# Patient Record
Sex: Female | Born: 1965 | Race: Black or African American | Hispanic: No | Marital: Single | Smoking: Current some day smoker
Health system: Southern US, Community
[De-identification: ages and names within clinical notes are randomized; demographics above are authoritative.]

## PROBLEM LIST (undated history)

## (undated) ENCOUNTER — Ambulatory Visit: Discharge status: Absent without leave | Payer: Medicare Other

## (undated) DIAGNOSIS — M1990 Unspecified osteoarthritis, unspecified site: Secondary | ICD-10-CM

## (undated) DIAGNOSIS — G8929 Other chronic pain: Secondary | ICD-10-CM

## (undated) DIAGNOSIS — G4700 Insomnia, unspecified: Secondary | ICD-10-CM

## (undated) DIAGNOSIS — R569 Unspecified convulsions: Secondary | ICD-10-CM

## (undated) DIAGNOSIS — J40 Bronchitis, not specified as acute or chronic: Secondary | ICD-10-CM

## (undated) DIAGNOSIS — R040 Epistaxis: Secondary | ICD-10-CM

## (undated) DIAGNOSIS — I10 Essential (primary) hypertension: Secondary | ICD-10-CM

## (undated) DIAGNOSIS — M25519 Pain in unspecified shoulder: Secondary | ICD-10-CM

## (undated) DIAGNOSIS — J45909 Unspecified asthma, uncomplicated: Secondary | ICD-10-CM

## (undated) HISTORY — PX: KNEE SURGERY: SHX244

## (undated) HISTORY — DX: Unspecified convulsions: R56.9

## (undated) HISTORY — PX: BUNIONECTOMY: SHX129

## (undated) HISTORY — PX: ABDOMINAL HYSTERECTOMY: SHX81

## (undated) HISTORY — DX: Insomnia, unspecified: G47.00

## (undated) HISTORY — PX: FOOT SURGERY: SHX648

## (undated) HISTORY — PX: TUMOR REMOVAL: SHX12

## (undated) HISTORY — PX: SHOULDER ARTHROSCOPY: SHX128

---

## 2001-06-11 ENCOUNTER — Encounter: Payer: Self-pay | Admitting: Emergency Medicine

## 2001-06-11 ENCOUNTER — Emergency Department (HOSPITAL_COMMUNITY): Admission: EM | Admit: 2001-06-11 | Discharge: 2001-06-11 | Payer: Self-pay | Admitting: Emergency Medicine

## 2001-12-02 ENCOUNTER — Encounter: Payer: Self-pay | Admitting: Emergency Medicine

## 2001-12-02 ENCOUNTER — Emergency Department (HOSPITAL_COMMUNITY): Admission: EM | Admit: 2001-12-02 | Discharge: 2001-12-02 | Payer: Self-pay | Admitting: Emergency Medicine

## 2002-01-04 ENCOUNTER — Emergency Department (HOSPITAL_COMMUNITY): Admission: EM | Admit: 2002-01-04 | Discharge: 2002-01-04 | Payer: Self-pay | Admitting: *Deleted

## 2002-05-05 ENCOUNTER — Emergency Department (HOSPITAL_COMMUNITY): Admission: EM | Admit: 2002-05-05 | Discharge: 2002-05-06 | Payer: Self-pay | Admitting: *Deleted

## 2002-05-06 ENCOUNTER — Encounter: Payer: Self-pay | Admitting: *Deleted

## 2002-10-03 ENCOUNTER — Emergency Department (HOSPITAL_COMMUNITY): Admission: EM | Admit: 2002-10-03 | Discharge: 2002-10-03 | Payer: Self-pay | Admitting: Emergency Medicine

## 2002-10-03 ENCOUNTER — Encounter: Payer: Self-pay | Admitting: Emergency Medicine

## 2002-10-18 ENCOUNTER — Emergency Department (HOSPITAL_COMMUNITY): Admission: EM | Admit: 2002-10-18 | Discharge: 2002-10-18 | Payer: Self-pay | Admitting: Emergency Medicine

## 2003-03-10 ENCOUNTER — Emergency Department (HOSPITAL_COMMUNITY): Admission: EM | Admit: 2003-03-10 | Discharge: 2003-03-10 | Payer: Self-pay | Admitting: Emergency Medicine

## 2004-04-20 ENCOUNTER — Emergency Department (HOSPITAL_COMMUNITY): Admission: EM | Admit: 2004-04-20 | Discharge: 2004-04-20 | Payer: Self-pay | Admitting: Emergency Medicine

## 2004-05-07 ENCOUNTER — Emergency Department (HOSPITAL_COMMUNITY): Admission: EM | Admit: 2004-05-07 | Discharge: 2004-05-07 | Payer: Self-pay | Admitting: Emergency Medicine

## 2004-09-15 ENCOUNTER — Emergency Department (HOSPITAL_COMMUNITY): Admission: EM | Admit: 2004-09-15 | Discharge: 2004-09-15 | Payer: Self-pay | Admitting: Emergency Medicine

## 2004-12-27 ENCOUNTER — Emergency Department (HOSPITAL_COMMUNITY): Admission: EM | Admit: 2004-12-27 | Discharge: 2004-12-27 | Payer: Self-pay | Admitting: Emergency Medicine

## 2005-04-28 ENCOUNTER — Emergency Department (HOSPITAL_COMMUNITY): Admission: EM | Admit: 2005-04-28 | Discharge: 2005-04-28 | Payer: Self-pay | Admitting: Emergency Medicine

## 2005-08-29 ENCOUNTER — Emergency Department (HOSPITAL_COMMUNITY): Admission: EM | Admit: 2005-08-29 | Discharge: 2005-08-29 | Payer: Self-pay | Admitting: Emergency Medicine

## 2006-04-10 ENCOUNTER — Emergency Department (HOSPITAL_COMMUNITY): Admission: EM | Admit: 2006-04-10 | Discharge: 2006-04-10 | Payer: Self-pay | Admitting: Emergency Medicine

## 2006-04-12 ENCOUNTER — Emergency Department (HOSPITAL_COMMUNITY): Admission: EM | Admit: 2006-04-12 | Discharge: 2006-04-12 | Payer: Self-pay | Admitting: Emergency Medicine

## 2006-06-14 ENCOUNTER — Emergency Department (HOSPITAL_COMMUNITY): Admission: EM | Admit: 2006-06-14 | Discharge: 2006-06-14 | Payer: Self-pay | Admitting: Emergency Medicine

## 2006-09-24 ENCOUNTER — Emergency Department (HOSPITAL_COMMUNITY): Admission: EM | Admit: 2006-09-24 | Discharge: 2006-09-24 | Payer: Self-pay | Admitting: Emergency Medicine

## 2006-09-26 ENCOUNTER — Emergency Department (HOSPITAL_COMMUNITY): Admission: EM | Admit: 2006-09-26 | Discharge: 2006-09-26 | Payer: Self-pay | Admitting: Emergency Medicine

## 2007-12-17 ENCOUNTER — Emergency Department (HOSPITAL_COMMUNITY): Admission: EM | Admit: 2007-12-17 | Discharge: 2007-12-17 | Payer: Self-pay | Admitting: Emergency Medicine

## 2008-01-19 ENCOUNTER — Encounter: Payer: Self-pay | Admitting: Orthopedic Surgery

## 2008-01-19 ENCOUNTER — Emergency Department (HOSPITAL_COMMUNITY): Admission: EM | Admit: 2008-01-19 | Discharge: 2008-01-19 | Payer: Self-pay | Admitting: Emergency Medicine

## 2008-01-23 ENCOUNTER — Telehealth: Payer: Self-pay | Admitting: Orthopedic Surgery

## 2008-01-23 ENCOUNTER — Ambulatory Visit: Payer: Self-pay | Admitting: Orthopedic Surgery

## 2008-01-23 DIAGNOSIS — M25469 Effusion, unspecified knee: Secondary | ICD-10-CM

## 2008-01-23 DIAGNOSIS — IMO0002 Reserved for concepts with insufficient information to code with codable children: Secondary | ICD-10-CM

## 2008-01-23 DIAGNOSIS — M23302 Other meniscus derangements, unspecified lateral meniscus, unspecified knee: Secondary | ICD-10-CM | POA: Insufficient documentation

## 2008-01-28 ENCOUNTER — Ambulatory Visit (HOSPITAL_COMMUNITY): Admission: RE | Admit: 2008-01-28 | Discharge: 2008-01-28 | Payer: Self-pay | Admitting: Orthopedic Surgery

## 2008-02-03 ENCOUNTER — Ambulatory Visit: Payer: Self-pay | Admitting: Orthopedic Surgery

## 2008-02-03 DIAGNOSIS — M545 Low back pain, unspecified: Secondary | ICD-10-CM | POA: Insufficient documentation

## 2008-02-03 DIAGNOSIS — D492 Neoplasm of unspecified behavior of bone, soft tissue, and skin: Secondary | ICD-10-CM | POA: Insufficient documentation

## 2008-02-04 ENCOUNTER — Ambulatory Visit (HOSPITAL_COMMUNITY): Admission: RE | Admit: 2008-02-04 | Discharge: 2008-02-04 | Payer: Self-pay | Admitting: Family Medicine

## 2008-02-07 ENCOUNTER — Ambulatory Visit (HOSPITAL_COMMUNITY): Admission: RE | Admit: 2008-02-07 | Discharge: 2008-02-07 | Payer: Self-pay | Admitting: Orthopedic Surgery

## 2008-02-07 ENCOUNTER — Encounter: Payer: Self-pay | Admitting: Orthopedic Surgery

## 2008-02-07 ENCOUNTER — Ambulatory Visit: Payer: Self-pay | Admitting: Orthopedic Surgery

## 2008-02-11 ENCOUNTER — Encounter (HOSPITAL_COMMUNITY): Admission: RE | Admit: 2008-02-11 | Discharge: 2008-03-12 | Payer: Self-pay | Admitting: Orthopedic Surgery

## 2008-02-11 ENCOUNTER — Ambulatory Visit: Payer: Self-pay | Admitting: Orthopedic Surgery

## 2008-02-11 DIAGNOSIS — M1710 Unilateral primary osteoarthritis, unspecified knee: Secondary | ICD-10-CM

## 2008-02-11 DIAGNOSIS — IMO0002 Reserved for concepts with insufficient information to code with codable children: Secondary | ICD-10-CM | POA: Insufficient documentation

## 2008-02-12 ENCOUNTER — Encounter: Payer: Self-pay | Admitting: Orthopedic Surgery

## 2008-02-17 ENCOUNTER — Ambulatory Visit (HOSPITAL_COMMUNITY): Admission: RE | Admit: 2008-02-17 | Discharge: 2008-02-17 | Payer: Self-pay | Admitting: Family Medicine

## 2008-02-19 ENCOUNTER — Ambulatory Visit: Payer: Self-pay | Admitting: Orthopedic Surgery

## 2008-02-25 ENCOUNTER — Telehealth: Payer: Self-pay | Admitting: Orthopedic Surgery

## 2008-03-03 ENCOUNTER — Ambulatory Visit (HOSPITAL_COMMUNITY): Admission: RE | Admit: 2008-03-03 | Discharge: 2008-03-03 | Payer: Self-pay | Admitting: Orthopedic Surgery

## 2008-03-13 ENCOUNTER — Encounter (HOSPITAL_COMMUNITY): Admission: RE | Admit: 2008-03-13 | Discharge: 2008-04-12 | Payer: Self-pay | Admitting: Orthopedic Surgery

## 2008-03-13 ENCOUNTER — Encounter: Payer: Self-pay | Admitting: Orthopedic Surgery

## 2008-03-24 ENCOUNTER — Ambulatory Visit: Payer: Self-pay | Admitting: Orthopedic Surgery

## 2008-03-25 ENCOUNTER — Encounter: Payer: Self-pay | Admitting: Orthopedic Surgery

## 2008-04-03 ENCOUNTER — Encounter: Payer: Self-pay | Admitting: Orthopedic Surgery

## 2008-04-13 ENCOUNTER — Encounter: Admission: RE | Admit: 2008-04-13 | Discharge: 2008-05-13 | Payer: Self-pay | Admitting: Orthopedic Surgery

## 2008-04-28 ENCOUNTER — Emergency Department (HOSPITAL_COMMUNITY): Admission: EM | Admit: 2008-04-28 | Discharge: 2008-04-28 | Payer: Self-pay | Admitting: Emergency Medicine

## 2008-05-01 ENCOUNTER — Encounter: Payer: Self-pay | Admitting: Orthopedic Surgery

## 2008-05-13 ENCOUNTER — Ambulatory Visit: Payer: Self-pay | Admitting: Orthopedic Surgery

## 2008-05-14 ENCOUNTER — Telehealth: Payer: Self-pay | Admitting: Orthopedic Surgery

## 2008-05-29 ENCOUNTER — Ambulatory Visit (HOSPITAL_COMMUNITY): Admission: RE | Admit: 2008-05-29 | Discharge: 2008-05-29 | Payer: Self-pay | Admitting: Orthopedic Surgery

## 2008-06-02 ENCOUNTER — Emergency Department (HOSPITAL_COMMUNITY): Admission: EM | Admit: 2008-06-02 | Discharge: 2008-06-02 | Payer: Self-pay | Admitting: Emergency Medicine

## 2008-06-03 ENCOUNTER — Ambulatory Visit: Payer: Self-pay | Admitting: Orthopedic Surgery

## 2008-06-03 DIAGNOSIS — M5126 Other intervertebral disc displacement, lumbar region: Secondary | ICD-10-CM

## 2008-06-11 ENCOUNTER — Other Ambulatory Visit: Admission: RE | Admit: 2008-06-11 | Discharge: 2008-06-11 | Payer: Self-pay | Admitting: Obstetrics and Gynecology

## 2008-06-26 ENCOUNTER — Ambulatory Visit (HOSPITAL_COMMUNITY): Admission: RE | Admit: 2008-06-26 | Discharge: 2008-06-26 | Payer: Self-pay | Admitting: Obstetrics and Gynecology

## 2008-08-03 ENCOUNTER — Inpatient Hospital Stay (HOSPITAL_COMMUNITY): Admission: RE | Admit: 2008-08-03 | Discharge: 2008-08-05 | Payer: Self-pay | Admitting: Obstetrics and Gynecology

## 2008-08-03 ENCOUNTER — Encounter: Payer: Self-pay | Admitting: Obstetrics and Gynecology

## 2008-08-09 ENCOUNTER — Emergency Department (HOSPITAL_COMMUNITY): Admission: EM | Admit: 2008-08-09 | Discharge: 2008-08-09 | Payer: Self-pay | Admitting: Emergency Medicine

## 2008-08-21 ENCOUNTER — Ambulatory Visit (HOSPITAL_COMMUNITY): Admission: RE | Admit: 2008-08-21 | Discharge: 2008-08-21 | Payer: Self-pay | Admitting: Obstetrics and Gynecology

## 2008-09-03 ENCOUNTER — Ambulatory Visit: Payer: Self-pay | Admitting: Orthopedic Surgery

## 2008-12-02 ENCOUNTER — Ambulatory Visit: Payer: Self-pay | Admitting: Orthopedic Surgery

## 2008-12-04 ENCOUNTER — Encounter (INDEPENDENT_AMBULATORY_CARE_PROVIDER_SITE_OTHER): Payer: Self-pay | Admitting: *Deleted

## 2008-12-22 ENCOUNTER — Encounter: Admission: RE | Admit: 2008-12-22 | Discharge: 2008-12-22 | Payer: Self-pay | Admitting: Orthopedic Surgery

## 2009-03-08 ENCOUNTER — Encounter: Admission: RE | Admit: 2009-03-08 | Discharge: 2009-03-08 | Payer: Self-pay | Admitting: Orthopedic Surgery

## 2009-03-17 ENCOUNTER — Ambulatory Visit: Payer: Self-pay | Admitting: Orthopedic Surgery

## 2009-03-17 DIAGNOSIS — M21619 Bunion of unspecified foot: Secondary | ICD-10-CM

## 2009-03-23 ENCOUNTER — Encounter: Admission: RE | Admit: 2009-03-23 | Discharge: 2009-03-23 | Payer: Self-pay | Admitting: Orthopedic Surgery

## 2009-03-24 ENCOUNTER — Telehealth: Payer: Self-pay | Admitting: Orthopedic Surgery

## 2009-03-24 ENCOUNTER — Encounter: Payer: Self-pay | Admitting: Orthopedic Surgery

## 2009-03-24 ENCOUNTER — Encounter (INDEPENDENT_AMBULATORY_CARE_PROVIDER_SITE_OTHER): Payer: Self-pay | Admitting: *Deleted

## 2009-03-26 ENCOUNTER — Telehealth (INDEPENDENT_AMBULATORY_CARE_PROVIDER_SITE_OTHER): Payer: Self-pay | Admitting: *Deleted

## 2009-04-29 ENCOUNTER — Emergency Department (HOSPITAL_COMMUNITY): Admission: EM | Admit: 2009-04-29 | Discharge: 2009-04-29 | Payer: Self-pay | Admitting: Emergency Medicine

## 2009-05-12 ENCOUNTER — Encounter: Payer: Self-pay | Admitting: Orthopedic Surgery

## 2009-05-16 ENCOUNTER — Emergency Department (HOSPITAL_COMMUNITY): Admission: EM | Admit: 2009-05-16 | Discharge: 2009-05-16 | Payer: Self-pay | Admitting: Emergency Medicine

## 2009-07-02 ENCOUNTER — Telehealth: Payer: Self-pay | Admitting: Orthopedic Surgery

## 2009-07-15 ENCOUNTER — Telehealth: Payer: Self-pay | Admitting: Orthopedic Surgery

## 2009-08-04 ENCOUNTER — Ambulatory Visit: Payer: Self-pay | Admitting: Orthopedic Surgery

## 2009-08-10 ENCOUNTER — Telehealth: Payer: Self-pay | Admitting: Orthopedic Surgery

## 2009-11-01 ENCOUNTER — Emergency Department (HOSPITAL_COMMUNITY): Admission: EM | Admit: 2009-11-01 | Discharge: 2009-11-01 | Payer: Self-pay | Admitting: Emergency Medicine

## 2009-11-15 ENCOUNTER — Emergency Department (HOSPITAL_COMMUNITY): Admission: EM | Admit: 2009-11-15 | Discharge: 2009-11-15 | Payer: Self-pay | Admitting: Emergency Medicine

## 2009-12-08 ENCOUNTER — Ambulatory Visit: Payer: Self-pay | Admitting: Orthopedic Surgery

## 2009-12-08 DIAGNOSIS — M25569 Pain in unspecified knee: Secondary | ICD-10-CM

## 2009-12-20 ENCOUNTER — Telehealth: Payer: Self-pay | Admitting: Orthopedic Surgery

## 2010-02-25 ENCOUNTER — Emergency Department (HOSPITAL_COMMUNITY): Admission: EM | Admit: 2010-02-25 | Discharge: 2010-02-25 | Payer: Self-pay | Admitting: Emergency Medicine

## 2010-03-05 ENCOUNTER — Emergency Department (HOSPITAL_COMMUNITY): Admission: EM | Admit: 2010-03-05 | Discharge: 2010-03-05 | Payer: Self-pay | Admitting: Emergency Medicine

## 2010-03-12 ENCOUNTER — Emergency Department (HOSPITAL_COMMUNITY): Admission: EM | Admit: 2010-03-12 | Discharge: 2010-03-12 | Payer: Self-pay | Admitting: Emergency Medicine

## 2010-03-24 ENCOUNTER — Emergency Department (HOSPITAL_COMMUNITY): Admission: EM | Admit: 2010-03-24 | Discharge: 2010-03-24 | Payer: Self-pay | Admitting: Emergency Medicine

## 2010-07-04 ENCOUNTER — Telehealth: Payer: Self-pay | Admitting: Orthopedic Surgery

## 2010-07-04 ENCOUNTER — Ambulatory Visit: Payer: Self-pay | Admitting: Orthopedic Surgery

## 2010-10-09 ENCOUNTER — Encounter: Payer: Self-pay | Admitting: Orthopedic Surgery

## 2010-10-10 ENCOUNTER — Encounter: Payer: Self-pay | Admitting: Obstetrics and Gynecology

## 2010-10-18 NOTE — Assessment & Plan Note (Signed)
Summary: [facility] er rt knee pain xr there/medicaid/[facility]innis/bsf   Visit Type:  Follow-up Referring Provider:  [facility]  CC:  right knee pain.  History of Present Illness: 45 years old black female recently seen in the ER for acute onset of severe knee pain rated at 10 associated with swelling and giving way of the RIGHT leg.  She is artery had previous x-rays and an MRI of the knee and showed just some chondromalacia patella no meniscal or ligament injuries  She also has had MRI of the back and has had epidural injections.  She presents now for followup complaining of anterior knee pain and joint effusion which is getting better with ice packs and knee brace and protected weightbearing  Xrays at [facility] on 11-15-09. Joint effusion and mild degenerative changes.  Medications: updated.    Allergies (verified): 1)  ! Pcn  Past History:  Past Medical History: Last updated: 01/23/2008 asthma bronchitis.  Past Surgical History: 3 c sections breast surgery right milk duct clogged surgery on left ankle/keeling put screws in and Romeo Apple took them out.  LEFT bunionectomy  Physical Exam  Additional Exam:    GEN: appearance was normal   CDV: normal pulses temperature and no edema  LYMPH nodes were normal   SKIN was normal   Neuro: normal sensation Psyche: AAO x 3 and mood was normal   MSK She was ambulating abnormally with the brace and crutches  She had crepitus on range of motion of the knee there is a small joint effusion.  She had full range of motion.  Her motor strength is grade 5.  All ligaments in the knee were stable.      Impression & Recommendations:  Problem # 1:  PATELLO-FEMORAL SYNDROME (ICD-719.46) Assessment Deteriorated  date of ER report and notes confirm the patient's history of 10 out of 10 pain acute in onset they gave her some hydrocodone ibuprofen and sent her here for followup since she's been here before she had a new set of x-rays x4 which  showed joint effusion no acute injury I reviewed the notes and the x-ray.  Inject RIGHT knee.  Verbal consent was obtained. The knee was prepped with alcohol and ethyl chloride. 1 cc of depomedrol 40mg /cc and 4 cc of lidocaine 1% was injected. there were no complications.  The following medications were removed from the medication list:    Ultracet 37.5-325 Mg Tabs (Tramadol-acetaminophen) .Marland Kitchen... 1 by mouth q 4 as needed pain Her updated medication list for this problem includes:    Vicodin 5-500 Mg Tabs (Hydrocodone-acetaminophen) .Marland Kitchen... 1 by mouth q 4 as needed  Orders: Est. Patient Level IV ([address]109) Joint Aspirate / Injection, Large (20610) Depo- Medrol 40mg  (J1030)  Patient Instructions: 1)  You have received an injection of cortisone today. You may experience increased pain at the injection site. Apply ice pack to the area for 20 minutes every 2 hours and take 2 xtra strength tylenol every 8 hours. This increased pain will usually resolve in 24 hours. The injection will take effect in 3-10 days.  2)  Ice to the right knee as needed  3)  Please schedule a follow-up appointment as needed. Prescriptions: NEURONTIN 100 MG CAPS (GABAPENTIN) 1 by mouth three times a day  #120 x 2   Entered and Authorized by:   Fuller Canada MD   Signed by:   Fuller Canada MD on 12/08/2009   Method used:   Print then Give to Patient   RxID:  [id number] VICODIN 5-500 MG TABS (HYDROCODONE-ACETAMINOPHEN) 1 by mouth q 4 as needed  #60 x 5   Entered and Authorized by:   Fuller Canada MD   Signed by:   Fuller Canada MD on 12/08/2009   Method used:   Print then Give to Patient   RxID:   [id number]

## 2010-10-18 NOTE — Assessment & Plan Note (Signed)
Summary: BILAT KNEE PAIN/NO RECENT XRAYS/REF [facility]/MEDICARE,MCD/CAF   Visit Type:  Follow-up Referring Provider:  [facility]  CC:  knee pain.  History of Present Illness: I saw Kirsten Walls in the office today for a followup visit.  She is a 45 years old woman with the complaint of:  bilateral knee pain, and back pain with radicular pain in both legs.  Patient had biopsy, microfracture, medial femoral condyle, LEFT knee in 2009.  She was worked over the MRI and had epidural injections for degenerative disc disease.  Presents back at the request of her primary care physician for evaluation of bilateral knee pain.  In May was seen for right knee and was given an injection for patellofemoral syndrome and it helped.  the patient is requesting an electric powered scooter or electric wheelchair because of frequent falls.            Allergies: 1)  ! Pcn  Physical Exam  Additional Exam:  bilateral examination. Patient ambulates with a cane. Both knees are examined. She has no crepitance on range of motion, has full passive range of motion without pain. Joint lines are nontender. Ligaments are stable. Muscle exam. Motor strength is grade 5.  She has bilateral pain with straight leg raise. She has a positive Lasegue's sign bilaterally. She has back pain with back tenderness.     Impression & Recommendations:  Problem # 1:  KNEE, ARTHRITIS, DEGEN./OSTEO (ICD-715.96) Assessment Unchanged  we have reevaluated her for arthritis of the knee. Although she has some mild changes and they do not match her clinical symptoms or complaints.  She does not need any more treatment for her knee at this time.  Recommend neurosurgical followup.  Recommend no further narcotic medication.   The following medications were removed from the medication list:    Vicodin 5-500 Mg Tabs (Hydrocodone-acetaminophen) .Marland Kitchen... 1 by mouth q 4 as needed  Orders: Est. Patient Level III  (84132)  Patient Instructions: 1)  NSURGERY APPT  please give her Dr. Verlee Rossetti number, [facility] 2)  Please schedule a follow-up appointment as needed.   Orders Added: 1)  Est. Patient Level III [44010]

## 2010-10-18 NOTE — Progress Notes (Signed)
Summary: call from patient about Rx  Phone Note Call from Patient   Caller: Patient Summary of Call: Patient called to relay that she had surgery by Dr Pricilla Holm, [facility], today. States she was given a prescription by Dr Pricilla Holm and was told by [facility] that our office would have to re-authorize the refill after she was finished with Rx from Dr Pricilla Holm.    Said Dr Romeo Apple wrote Hydrocodone 5/60 tablets Pt phone [phone] Initial call taken by: Cammie Sickle,  December 20, 2009 1:54 PM  Follow-up for Phone Call        where is the pain, if the pain is foot get from foot doctor, if for back back dr for knee send [place] a request Follow-up by: Ether Griffins,  December 20, 2009 2:19 PM  Additional Follow-up for Phone Call Additional follow up Details #1::        Advised Additional Follow-up by: Cammie Sickle,  December 21, 2009 12:37 PM

## 2010-10-18 NOTE — Progress Notes (Signed)
Summary: patient asked fol'g her visit about pain medication refills  Phone Note Call from Patient   Caller: Patient Summary of Call: Patient came out to desk following office visit today asking for her 2 prescription medications from [facility] for her knee pain medications.  (I advised to always address while she is back in the exam room and also asked if her PCP is mananging medications.  Her ride has just come to pick her up - she has left.  Please advise.  Initial call taken by: Cammie Sickle,  July 04, 2010 11:27 AM  Follow-up for Phone Call        NO MORE PAIN MEDS Follow-up by: Fuller Canada MD,  July 04, 2010 11:30 AM  Additional Follow-up for Phone Call Additional follow up Details #1::        Called patient [phone] and left voice mail message to return call. Additional Follow-up by: Cammie Sickle,  July 04, 2010 1:03 PM    Additional Follow-up for Phone Call Additional follow up Details #2::    thanks Follow-up by: Ether Griffins,  July 04, 2010 1:09 PM

## 2010-10-18 NOTE — Letter (Signed)
Summary: *Orthopedic Consult Note  [facility]  [address]  [place], [place] [address]   Phone: [phone]  Fax: [phone]    Re:    Kirsten Walls DOB:    [birth year]   Dear: Thalia Party:    Thank you for requesting that we see the above patient for consultation.  A copy of the detailed office note will be sent under separate cover, for your review.  Evaluation today is consistent with: bilateral leg pain associated with lumbar disc disease. Her knees bother her but in no way does her clinical exam of her knee match her symptoms.  I have advised her to see a neurosurgeon to further evaluate her for bilateral leg pain with radiation into her feet and frequent falls.  Sincerely,   Terrance Mass. MD.

## 2010-11-25 ENCOUNTER — Emergency Department (HOSPITAL_COMMUNITY): Payer: Medicare Other

## 2010-11-25 ENCOUNTER — Emergency Department (HOSPITAL_COMMUNITY)
Admission: EM | Admit: 2010-11-25 | Discharge: 2010-11-25 | Disposition: A | Discharge status: Home / Self Care | Payer: Medicare Other | Attending: Emergency Medicine | Admitting: Emergency Medicine

## 2010-11-25 DIAGNOSIS — S8990XA Unspecified injury of unspecified lower leg, initial encounter: Secondary | ICD-10-CM | POA: Insufficient documentation

## 2010-11-25 DIAGNOSIS — M79609 Pain in unspecified limb: Secondary | ICD-10-CM | POA: Insufficient documentation

## 2010-11-25 DIAGNOSIS — S99929A Unspecified injury of unspecified foot, initial encounter: Secondary | ICD-10-CM | POA: Insufficient documentation

## 2010-11-25 DIAGNOSIS — Y92009 Unspecified place in unspecified non-institutional (private) residence as the place of occurrence of the external cause: Secondary | ICD-10-CM | POA: Insufficient documentation

## 2010-11-25 DIAGNOSIS — S97109A Crushing injury of unspecified toe(s), initial encounter: Secondary | ICD-10-CM | POA: Insufficient documentation

## 2010-11-25 DIAGNOSIS — J45909 Unspecified asthma, uncomplicated: Secondary | ICD-10-CM | POA: Insufficient documentation

## 2010-11-25 DIAGNOSIS — W1809XA Striking against other object with subsequent fall, initial encounter: Secondary | ICD-10-CM | POA: Insufficient documentation

## 2010-12-04 LAB — COMPREHENSIVE METABOLIC PANEL
ALT: 26 U/L (ref 0–35)
Alkaline Phosphatase: 99 U/L (ref 39–117)
BUN: 5 mg/dL — ABNORMAL LOW (ref 6–23)
CO2: 24 mEq/L (ref 19–32)
Calcium: 9.3 mg/dL (ref 8.4–10.5)
Chloride: 102 mEq/L (ref 96–112)
GFR calc non Af Amer: >60 mL/min (ref >60)
Glucose, Bld: 99 mg/dL (ref 70–99)
Sodium: 134 mEq/L — ABNORMAL LOW (ref 135–145)
Total Bilirubin: 0.3 mg/dL (ref 0.3–1.2)

## 2010-12-04 LAB — URINALYSIS, ROUTINE W REFLEX MICROSCOPIC
Bilirubin Urine: NEGATIVE
Glucose, UA: NEGATIVE mg/dL
Hgb urine dipstick: NEGATIVE
Protein, ur: NEGATIVE mg/dL
Urobilinogen, UA: 0.2 mg/dL (ref 0.0–1.0)

## 2010-12-04 LAB — CLOSTRIDIUM DIFFICILE EIA

## 2010-12-04 LAB — STOOL CULTURE

## 2010-12-05 LAB — RAPID STREP SCREEN (MED CTR MEBANE ONLY): Streptococcus, Group A Screen (Direct): NEGATIVE

## 2011-01-31 NOTE — Op Note (Signed)
Kirsten Walls, Kirsten Walls NO.:  [id number]   MEDICAL RECORD NO.:  [id number]          PATIENT TYPE:  INP   LOCATION:  A334                          FACILITY:  [facility]   PHYSICIAN:  Tilda Burrow, M.D. DATE OF BIRTH:  1966/01/14   DATE OF PROCEDURE:  08/03/2008  DATE OF DISCHARGE:                               OPERATIVE REPORT   PREOPERATIVE DIAGNOSES:  1. Uterine fibroids.  2. Menorrhalgia.  3. Anemia.   POSTOPERATIVE DIAGNOSES:  1. Uterine fibroids.  2. Menorrhalgia.  3. Anemia.   PROCEDURE:  Vaginal hysterectomy with left salpingo-oophorectomy.   SURGEON:  Tilda Burrow, M.D.   ASSISTANT:  1Amie Critchley, CST  2. Morrie Sheldon, RNFA   ANESTHESIA:  General by our CRNA.   COMPLICATIONS:  1. Technically challenging hysterectomy due to fundal fibroid      requiring morcellation.  2. Oozing from the base of the left ovary necessitating removal of      left ovary.   DETAILS OF PROCEDURE:  The patient was taken to the operating room,  prepped and draped in the usual fashion.  A vaginal procedure with lower  lithotomy leg support position and 3-inch weighted speculum was placed  in the posterior vaginal cervix, circumscribed with lidocaine solution  was containing epinephrine x10 mL and then the cervix circumscribed from  8 o'clock to 4 o'clock with a Bovie cautery so that the bladder flap can  be elevated anteriorly.  Posterior colpotomy incision was performed  easily identifying the cul-de-sac.  Uterosacral ligaments were clamped  with the curved Zeppelin clamp, transected and suture ligated and tagged  using 0-chromic suture.  The lower cardinal ligaments were taken down in  small bites on either side.  Clamping, cutting, and then suture ligating  with 0-chromic.  Bladder could be pushed up progressively with each  releasing of pedicles that had been tagged and tied.  Upper cardinal  ligaments were clamped, cut, and suture ligated again with multiple  small  bites with 0-chromic.  Upon reaching the level of the broad  ligament, we were able to have the bladder retracted out of the way and  attempted to have the anterior peritoneum surface of the uterus.  Much  of broad ligament was taken down the uterine vessels to see we  accomplished hemostasis through this portion of the case.  We notched up  the right side of broad ligament almost to the round ligament and to a  slightly lesser degree on the left side taking small amounts of cutting  and suture ligated.  At this time, we obtained  __________ taken out  intact so morcellation was performed.  First, sectioning in the uterus  involved sharp dissection of the cervix and lower uterine segment  leaving the upper portion of the uterus intact with a V shaped wedge in  the middle of the uterus taken out.  This allows to identify the lower  portion of the endometrial cavity.  The uterus could then be rotated  inside the abdominal cavity and holding the bladder and vaginal side  wall with Deaver retractors, we were able to clamp,  cut, and suture  ligate around the left fallopian tube.  Utero-ovarian ligament and round  ligament complex, again using small bites.  The tagging was performed on  the side as well.  The right side of the uterus was taken and reduced in  volume by morcellation.  One to two sections of the right side of the  uterus could be taken down, the fundal fibroids could be identified and  grasped.  Fibroids measured 5 cm and found to round and inflexible.  It  is necessary to peel the fibroid out of the fundal portion of the uterus  just to the left of the midline in order to extricate the fibroid.  Once  this was performed, the remaining small portion of the uterine fundus  was somewhat sloughy and full and with increased venous oozing.  The  uterus was inverted back into its anatomic position and grasped with a  Zeppelin clamp, and efforts made to achieve hemostasis.  The left  ovary  was visible above the fundal portion uterus but unfortunately it was  bleeding salpinx sufficiently that it was necessary to take out the left  tube and ovary.  We were able to stay closed to left ovary and the  midline, also clamping, cutting and ligation.  Hemostasis was quite  good, once this portion of the case can be performed.  There was  significant amount of blood loss during the morcellation of the fundal  fibroid and the reorientation of the uterus.  Once this had been  performed there was still the left round ligament to be taken down and  this was clamped, cut, and suture ligated close to the uterus.  This was  tagged for orientation.  Continue dissection revealed that there was  only oozing from the posterior cuff.  Sponge sticks were used to elevate  the bowel out adequate to grasp the right ovary with Babcock clamp, this  could be retracted down adequately and we could see there was no  bleeding especially with right adnexal structures.  Then small tag of  omentum noted and attached to the fundus and uterus which had been  bluntly dissected off the uterus.  This was inspected and confirmed  hemostatic.  Omentum was free of any suspicious reentry to the internal  bowel.  A moistened 2-inch vaginal tape had been placed prior to the  morcellation process to keep the bowel away from the surgical field.   Once a single stitch was placed in the lower cardinal ligament also on  the left side to redo one stitch which had come loose, the hemostasis  was quite good.  At this moment, a suture of 2-0 Prolene was placed into  the medial third of the uterosacral ligament on each side allowed to run  loosely through the subperitoneal tissues of the pouch of Douglas and  tagged.  The anterior bladder flap remain identified and pulled down.  There was some bruising associated with the dissection, and we decided  not to reattach the bladder flap posteriorly.  The vaginal cuff was  then  closed with a series of interrupted 0 Vicryl sutures front to back with  good tissue edge approximation.  Before closing the cuff, the 2-0  Prolene suture was loosely tied in the midline and the knot buried well  inside the abdominal cavity.  At the end of the procedure, a Foley  catheter was inserted revealing general clear urine, 250 mL with no  bleeding.  The patient  went to recovery room in stable condition with  sponge and needle counts correct.  Postoperative hemoglobin will be  checked in the OR due to the bleeding during the morcellation process.      Tilda Burrow, M.D.  Electronically Signed     JVF/MEDQ  D:  08/03/2008  T:  08/04/2008  Job:  [id number]

## 2011-01-31 NOTE — H&P (Signed)
NAME:  Kirsten Walls, Kirsten Walls NO.:  [id number]   MEDICAL RECORD NO.:  [id number]          PATIENT TYPE:  AMB   LOCATION:  DAY                           FACILITY:  [facility]   PHYSICIAN:  Tilda Burrow, M.D. DATE OF BIRTH:  01-01-1966   DATE OF ADMISSION:  DATE OF DISCHARGE:  LH                              HISTORY & PHYSICAL   ADMITTING DIAGNOSES:  1. Uterine fibroids.  2. Dysmenorrhea.  3. Menorrhagia.   HISTORY OF PRESENT ILLNESS:  This 45 year old female is sent from our  office to [facility] for admission for vaginal hysterectomy.  She has a sense of pelvic pressure and pain associated with uterine  fibroids.  She has a single dominant fibroid in the fundal portion of  the uterus.  The uterus is estimated at approximately 250 g uterine  size, symmetric in position.  She has short functional vaginal length,  and even with this uterine size, it is felt that hysterectomy performed  vaginally possibly requiring morcellation is possible.  The patient has  been counseled over vaginal procedure, which she strongly desires.  We  have made her aware that in the event of technical challenges or  limitations, that converting to an abdominal hysterectomy is  possibility.  Video photographs of hysterectomy have been supplied to  the patient.   PAST MEDICAL HISTORY:  Benign.   SURGICAL HISTORY:  Tubal ligation, D&C, cervical biopsies in the past by  Dr. Daphine Deutscher.   ALLERGIES:  __________.   MEDICATIONS:  None.   PHYSICAL EXAMINATION:  VITAL SIGNS:  Height 5 feet 4 inches and weight  140.  GENERAL:  Generally healthy, athletic-appearing African American female,  alert and oriented x3.  HEENT:  Pupils equal, round, and reactive to light.  NECK:  Supple.  CHEST:  Clear to auscultation.  ABDOMEN:  Slim.  MUSCULOSKELETAL:  Without masses.  EXTERNAL GENITALIA:  Normal.  Multiparous female.  Short vaginal length.  Cervix easily accessible.  Uterus 10-week size,  mobile, centrally  located with fundal fibroid present and limits of the fibroid easily  palpable due to the patient's slim body habitus.  EXTREMITIES:  Grossly normal without cyanosis, clubbing, or edema.   LABORATORY DATA:  From May 2009 shows hemoglobin 10, hematocrit 33,  white count normal; BUN 11, creatinine 0.8; with other electrolytes  normal.  Cholesterol is 153 with good cholesterol 71 and LDL cholesterol  66.   PLAN:  Vaginal hysterectomy on August 03, 2008.      Tilda Burrow, M.D.  Electronically Signed     JVF/MEDQ  D:  07/31/2008  T:  08/01/2008  Job:  [id number]

## 2011-01-31 NOTE — Op Note (Signed)
NAME:  Kirsten Walls, TEJADA NO.:  [id number]   MEDICAL RECORD NO.:  [id number]          PATIENT TYPE:  AMB   LOCATION:  DAY                           FACILITY:  [facility]   PHYSICIAN:  Vickki Hearing, M.D.DATE OF BIRTH:  08-21-1966   DATE OF PROCEDURE:  02/07/2008  DATE OF DISCHARGE:                               OPERATIVE REPORT   HISTORY:  This is a 45 year old female with a complaint of left knee  pain.  She says she woke up one morning and the knee was swollen.  Since  that time, she has had persistent swelling and giving out of the left  knee.  She was treated with an immobilizer and crutches and Vicodin for  pain.  An MRI was obtained that showed a large lesion just below the  inferior pole of patella which could represent possible loose body  versus PVNS.  There was also strain pattern in the popliteus tendon.  The patient was advised to have a arthroscopy to biopsy the lesion.   PREOPERATIVE DIAGNOSIS:  Mass, left knee.   POSTOPERATIVE DIAGNOSIS:  Mass, left knee.   SECONDARY PREOPERATIVE DIAGNOSIS:  Chondral lesion, medial femoral  condyle, grade 4.   SECONDARY POSTOPERATIVE DIAGNOSIS:  Chondral lesion, medial femoral  condyle, grade 4.   PROCEDURE:  1. Biopsy, mass, left knee.  2. Microfracture, medial femoral condyle.   SURGEON:  Vickki Hearing, MD   ASSISTANT:  No assistants.   ANESTHESIA:  LMA general.   OPERATIVE FINDINGS:  There was a large mass approximately 4 x 2 cm at  the inferior pole of patella and suprapatellar pouch.  On gross  inspection, it was firm with synovial covering and size as stated.   Secondary finding was grade 4 chondral lesion of the medial femoral  condyle.  Menisci intact, medial and lateral.  ACL intact.  Patellofemoral cartilage intact.   DETAILS OF PROCEDURE:  The patient was identified as [name].  The left knee was marked for surgery, countersigned it, and her history  and physical was updated.   She was taken to surgery after LMA general  anesthetic and prepped the left knee.  Vancomycin was started.  Time-out  procedure was completed.   Standard arthroscopy portal was established laterally.  Diagnostic  arthroscopy was completed.  Accessory medial parapatellar portal was  made with a spinal needle and a 11 blade and then a scissors punch was  used to remove the lesion from its synovial stalk and excised it and it  was removed from the medial accessory portal.   An inferomedial portal was then established.  The chondral lesion was  debrided down to a stable rim.  This grade 4 lesion was then treated  with a chondral pick technique, so called microfracture.   Knee was irrigated.  Debris was suctioned from the knee.  The knee was  closed with 3-0 nylon suture injected with 20 mL of Marcaine with  epinephrine.  Sterile dressings were applied with a Cryo/Cuff.  Specimen  time-out was completed.  The patient was extubated and taken to recovery  room in stable condition.  POSTOPERATIVE PLAN:  Nonweightbearing for 6 weeks, immediate range of  motion, and biopsy results to be reported to the patient in the office.      Vickki Hearing, M.D.  Electronically Signed     SEH/MEDQ  D:  02/07/2008  T:  02/08/2008  Job:  [id number]

## 2011-01-31 NOTE — Discharge Summary (Signed)
NAMECARMELLA, KEES NO.:  [id number]   MEDICAL RECORD NO.:  [id number]          PATIENT TYPE:  INP   LOCATION:  A334                          FACILITY:  [facility]   PHYSICIAN:  Tilda Burrow, M.D. DATE OF BIRTH:  August 03, 1966   DATE OF ADMISSION:  08/03/2008  DATE OF DISCHARGE:  11/18/2009LH                               DISCHARGE SUMMARY   ADMITTING DIAGNOSES:  1. Uterine fibroids.  2. Dysmenorrhea.  3. Menorrhagia.   DISCHARGE DIAGNOSES:  1. Uterine fibroids.  2. Dysmenorrhea.  3. Menorrhagia.   PROCEDURE:  Vaginal hysterectomy with morcellation of uterus on August 03, 2008 with left salpingo-oophorectomy.   DISCHARGE MEDICATIONS:  1. Vicodin 5/500 one p.o. q.4-6 h. p.r.n. pain.  2. Metronidazole 500 p.o. twice daily x7 days.  3. Gabapentin 100 mg p.o. t.i.d.  4. Ibuprofen 800 mg 3 times daily p.r.n.  5. Prednisone 5 mg p.o. daily.   HOSPITAL SUMMARY:  This 45 year old female with large fibroid uterus,  estimated 250 g, was admitted for vaginal hysterectomy.  With the  patient aware of the possible need for abdominal hysterectomy, should  technical limitations preclude continued vaginal surgery.   HOSPITAL COURSE:  The patient was admitted, underwent a vaginal  hysterectomy, which was technically challenging due to fundal fibroid  requiring morcellation prior to taking out the fundus of the uterus,  oozing from the base of the left ovary necessitating removal of that  same ovary.   The patient was admitted and underwent vaginal hysterectomy which  required morcellation of the uterus.  There was an approximately 220-g  uterine weight.  Pathology was benign secretory endometrium.   Technically challenging surgery due to the large fundal fibroid required  morcellation of the uterus in multiple pieces.  Postoperative hemoglobin  was actually quite good with hemoglobin 8.9, hematocrit 28.3 on postop  day #1.  Her electrolytes are normal; white count  was 18,300 and 77 neutrophils.  Following day, the patient's hemoglobin 7.8, hematocrit 24, and white  count 9500.  The patient is tolerating regular diet, active bowel  sounds, normal pitched, and she was discharged home at that time.      Tilda Burrow, M.D.  Electronically Signed     JVF/MEDQ  D:  08/12/2008  T:  08/13/2008  Job:  [id number]

## 2011-06-14 LAB — DIFFERENTIAL
Eosinophils Absolute: 0
Eosinophils Relative: 1
Lymphs Abs: 2.4
Monocytes Absolute: 0.6
Monocytes Relative: 8

## 2011-06-14 LAB — BASIC METABOLIC PANEL
BUN: 8
CO2: 30
Chloride: 102
GFR calc Af Amer: >60
Potassium: 4.6

## 2011-06-14 LAB — CBC
HCT: 33 — ABNORMAL LOW
MCHC: 34.3
MCV: 81.2
RBC: 4.06
WBC: 7.4

## 2011-06-20 LAB — DIFFERENTIAL
Basophils Absolute: 0.1
Basophils Relative: 0
Eosinophils Absolute: 0
Lymphocytes Relative: 29
Monocytes Absolute: 1
Neutro Abs: 5.5
Neutrophils Relative %: 58
Neutrophils Relative %: 77

## 2011-06-20 LAB — COMPREHENSIVE METABOLIC PANEL
AST: 21
BUN: 14
CO2: 30
Chloride: 104
Creatinine, Ser: 0.98
GFR calc non Af Amer: >60
Glucose, Bld: 75
Total Bilirubin: 0.4

## 2011-06-20 LAB — BASIC METABOLIC PANEL
BUN: 3 — ABNORMAL LOW
BUN: 7
CO2: 30
Calcium: 8 — ABNORMAL LOW
Chloride: 104
Chloride: 106
Creatinine, Ser: 0.77
Creatinine, Ser: 0.93
GFR calc Af Amer: >60
GFR calc non Af Amer: >60
GFR calc non Af Amer: >60
Glucose, Bld: 103 — ABNORMAL HIGH
Glucose, Bld: 121 — ABNORMAL HIGH
Glucose, Bld: 81
Potassium: 4.1
Sodium: 134 — ABNORMAL LOW

## 2011-06-20 LAB — CBC
HCT: 30.9 — ABNORMAL LOW
Hemoglobin: 7.8 — CL
MCHC: 31.5
MCV: 77.8 — ABNORMAL LOW
Platelets: 190
Platelets: 305
RBC: 3.17 — ABNORMAL LOW
RDW: 19.5 — ABNORMAL HIGH
RDW: 19.8 — ABNORMAL HIGH
RDW: 20.1 — ABNORMAL HIGH

## 2011-06-20 LAB — HEMOGLOBIN AND HEMATOCRIT, BLOOD: Hemoglobin: 9.2 — ABNORMAL LOW

## 2011-06-20 LAB — TYPE AND SCREEN
ABO/RH(D): A POS
Antibody Screen: NEGATIVE

## 2011-06-20 LAB — HCG, QUANTITATIVE, PREGNANCY: hCG, Beta Chain, Quant, S: <2

## 2011-08-11 ENCOUNTER — Emergency Department (HOSPITAL_COMMUNITY): Payer: Medicare Other

## 2011-08-11 ENCOUNTER — Emergency Department (HOSPITAL_COMMUNITY)
Admission: EM | Admit: 2011-08-11 | Discharge: 2011-08-11 | Disposition: A | Discharge status: Home / Self Care | Payer: Medicare Other | Attending: Emergency Medicine | Admitting: Emergency Medicine

## 2011-08-11 ENCOUNTER — Encounter: Payer: Self-pay | Admitting: *Deleted

## 2011-08-11 DIAGNOSIS — M25519 Pain in unspecified shoulder: Secondary | ICD-10-CM | POA: Insufficient documentation

## 2011-08-11 MED ORDER — IBUPROFEN 800 MG PO TABS
800.0000 mg | ORAL_TABLET | Freq: Once | ORAL | Status: AC
  Administered 2011-08-11: 800 mg via ORAL
  Filled 2011-08-11: qty 1

## 2011-08-11 MED ORDER — HYDROCODONE-ACETAMINOPHEN 5-325 MG PO TABS
1.0000 | ORAL_TABLET | Freq: Once | ORAL | Status: AC
  Administered 2011-08-11: 1 via ORAL
  Filled 2011-08-11: qty 1

## 2011-08-11 MED ORDER — IBUPROFEN 800 MG PO TABS: 800.0000 mg | ORAL_TABLET | Freq: Three times a day (TID) | ORAL | Status: AC

## 2011-08-11 MED ORDER — HYDROCODONE-ACETAMINOPHEN 5-325 MG PO TABS: 1.0000 | ORAL_TABLET | ORAL | Status: AC | PRN

## 2011-08-11 NOTE — ED Notes (Signed)
Pt c/o right shoulder pain x 2 months; pt denies any injury

## 2011-08-11 NOTE — ED Notes (Signed)
Patient with no complaints at this time. Respirations even and unlabored. Skin warm/dry. Discharge instructions reviewed with patient at this time. Patient given opportunity to voice concerns/ask questions. Patient discharged at this time and left Emergency Department with steady gait.   

## 2011-08-12 NOTE — ED Provider Notes (Signed)
History     CSN: [id number] Arrival date & time: 08/11/2011  6:07 PM   First MD Initiated Contact with Patient 08/11/11 1820      Chief Complaint  Patient presents with  . Shoulder Pain    right    (Consider location/radiation/quality/duration/timing/severity/associated sxs/prior treatment) HPI Comments: Kirsten Walls is a 45 y.o. female who presents to the Emergency Department complaining of right shoulder pain for two months that is getting worse. No known injury. Pain is to the top of the shoulder and is worse with lifting the arm. She has taken tylenol and aleve with no relief. Pain is continuous and does not change from day to night. She has seen her PCP, Dr. Renard Matter who suggested sleeping on the othe side however that has not relieved her discomfort. She denies numbness, tingling, weakness.  Patient is a 45 y.o. female presenting with shoulder pain.  Shoulder Pain    History reviewed. No pertinent past medical history.  Past Surgical History  Procedure Date  . Cesarean section x2  . Bunionectomy both big toe  . Abdominal hysterectomy   . Tumor removal left knee cap    History reviewed. No pertinent family history.  History  Substance Use Topics  . Smoking status: Current Everyday Smoker  . Smokeless tobacco: Not on file  . Alcohol Use: No    OB History    Grav Para Term Preterm Abortions TAB SAB Ect Mult Living                  Review of Systems  All other systems reviewed and are negative.    Allergies  Penicillins  Home Medications   Current Outpatient Rx  Name Route Sig Dispense Refill  . ALBUTEROL SULFATE HFA 108 (90 BASE) MCG/ACT IN AERS Inhalation Inhale 2 puffs into the lungs every 6 (six) hours as needed. For shortness of breath/asthma/bronchitis     . ALPRAZOLAM 0.5 MG PO TABS Oral Take 0.5 mg by mouth at bedtime as needed. For sleep     . OXYMETAZOLINE HCL 0.05 % NA SOLN Nasal Place 2 sprays into the nose 3 (three) times daily.      Marland Kitchen  PANTOPRAZOLE SODIUM 40 MG PO TBEC Oral Take 40 mg by mouth 2 (two) times daily.      Marland Kitchen HYDROCODONE-ACETAMINOPHEN 5-325 MG PO TABS Oral Take 1 tablet by mouth every 4 (four) hours as needed for pain. 15 tablet 0  . IBUPROFEN 800 MG PO TABS Oral Take 1 tablet (800 mg total) by mouth 3 (three) times daily. 21 tablet 0    BP 137/83  Pulse 64  Temp(Src) 98.5 F (36.9 C) (Oral)  Resp 20  Ht 5\' 2"  (1.575 m)  Wt 163 lb (73.936 kg)  BMI 29.81 kg/m2  SpO2 100%  Physical Exam  Nursing note and vitals reviewed. Constitutional: She is oriented to person, place, and time. She appears well-developed and well-nourished. No distress.  HENT:  Head: Normocephalic and atraumatic.  Eyes: EOM are normal.  Neck: Normal range of motion. Neck supple.  Cardiovascular: Normal rate, normal heart sounds and intact distal pulses.   Pulmonary/Chest: Breath sounds normal.  Musculoskeletal:       FROM at right wrist and elbow. Shoulder with focal tenderness to palpation, unable to abduct without significant pain. No crepitus, no deformity noted. Pulses 2+, sensation to arm and hand normal.  Neurological: She is alert and oriented to person, place, and time. She has normal reflexes.  Skin: Skin is  warm and dry.    ED Course  Procedures (including critical care time)  Labs Reviewed - No data to display Dg Shoulder Right  08/11/2011  **ADDENDUM** CREATED: 08/11/2011 19:39:45  The patient returned for an additional axillary view of the right shoulder.  Distal right clavicle appears intact.  AC joint is normally aligned.  Slight irregularity or spurring is seen at the anterior margin of the acromion.  No definite acute bony abnormalities identified.  Discussed with Dr. Colon Branch.  **END ADDENDUM** SIGNED BY: Loraine Leriche A. Tyron Russell, M.D.   08/11/2011  *RADIOLOGY REPORT*  Clinical Data: Right shoulder pain for 2 months  RIGHT SHOULDER - 2+ VIEW  Comparison: None  Findings: AC joint alignment normal. Osseous mineralization grossly  normal for technique. No acute fracture, dislocation, or bone destruction. Visualized right ribs unremarkable. Few scattered clothing artifacts.  IMPRESSION: No acute abnormalities.  Original Report Authenticated By: Lollie Marrow, M.D.     1. Shoulder pain    1845 Discussed xrays with Dr. Tyron Russell, radiologist. Appearance of irregularity at distal end of clavicle. He arranged for additional view. 10 Spoke with Dr. Tyron Russell. No abnormality found on additional xray.    MDM  Patient with right shoulder pain x 2 months with no know injury. PE consistent with bursitis vs rotator cuff injury. Xray with baseline irregularity of distal end of clavicle which is where focal pain is located, however no evidence of fracture. Given analgesic and antiinflammatory with some relief of pain. Referral to orthopedist for follow up.Pt stable in ED with no significant deterioration in condition.The patient appears reasonably screened and/or stabilized for discharge and I doubt any other medical condition or other [facility] requiring further screening, evaluation, or treatment in the ED at this time prior to discharge.  MDM Reviewed: nursing note and vitals Interpretation: x-ray (xrays interpreted by my self and radiologist) Consults: radiology.           Nicoletta Dress. Colon Branch, MD 08/12/11 1121

## 2011-09-07 ENCOUNTER — Ambulatory Visit: Payer: Medicare Other | Admitting: Orthopedic Surgery

## 2011-09-26 ENCOUNTER — Encounter: Payer: Self-pay | Admitting: Orthopedic Surgery

## 2011-09-26 ENCOUNTER — Ambulatory Visit (INDEPENDENT_AMBULATORY_CARE_PROVIDER_SITE_OTHER): Payer: Medicare Other | Admitting: Orthopedic Surgery

## 2011-09-26 VITALS — BP 100/70 | Ht 62.0 in | Wt 158.0 lb

## 2011-09-26 DIAGNOSIS — M67919 Unspecified disorder of synovium and tendon, unspecified shoulder: Secondary | ICD-10-CM

## 2011-09-26 DIAGNOSIS — M7551 Bursitis of right shoulder: Secondary | ICD-10-CM

## 2011-09-26 NOTE — Patient Instructions (Signed)
You have received a steroid shot. 15% of patients experience increased pain at the injection site with in the next 24 hours. This is best treated with ice and tylenol extra strength 2 tabs every 8 hours. If you are still having pain please call the office.   START OT AT THE HOSPITAL   WEAR SLING UNTIL THERAPY STARTED

## 2011-09-26 NOTE — Progress Notes (Signed)
Patient ID: Kirsten Walls, female   DOB: 1966/06/23, 47 y.o.   MRN: [id number] Chief Complaint  Patient presents with  . Shoulder Pain    Right shoulder pain for 3 months, no injury.    History the patient woke up one morning about 3 months ago with her shoulder hurting his progress to sharp throbbing stabbing burning pain with loss of motion and some tingling in the upper extremity.  Pain is relieved with hydrocodone 5 mg and ibuprofen 800 mg she went to the ER x-rays were negative she's had no therapy and has been in a sling for the last 6 weeks  No trauma  Pain came on suddenly  She can't sleep on her RIGHT side  A comprehensive review of systems was performed she notes cold and hot flashes heartburn, joint pain and stiffness, weight gain occasional chills.  All other systems are negative.  History as noted above  Exam the vital signs are stable as recorded.  Gen. Appearance was normal.  Orientation x3 normal.  Would not fit normal.  Gait and station normal.  Pulse and temperature normal.  (RIGHT upper extremity) cervical axillary lymph nodes negative.  Sensation RIGHT arm normal.  Reflexes normal.  Balance coordination normal.  There is global tenderness surrounding the RIGHT shoulder anterior and posterior joint line medial and lateral deltoid as well as anterior deltoid these areas are tender to palpation.  Passive range of motion external rotation equals 45 abduction 90 forward elevation in the scapular plane 120 with pain stability normal strength assessable skin intact  X-ray from the hospital normal  Impression bursitis plan subacromial injection Physical therapy Continue ibuprofen Follow up as needed  Shoulder Injection Procedure Note   Pre-operative Diagnosis: right  RC Syndrome  Post-operative Diagnosis: same  Indications: pain   Anesthesia: ethyl chloride   Procedure Details   Verbal consent was obtained for the procedure. The shoulder was prepped withalcohol  and the skin was anesthetized. A 20 gauge needle was advanced into the subacromial space through posterior approach without difficulty  The space was then injected with 3 ml 1% lidocaine and 1 ml of depomedrol. The injection site was cleansed with isopropyl alcohol and a dressing was applied.  Complications:  None; patient tolerated the procedure well.

## 2011-09-29 ENCOUNTER — Ambulatory Visit (HOSPITAL_COMMUNITY): Payer: Medicare Other | Admitting: Occupational Therapy

## 2011-10-03 ENCOUNTER — Ambulatory Visit (HOSPITAL_COMMUNITY)
Admission: RE | Admit: 2011-10-03 | Discharge: 2011-10-03 | Disposition: A | Discharge status: Home / Self Care | Payer: Medicare Other | Source: Ambulatory Visit | Attending: Orthopedic Surgery | Admitting: Orthopedic Surgery

## 2011-10-03 DIAGNOSIS — IMO0001 Reserved for inherently not codable concepts without codable children: Secondary | ICD-10-CM | POA: Insufficient documentation

## 2011-10-03 DIAGNOSIS — M25619 Stiffness of unspecified shoulder, not elsewhere classified: Secondary | ICD-10-CM | POA: Insufficient documentation

## 2011-10-03 DIAGNOSIS — M25519 Pain in unspecified shoulder: Secondary | ICD-10-CM | POA: Insufficient documentation

## 2011-10-03 DIAGNOSIS — M6281 Muscle weakness (generalized): Secondary | ICD-10-CM | POA: Insufficient documentation

## 2011-10-03 NOTE — Progress Notes (Signed)
Occupational Therapy Evaluation  Patient Details  Name: Kirsten Walls MRN: [id number] Date of Birth: 07-11-1966  Today's Date: 10/03/2011 Time: 6213-0865 OT Evaluation 9:15-945 30' Manual Therapy [phone] 15' Therapeutic Exercise 1000-1010 10'Time Calculation (min): 55 min  Visit#: 1  of 12   Re-eval: 11/02/11  Assessment Diagnosis:  (RIGHT SHOULDER BURSITIS) Next MD Visit:  (Unknown) Prior Therapy:  (None)  Past Medical History: No past medical history on file. Past Surgical History:  Past Surgical History  Procedure Date  . Cesarean section x2  . Bunionectomy both big toe  . Abdominal hysterectomy   . Tumor removal left knee cap  . Knee surgery   . Foot surgery     Subjective Symptoms/Limitations Symptoms: S: "It hurt alot" "I can't lift it over my head it is stiff and I've been in a sling since Novemeber" Limitations: Kirsten Walls is a 49 yr. old female who began having symptoms in October (3 months ago). Originally seen in the ER 08/11/11 and given x-ray and diagnosed with bursitis with discharge instructions for rest and ice. and Rue positioned in a sling. She recently went back to see Dr. Romeo Apple AND WAS GIVEN A CORTIZONE INJECTION AND TOLD TO KEEP THE SLING ON UNTIL SHE GOT TO THEARAPY. Pain Assessment Currently in Pain?: Yes Pain Score:   9 Pain Location: Shoulder Pain Orientation: Right Pain Type: Chronic pain Pain Radiating Towards: uLNAR SIDED NUMBNESS IN RING AND PINKY FINGER AT TIMES. Pain Onset: More than a month ago Pain Frequency: Constant Multiple Pain Sites: No  Precautions/Restrictions     Prior Functioning  Home Living Lives With: Family Receives Help From: Family Type of Home: Apartment Home Layout: Two level Alternate Level Stairs-Rails: Left (asending) Alternate Level Stairs-Number of Steps:  (19) Home Access: Stairs to enter Entrance Stairs-Rails: Left Entrance Stairs-Number of Steps:  (19) Bathroom Shower/Tub: Tub/shower unit Prior  Function Level of Independence: Independent with basic ADLs;Independent with homemaking with ambulation Able to Take Stairs?: Yes Driving: Yes Vocation: On disability Leisure: Hobbies-yes (Comment) Comments:  (likes to cook)  Assessment ADL/Vision/Perception ADL Eating/Feeding: Independent Grooming: Minimal assistance (difficulty doing her own hair.) Upper Body Bathing: Independent Lower Body Bathing: Independent Upper Body Dressing: Independent Lower Body Dressing: Independent Toilet Transfer: Independent Toileting - Clothing Manipulation: Independent Toileting - Hygiene: Independent Tub/Shower Transfer: Independent Vision - History Baseline Vision: Wears glasses all the time Vision - Assessment Eye Alignment: Within Functional Limits Perception Perception: Within Functional Limits Praxis Praxis: Intact  Cognition/Observation Cognition Overall Cognitive Status: Appears within functional limits for tasks assessed Arousal/Alertness: Awake/alert Orientation Level: Oriented X4  Sensation/Coordination/Edema Sensation Light Touch: Impaired Detail (Ulnar digit numbness.)  Additional Assessments RUE Assessment RUE Assessment: Exceptions to [facility] RUE PROM (degrees) Right Shoulder Extension 0-60: 15 Degrees Right Shoulder Flexion  0-170: 60 Degrees Right Shoulder ABduction 0-140: 30 Degrees Right Shoulder Internal Rotation  0-70: 55 Degrees Right Shoulder External Rotation  0-90: 25 Degrees Right Elbow Flexion/Extension 0-135-150:  (130) Right Forearm Pronation  0-80-90:  (WFL) Right Forearm Supination  0-80-90:  (WFL) Right Wrist Extension 0-70:  (WFL) Right Wrist Flexion 0-80:  (WFL) Right Wrist Radial Deviation 0-20:  (WFL) Right Wrist Ulnar Deviation 0-30:  (WFL) Right Composite Finger Extension:  (WNL) Right Composite Finger Flexion:  (WNL) Right Thumb Opposition: Digit 5 RUE Strength Right Shoulder Flexion: 2/5 Right Shoulder Extension: 2/5 Right Shoulder  ABduction: 2/5 Right Shoulder Internal Rotation: 2/5 Right Shoulder External Rotation: 2/5 Right Shoulder Horizontal ABduction: 2/5 Right Shoulder Horizontal ADduction: 2/5 Right Elbow Flexion:  4/5 Right Elbow Extension: 4/5 Right Forearm Pronation: 4/5 Right Forearm Supination: 4/5 Right Wrist Flexion: 4/5 Right Wrist Extension: 4/5 Right Wrist Radial Deviation: 4/5 Right Wrist Ulnar Deviation: 4/5 Gross Grasp: Impaired Grip (lbs):  (10) Lateral Pinch:  (10) 3 Point Pinch:  (4) LUE Assessment LUE Assessment: Within Functional Limits Palpation Palpation:  (Maximal restrictions around the entire rotator cuff, deltoid)  Exercise/Treatments Supine Horizontal ABduction: PROM;5 reps External Rotation: PROM;5 reps Internal Rotation: PROM;5 reps Flexion: PROM;5 reps ABduction: PROM;5 reps Seated Elevation: AROM;10 reps Extension: AROM;10 reps Retraction: AROM;10 reps            Occupational Therapy Assessment and Plan OT Assessment and Plan Clinical Impression Statement: A: Kirsten Walls is beeing seen today for pain and dysfunction in the RUE shoulder that began with shoulder bursitis and was made worse with gaurding and disuse of the RUE. Kirsten Walls has been keeping the RUE shoulder immobilized for the bast 8 weeks and is now in need of occupational therapy to increase A/PROM , strength and functional use and decrease facial restrictions, pain and gaurding. Rehab Potential: Good Clinical Impairments Affecting Rehab Potential: Pateint has frequent gaurding and learned disuse of the RUE with pain and decreased mobility  OT Frequency: Min 3X/week OT Duration: 8 weeks OT Treatment/Interventions: Self-care/ADL training;Neuromuscular education;Therapeutic exercise;Therapeutic activities;Modalities;Manual therapy;Patient/family education   Goals Home Exercise Program Pt will Perform Home Exercise Program: Independently Short Term Goals Time to Complete Short Term Goals: 2  weeks Short Term Goal 1: Pateint will note decreased pain at 5/10 from 8/10  in right shoulder. Short Term Goal 2: Patient will be able to stop wearing the sling through out the day and position RUE at least 30 degrees from the body when at rest. Short Term Goal 3: Patient will decrease facial restrictions from max to mod. Short Term Goal 4: Pateint will RUE shoulder flexion to 90 degrees and Abduction to 80 degrees AAROM Short Term Goal 5: Pateint will be able to be Independent with AAROM to shoulder. Long Term Goals Time to Complete Long Term Goals: 8 weeks Long Term Goal 1: Pateint will regain functional AROM of RUE shoulder for self grooming with ability to do her own hair. Long Term Goal 2: Increased RUE shoulder strength to 4/5. Long Term Goal 3: Increased RUE grip to 40 lbs and pinch to 10 lbs. Long Term Goal 4: Patient will regain pronation / supination / lex/ ext of wrist to 5/5 Long Term Goal 5: Pateint will resume ADL's with pain decreased to 2/10  Problem List Patient Active Problem List  Diagnoses  . NEOPLASMS UNSPEC NATURE BONE SOFT TISSUE&SKIN  . KNEE, ARTHRITIS, DEGEN./OSTEO  . DERANGEMENT MENISCUS  . JOINT EFFUSION, LEFT KNEE  . PATELLO-FEMORAL SYNDROME  . H N P-LUMBAR  . LOW BACK PAIN  . BUNION  . TEAR MEDIAL MENISCUS    End of Session Activity Tolerance: Patient tolerated treatment well General Behavior During Session: WFL for tasks performed Cognition: WFL for tasks performed OT Plan of Care OT Home Exercise Plan: P: Skilled OT is required 2-3 times a week to increase function, strength and mobility and decrease pain, gaurding and disuse. Therapy will begin with gentle AAROM PROM and HEP followed by can exercises, isometric strengthening and strengthening in pain fre range, scapular stbility exercises and MFR, Joint mobilization. Consulted and Agree with Plan of Care: Patient   Lisa Roca OTR/L 10/03/2011, 10:27 AM  Physician Documentation Your  signature is required to indicate approval of the treatment  plan as stated above.  Please sign and either send electronically or make a copy of this report for your files and return this physician signed original.  Please mark one 1.__approve of plan  2. ___approve of plan with the following conditions.   ______________________________                                                          _____________________ Physician Signature                                                                                                             Date

## 2011-10-09 ENCOUNTER — Emergency Department (HOSPITAL_COMMUNITY)
Admission: EM | Admit: 2011-10-09 | Discharge: 2011-10-09 | Disposition: A | Discharge status: Home / Self Care | Payer: Medicare Other | Attending: Emergency Medicine | Admitting: Emergency Medicine

## 2011-10-09 ENCOUNTER — Encounter (HOSPITAL_COMMUNITY): Payer: Self-pay | Admitting: Emergency Medicine

## 2011-10-09 ENCOUNTER — Emergency Department (HOSPITAL_COMMUNITY): Payer: Medicare Other

## 2011-10-09 DIAGNOSIS — F17200 Nicotine dependence, unspecified, uncomplicated: Secondary | ICD-10-CM | POA: Insufficient documentation

## 2011-10-09 DIAGNOSIS — Z9079 Acquired absence of other genital organ(s): Secondary | ICD-10-CM | POA: Insufficient documentation

## 2011-10-09 DIAGNOSIS — Z9889 Other specified postprocedural states: Secondary | ICD-10-CM | POA: Insufficient documentation

## 2011-10-09 DIAGNOSIS — R51 Headache: Secondary | ICD-10-CM

## 2011-10-09 DIAGNOSIS — R040 Epistaxis: Secondary | ICD-10-CM | POA: Insufficient documentation

## 2011-10-09 LAB — CBC
MCV: 91.3 fL (ref 78.0–100.0)
Platelets: 298 10*3/uL (ref 150–400)
RDW: 14.1 % (ref 11.5–15.5)
WBC: 7.4 10*3/uL (ref 4.0–10.5)

## 2011-10-09 LAB — DIFFERENTIAL
Basophils Absolute: 0 10*3/uL (ref 0.0–0.1)
Eosinophils Relative: 3 % (ref 0–5)
Lymphocytes Relative: 41 % (ref 12–46)
Neutro Abs: 3.7 10*3/uL (ref 1.7–7.7)

## 2011-10-09 LAB — COMPREHENSIVE METABOLIC PANEL
Alkaline Phosphatase: 92 U/L (ref 39–117)
BUN: 15 mg/dL (ref 6–23)
CO2: 28 mEq/L (ref 19–32)
Chloride: 104 mEq/L (ref 96–112)
GFR calc Af Amer: >90 mL/min (ref >90)
Glucose, Bld: 88 mg/dL (ref 70–99)
Potassium: 3.4 mEq/L — ABNORMAL LOW (ref 3.5–5.1)
Total Bilirubin: 0.3 mg/dL (ref 0.3–1.2)

## 2011-10-09 MED ORDER — DIPHENHYDRAMINE HCL 50 MG/ML IJ SOLN
25.0000 mg | Freq: Once | INTRAMUSCULAR | Status: AC
  Administered 2011-10-09: 25 mg via INTRAVENOUS
  Filled 2011-10-09: qty 1

## 2011-10-09 MED ORDER — BUTALBITAL-APAP-CAFFEINE 50-325-40 MG PO TABS: 1.0000 | ORAL_TABLET | Freq: Four times a day (QID) | ORAL | Status: AC | PRN

## 2011-10-09 MED ORDER — METOCLOPRAMIDE HCL 5 MG/ML IJ SOLN
20.0000 mg | Freq: Once | INTRAVENOUS | Status: AC
  Administered 2011-10-09: 20 mg via INTRAVENOUS
  Filled 2011-10-09 (×2): qty 4

## 2011-10-09 MED ORDER — DEXAMETHASONE SODIUM PHOSPHATE 4 MG/ML IJ SOLN
8.0000 mg | Freq: Once | INTRAMUSCULAR | Status: AC
  Administered 2011-10-09: 8 mg via INTRAVENOUS
  Filled 2011-10-09: qty 2

## 2011-10-09 MED ORDER — METOCLOPRAMIDE HCL 5 MG/ML IJ SOLN
INTRAMUSCULAR | Status: AC
  Filled 2011-10-09: qty 2

## 2011-10-09 MED ORDER — KETOROLAC TROMETHAMINE 30 MG/ML IJ SOLN
30.0000 mg | Freq: Once | INTRAMUSCULAR | Status: AC
  Administered 2011-10-09: 30 mg via INTRAVENOUS
  Filled 2011-10-09: qty 1

## 2011-10-09 MED ORDER — SODIUM CHLORIDE 0.9 % IV BOLUS (SEPSIS)
1000.0000 mL | Freq: Once | INTRAVENOUS | Status: AC
  Administered 2011-10-09: 1000 mL via INTRAVENOUS

## 2011-10-09 NOTE — ED Notes (Signed)
Patient complaining of a headache off and on x 3 months. Also states she has nosebleeds with the headaches most of the time. Patient states her nose was bleeding when she woke this morning but no bleeding noted at triage.

## 2011-10-09 NOTE — ED Provider Notes (Signed)
This chart was scribed for Gerhard Munch, MD by Williemae Natter. The patient was seen in room [facility] at 7:10 AM.  CSN: [id number]  Arrival date & time 10/09/11  0630   First MD Initiated Contact with Patient 10/09/11 0703      Chief Complaint  Patient presents with  . Headache    (Consider location/radiation/quality/duration/timing/severity/associated sxs/prior treatment) HPI Kirsten Walls is a 46 y.o. female who presents to the Emergency Department complaining of intermittent headaches for the past 3 months. Pt describes the pain as a throbbing and squeezing sensation. Current headache is rated a 10/10. She describes the headaches as gradual (and spontaneous) onset with no known triggers and notes that taking vinegar reduces the pain. Currently she is also treating her Sx with Ibuprofen and Advil. Pt has associated nosebleeds with headaches. Pt denies any confusion, disorientation, nausea, vomiting, diarrhea, chest pain, abdominal pain, or dysuria. She has a history of knee pain for which she is taking hydrocodone and Ibuprofen.  History reviewed. No pertinent past medical history.  Past Surgical History  Procedure Date  . Cesarean section x2  . Bunionectomy both big toe  . Abdominal hysterectomy   . Tumor removal left knee cap  . Knee surgery   . Foot surgery     Family History  Problem Relation Age of Onset  . Asthma      History  Substance Use Topics  . Smoking status: Current Everyday Smoker  . Smokeless tobacco: Not on file  . Alcohol Use: No    OB History    Grav Para Term Preterm Abortions TAB SAB Ect Mult Living                  Review of Systems  HENT: Positive for nosebleeds.   Cardiovascular: Negative for chest pain.  Gastrointestinal: Negative for nausea, vomiting, abdominal pain and diarrhea.  Genitourinary: Negative for dysuria and hematuria.  Neurological: Positive for headaches. Negative for light-headedness.  Psychiatric/Behavioral:  Negative for confusion.  All other systems reviewed and are negative.     Allergies  Penicillins  Home Medications   Current Outpatient Rx  Name Route Sig Dispense Refill  . ALBUTEROL SULFATE HFA 108 (90 BASE) MCG/ACT IN AERS Inhalation Inhale 2 puffs into the lungs every 6 (six) hours as needed. For shortness of breath/asthma/bronchitis     . ALPRAZOLAM 0.5 MG PO TABS Oral Take 0.5 mg by mouth at bedtime as needed. For sleep     . HYDROCODONE-ACETAMINOPHEN 5-325 MG PO TABS Oral Take 1 tablet by mouth every 6 (six) hours as needed.      . IBUPROFEN 800 MG PO TABS Oral Take 800 mg by mouth every 8 (eight) hours as needed.      Marland Kitchen OXYMETAZOLINE HCL 0.05 % NA SOLN Nasal Place 2 sprays into the nose 3 (three) times daily.      Marland Kitchen PANTOPRAZOLE SODIUM 40 MG PO TBEC Oral Take 40 mg by mouth 2 (two) times daily.       Pulse oximetry on room air is 99%. Normal by my interpretation.  BP 133/84  Pulse 62  Temp 98.3 F (36.8 C)  Resp 14  Ht 5\' 2"  (1.575 m)  Wt 120 lb (54.432 kg)  BMI 21.95 kg/m2  SpO2 99%  Physical Exam  Nursing note and vitals reviewed. Constitutional: She is oriented to person, place, and time. She appears well-developed and well-nourished.  HENT:  Head: Normocephalic and atraumatic.  Eyes: Conjunctivae are normal. Pupils are equal, round,  and reactive to light.  Neck: Normal range of motion. Neck supple.  Cardiovascular: Normal rate, regular rhythm and normal heart sounds.   Pulmonary/Chest: Effort normal and breath sounds normal.  Musculoskeletal: Normal range of motion.  Neurological: She is alert and oriented to person, place, and time. She has normal strength. No cranial nerve deficit or sensory deficit. She displays a negative Romberg sign. Coordination normal.  Skin: Skin is warm and dry.  Psychiatric: She has a normal mood and affect. Her behavior is normal.    ED Course  Procedures (including critical care time)  Labs Reviewed - No data to display No  results found.   No diagnosis found.  CT reviewed by me - no acute findings  MDM  I personally performed the services described in this documentation, which was scribed in my presence. The recorded information has been reviewed and considered.  This 46 year old female presents with new headaches.  Notably, the patient had no headaches, significantly prior to 3 months ago.  Today.  She presents with new "typical" head pain.  On exam the patient is in no distress, with no neurologic deficits, stable vital signs.  The patient had CAT scan.  Due to the absence of prior evaluation for new headaches, which are unusual in a 46 year old female the reassuring.  Absence of findings in the CAT scan, the patient's description of no neurologic deficits during her episodes, the absence of findings on exam, oral, reassuring.  The patient's headaches may be attributed to a new migraine, or chronic rebound phenomena, or benign, headache, the aforementioned reassuring findings are all notable.  The patient will be discharged to follow up with neurology for further evaluation and management of her headaches       Gerhard Munch, MD 10/09/11 [phone]

## 2011-10-10 ENCOUNTER — Ambulatory Visit (HOSPITAL_COMMUNITY)
Admission: RE | Admit: 2011-10-10 | Discharge: 2011-10-10 | Disposition: A | Discharge status: Home / Self Care | Payer: Medicare Other | Source: Ambulatory Visit | Attending: Orthopedic Surgery | Admitting: Orthopedic Surgery

## 2011-10-10 NOTE — Progress Notes (Signed)
Occupational Therapy Treatment  Patient Details  Name: Kirsten Walls MRN: [id number] Date of Birth: 03-May-1966  Today's Date: 10/10/2011 Time: 1022-1117 Time Calculation (min): 55 min  Visit#: 2  of 12   Re-eval: 10/23/11 Manual Therapy 1022-1046 22' Therapeutic Exercise [id number] 30'    Subjective Symptoms/Limitations Symptoms: S:  It is hurting a little bit. Pain Assessment Currently in Pain?: Yes Pain Score:   8 Pain Location: Shoulder Pain Orientation: Right Pain Type: Acute pain  Precautions/Restrictions     Exercise/Treatments Supine Protraction: PROM;AROM;10 reps Horizontal ABduction: PROM;AROM;10 reps External Rotation: PROM;AROM;10 reps Internal Rotation: PROM;AROM;10 reps Flexion: PROM;AROM;10 reps ABduction: PROM;AROM;10 reps Seated Extension: Theraband;10 reps Theraband Level (Shoulder Extension): Level 2 (Red) Retraction: Theraband;10 reps Theraband Level (Shoulder Retraction): Level 2 (Red) Row: Theraband;10 reps Theraband Level (Shoulder Row): Level 2 (Red) External Rotation: Theraband;10 reps Theraband Level (Shoulder External Rotation): Level 2 (Red) Internal Rotation: Theraband;10 reps Theraband Level (Shoulder Internal Rotation): Level 2 (Red) Therapy Ball Flexion: 20 reps ABduction: 20 reps Right/Left: 5 reps ROM / Strengthening / Isometric Strengthening UBE (Upper Arm Bike): 3' forward 3' backward 1.0 Wall Wash: 3' Thumb Tacks: 1' Prot/Ret//Elev/Dep: 1'     Manual Therapy Manual Therapy: Myofascial release Myofascial Release: MFR and manual stretching to right scapula, upper trap and upper arm to decrease restrictions and increase A/PROM  Occupational Therapy Assessment and Plan OT Assessment and Plan Clinical Impression Statement: A:  Patient arrived wearing her sling and when not wearing sling kept arm grarded with elbow flexed into her side.  Instructed patient to stop wearing sling and keep arm down at side.  Patient completed  multiple new exercises. Rehab Potential: Good OT Plan: P: Add Seated AROM.   Goals Home Exercise Program Pt will Perform Home Exercise Program: Independently Short Term Goals Time to Complete Short Term Goals: 2 weeks Short Term Goal 1: Pateint will note decreased pain at 5/10 from 8/10  in right shoulder. Short Term Goal 2: Patient will be able to stop wearing the sling through out the day and position RUE at least 30 degrees from the body when at rest. Short Term Goal 3: Patient will decrease facial restrictions from max to mod. Short Term Goal 4: Pateint will RUE shoulder flexion to 90 degrees and Abduction to 80 degrees AAROM Short Term Goal 5: Pateint will be able to be Independent with AAROM to shoulder. Long Term Goals Time to Complete Long Term Goals: 8 weeks Long Term Goal 1: Pateint will regain functional AROM of RUE shoulder for self grooming with ability to do her own hair. Long Term Goal 2: Increased RUE shoulder strength to 4/5. Long Term Goal 3: Increased RUE grip to 40 lbs and pinch to 10 lbs. Long Term Goal 4: Patient will regain pronation / supination / lex/ ext of wrist to 5/5 Long Term Goal 5: Pateint will resume ADL's with pain decreased to 2/10  Problem List Patient Active Problem List  Diagnoses  . NEOPLASMS UNSPEC NATURE BONE SOFT TISSUE&SKIN  . KNEE, ARTHRITIS, DEGEN./OSTEO  . DERANGEMENT MENISCUS  . JOINT EFFUSION, LEFT KNEE  . PATELLO-FEMORAL SYNDROME  . H N P-LUMBAR  . LOW BACK PAIN  . BUNION  . TEAR MEDIAL MENISCUS    End of Session Activity Tolerance: Patient tolerated treatment well General Behavior During Session: [facility] for tasks performed Cognition: [facility] for tasks performed   Rhegan Trunnell L. Kamaya Keckler, COTA/L  10/10/2011, 11:47 AM

## 2011-10-13 ENCOUNTER — Ambulatory Visit (HOSPITAL_COMMUNITY)
Admission: RE | Admit: 2011-10-13 | Discharge: 2011-10-13 | Disposition: A | Discharge status: Home / Self Care | Payer: Medicare Other | Source: Ambulatory Visit | Attending: Occupational Therapy | Admitting: Occupational Therapy

## 2011-10-13 NOTE — Progress Notes (Signed)
Occupational Therapy Treatment  Patient Details  Name: Kirsten Walls MRN: [id number] Date of Birth: 1966-05-11  Today's Date: 10/13/2011 Time: 1000-1045 Manual Therapy 1000-1010 10' Therapeutic Exercises 1010-1045 35' Time Calculation (min): 45 min  Visit#: 3  of 12   Re-eval: 11/02/11    Subjective Symptoms/Limitations Symptoms: A: "I can lift it up now and have started using it. It doesn't hurt too bad today about a 5" Repetition: Decreases Symptoms Pain Assessment Currently in Pain?: Yes Pain Score:   5 Pain Location: Shoulder Pain Orientation: Right Pain Type: Acute pain Pain Radiating Towards: Pinky numbness gone. Pain Onset: More than a month ago Pain Frequency: Intermittent  Precautions/Restrictions     Exercise/Treatments Supine Horizontal ABduction: PROM;AAROM;5 reps External Rotation: PROM;AROM;5 reps Internal Rotation: PROM;AAROM;5 reps Flexion: PROM;AAROM;5 reps ABduction: PROM;AAROM;5 reps Seated Elevation: Strengthening;10 reps;Theraband Theraband Level (Shoulder Elevation): Level 2 (Red) Extension: Theraband;10 reps Theraband Level (Shoulder Extension): Level 2 (Red) Retraction: Theraband;10 reps Theraband Level (Shoulder Retraction): Level 2 (Red) Row: Theraband;10 reps Theraband Level (Shoulder Row): Level 2 (Red) Pulleys Flexion: 1 minute ABduction: 1 minute Therapy Ball Flexion: 25 reps ABduction: 25 reps Right/Left: 5 reps ROM / Strengthening / Isometric Strengthening UBE (Upper Arm Bike): 3' forward 3' backward 1.0 Flexion: 3X3" Internal Rotation: 3X3" ABduction: 3X3" ADduction: 3X3" Stretches: Trained in wall stretches  Wrist Exercises Forearm Supination: Strengthening;15 reps;Seated;Bar weights/barbell Bar Weights/Barbell (Forearm Supination): 1 lb Forearm Pronation: Strengthening;15 reps;Bar weights/barbell Bar Weights/Barbell (Forearm Pronation): 1 lb Wrist Flexion: Strengthening;Seated;Bar weights/barbell Bar  Weights/Barbell (Wrist Flexion): 1 lb Wrist Extension: Strengthening;20 reps;Bar weights/barbell Bar Weights/Barbell (Wrist Extension): 1 lb Wrist Radial Deviation: Strengthening;Seated;Bar weights/barbell Wrist Ulnar Deviation: Strengthening;15 reps;Seated;Bar weights/barbell     Hand Exercises: gripper one red band 30 reps.         Occupational Therapy Assessment and Plan OT Assessment and Plan Clinical Impression Statement: A: Patient arrived for the first time to therapy without her sling on. States she can lift her arm now and is reaching it up at home. No longer gaurding her arm. Rehab Potential: Good Clinical Impairments Affecting Rehab Potential: Pateint has frequent gaurding and learned disuse of the RUE with pain and decreased mobility  OT Frequency: Min 3X/week OT Duration: 8 weeks OT Treatment/Interventions: Self-care/ADL training;Neuromuscular education;Therapeutic exercise;Therapeutic activities;Modalities;Manual therapy;Patient/family education OT Plan: P: Added icometrics and wrist strengthening exercises. Begin cane exercises from seated position and tband exercises.   Goals Home Exercise Program Pt will Perform Home Exercise Program: Independently Short Term Goals Short Term Goal 1 Progress: Met Short Term Goal 2 Progress: Met Short Term Goal 3 Progress: Met Short Term Goal 4 Progress: Met Short Term Goal 5 Progress: Met Long Term Goals Long Term Goal 1 Progress: Progressing toward goal Long Term Goal 2 Progress: Progressing toward goal Long Term Goal 3 Progress: Progressing toward goal Long Term Goal 4 Progress: Progressing toward goal Long Term Goal 5 Progress: Progressing toward goal  Problem List Patient Active Problem List  Diagnoses  . NEOPLASMS UNSPEC NATURE BONE SOFT TISSUE&SKIN  . KNEE, ARTHRITIS, DEGEN./OSTEO  . DERANGEMENT MENISCUS  . JOINT EFFUSION, LEFT KNEE  . PATELLO-FEMORAL SYNDROME  . H N P-LUMBAR  . LOW BACK PAIN  . BUNION  . TEAR  MEDIAL MENISCUS    End of Session Activity Tolerance: Patient tolerated treatment well General Behavior During Session: [facility] for tasks performed Cognition: [facility] for tasks performed OT Plan of Care OT Home Exercise Plan: P: Skilled OT is required 2-3 times a week to increase function, strength and mobility and  decrease pain, gaurding and disuse. Therapy will begin with gentle AAROM PROM and HEP followed by can exercises, isometric strengthening and strengthening in pain fre range, scapular stbility exercises and MFR, Joint mobilization. Consulted and Agree with Plan of Care: Patient   Lisa Roca OTR/L 10/13/2011, 10:49 AM

## 2011-10-17 ENCOUNTER — Ambulatory Visit (HOSPITAL_COMMUNITY)
Admission: RE | Admit: 2011-10-17 | Discharge: 2011-10-17 | Disposition: A | Discharge status: Home / Self Care | Payer: Medicare Other | Source: Ambulatory Visit | Attending: Orthopedic Surgery | Admitting: Orthopedic Surgery

## 2011-10-17 NOTE — Progress Notes (Signed)
Occupational Therapy Treatment  Patient Details  Name: Kirsten Walls MRN: [id number] Date of Birth: June 11, 1966  Today's Date: 10/17/2011 Time: 0927-1015 Therapeutic Exercise 38 min Manual Therapy 10' Time Calculation (min): 48 min  Visit#: 4  of 12   Re-eval: 11/02/11    Subjective Symptoms/Limitations Symptoms: A: "I can lift it up now and have started using it. It doesn't hurt too bad today about a 5" Repetition: Decreases Symptoms Pain Assessment Currently in Pain?: No/denies Pain Score: 0-No pain Pain Location: Shoulder Pain Orientation: Right Pain Onset: More than a month ago Multiple Pain Sites: No  Precautions/Restrictions     Exercise/Treatments Supine Protraction: PROM;AROM;10 reps Horizontal ABduction: PROM;AAROM;5 reps External Rotation: PROM;AROM;5 reps Internal Rotation: PROM;AAROM;5 reps Flexion: PROM;AAROM;5 reps ABduction: PROM;AAROM;5 reps Seated Extension: Theraband;10 reps Theraband Level (Shoulder Extension): Level 2 (Red) Retraction: Theraband;10 reps Theraband Level (Shoulder Retraction): Level 2 (Red) Row: Theraband;10 reps Theraband Level (Shoulder Row): Level 2 (Red) Internal Rotation: Theraband;10 reps Theraband Level (Shoulder Internal Rotation): Level 2 (Red) Pulleys Flexion: 1 minute ABduction: 1 minute Therapy Ball Flexion: 25 reps ABduction: 25 reps Right/Left: 5 reps ROM / Strengthening / Isometric Strengthening UBE (Upper Arm Bike): 3' forward 3' backward 1.0 Cybex Press: 1 plate Cybex Row: 1 plate Wall Wash: 3' Thumb Tacks: 1'  Occupational Therapy Assessment and Plan OT Assessment and Plan Clinical Impression Statement: A: Patient has increased use of RUE and decreased gaurding. Still unable to lift arm without pain and very weak but advanceing. Rehab Potential: Good OT Frequency: Min 3X/week OT Duration: 8 weeks OT Treatment/Interventions: Therapeutic exercise;Self-care/ADL training;Manual  therapy;Modalities;Therapeutic activities;Patient/family education OT Plan: P: Began cane exercises from supine position. Add Isometrics at next visit.   Goals Short Term Goals Short Term Goal 1 Progress: Met Short Term Goal 2 Progress: Met Short Term Goal 3 Progress: Met Short Term Goal 4 Progress: Met Short Term Goal 5 Progress: Met Long Term Goals Time to Complete Long Term Goals: 8 weeks Long Term Goal 1 Progress: Progressing toward goal Long Term Goal 2 Progress: Progressing toward goal Long Term Goal 3 Progress: Progressing toward goal Long Term Goal 4 Progress: Progressing toward goal Long Term Goal 5 Progress: Met  Problem List Patient Active Problem List  Diagnoses  . NEOPLASMS UNSPEC NATURE BONE SOFT TISSUE&SKIN  . KNEE, ARTHRITIS, DEGEN./OSTEO  . DERANGEMENT MENISCUS  . JOINT EFFUSION, LEFT KNEE  . PATELLO-FEMORAL SYNDROME  . H N P-LUMBAR  . LOW BACK PAIN  . BUNION  . TEAR MEDIAL MENISCUS    End of Session Equipment Utilized During Treatment: Cane, pulley, UBE Activity Tolerance: Patient tolerated treatment well General Behavior During Session: [facility] for tasks performed Cognition: [facility] for tasks performed   Lisa Roca OTR/L 10/17/2011, 12:09 PM

## 2011-10-19 ENCOUNTER — Ambulatory Visit (HOSPITAL_COMMUNITY): Payer: Medicare Other | Admitting: Occupational Therapy

## 2011-10-23 ENCOUNTER — Inpatient Hospital Stay (HOSPITAL_COMMUNITY)
Admission: RE | Admit: 2011-10-23 | Discharge: 2011-10-23 | Payer: Medicare Other | Source: Ambulatory Visit | Attending: Occupational Therapy | Admitting: Occupational Therapy

## 2011-10-23 ENCOUNTER — Telehealth (HOSPITAL_COMMUNITY): Payer: Self-pay

## 2011-10-24 ENCOUNTER — Ambulatory Visit (HOSPITAL_COMMUNITY): Payer: Medicare Other | Admitting: Occupational Therapy

## 2011-10-25 ENCOUNTER — Ambulatory Visit (HOSPITAL_COMMUNITY)
Admission: RE | Admit: 2011-10-25 | Discharge: 2011-10-25 | Disposition: A | Discharge status: Home / Self Care | Payer: Medicare Other | Source: Ambulatory Visit | Attending: Orthopedic Surgery | Admitting: Orthopedic Surgery

## 2011-10-25 DIAGNOSIS — M6281 Muscle weakness (generalized): Secondary | ICD-10-CM | POA: Insufficient documentation

## 2011-10-25 DIAGNOSIS — IMO0001 Reserved for inherently not codable concepts without codable children: Secondary | ICD-10-CM | POA: Insufficient documentation

## 2011-10-25 DIAGNOSIS — M25519 Pain in unspecified shoulder: Secondary | ICD-10-CM | POA: Insufficient documentation

## 2011-10-25 DIAGNOSIS — M25619 Stiffness of unspecified shoulder, not elsewhere classified: Secondary | ICD-10-CM | POA: Insufficient documentation

## 2011-10-25 NOTE — Progress Notes (Signed)
Occupational Therapy Treatment  Patient Details  Name: Dimonique Bourdeau MRN: [id number] Date of Birth: 05-04-1966  Today's Date: 10/25/2011 Time: 0930-1015 Moist heat to shoulder.10 min Manual Therapy 940-950 10' Therapeutic Exercise [phone] 25' Time Calculation (min): 45 min  Visit#: 5  of 12   Re-eval: 11/02/11    Subjective Symptoms/Limitations Symptoms: A: Patient states her shoulder is stiff and painful as she did some packing of items around the house yesterday. Repetition: Increases Symptoms Pain Assessment Currently in Pain?: Yes Pain Score:   6 Pain Location: Shoulder Pain Orientation: Right Pain Type: Acute pain Pain Radiating Towards: Stiff having some numbness in her pinky finger. Pain Onset: More than a month ago Pain Frequency: Intermittent Pain Relieving Factors: rest Multiple Pain Sites: No  Precautions/Restrictions     Exercise/Treatments Supine Protraction: PROM;AAROM;10 reps Horizontal ABduction: PROM;AAROM;10 reps Flexion: PROM;AAROM;10 reps ABduction: PROM;AAROM;10 reps Seated Elevation: Strengthening;10 reps;Weights Theraband Level (Shoulder Elevation):  (2 lb) Extension: Theraband;10 reps Theraband Level (Shoulder Extension): Level 2 (Red) Retraction: Theraband;10 reps Theraband Level (Shoulder Retraction): Level 2 (Red) Row: Theraband;10 reps Theraband Level (Shoulder Row): Level 2 (Red) External Rotation: Strengthening;10 reps;Weights Theraband Level (Shoulder External Rotation):  (2lb) External Rotation Weight (lbs): 2 Internal Rotation: 10 reps;Strengthening;Weights Internal Rotation Weight (lbs): 2 Pulleys Flexion: 1 minute ABduction: 1 minute Therapy Ball Flexion: 20 reps ABduction: 20 reps Right/Left: 5 reps ROM / Strengthening / Isometric Strengthening UBE (Upper Arm Bike): 3' forward 3' backward 1.0 Wall Wash: 2    Manual Therapy Manual Therapy: Joint mobilization Joint Mobilization: joint mobilization shoulder     Occupational Therapy Assessment and Plan OT Assessment and Plan Clinical Impression Statement: A: Patient began with 6/1- pain and ended with 4/10 aafter treatment. Increased pain with movemnt past 90 degrees flexion and abd. of shoulder. Rehab Potential: Good OT Frequency: Min 3X/week OT Duration: 8 weeks OT Treatment/Interventions: Self-care/ADL training;Therapeutic exercise;Modalities;Manual therapy;Patient/family education OT Plan: P: do isometrics to try to gradually ncrease strength without immpingement.      Problem List Patient Active Problem List  Diagnoses  . NEOPLASMS UNSPEC NATURE BONE SOFT TISSUE&SKIN  . KNEE, ARTHRITIS, DEGEN./OSTEO  . DERANGEMENT MENISCUS  . JOINT EFFUSION, LEFT KNEE  . PATELLO-FEMORAL SYNDROME  . H N P-LUMBAR  . LOW BACK PAIN  . BUNION  . TEAR MEDIAL MENISCUS    End of Session Activity Tolerance: Patient tolerated treatment well General Behavior During Session: [facility] for tasks performed Cognition: [facility] for tasks performed   Lisa Roca OTR/L 10/25/2011, 10:21 AM

## 2011-10-26 ENCOUNTER — Ambulatory Visit (HOSPITAL_COMMUNITY)
Admission: RE | Admit: 2011-10-26 | Discharge: 2011-10-26 | Disposition: A | Discharge status: Home / Self Care | Payer: Medicare Other | Source: Ambulatory Visit | Attending: Family Medicine | Admitting: Family Medicine

## 2011-10-26 NOTE — Progress Notes (Signed)
Occupational Therapy Treatment  Patient Details  Name: Kirsten Walls MRN: [id number] Date of Birth: 1966-05-27  Today's Date: 10/26/2011 Time: 1015-1105 Moist heat to shoulder 10 min Therapeutic Exercise 1025-1105  40' Time Calculation (min): 50 min  Visit#: 6  of 12   Re-eval: 11/02/11    Subjective Symptoms/Limitations Symptoms: A: I couldn't sleep hardly at all due to pain. It still hurts." Pain Assessment Currently in Pain?: Yes Pain Score:   6 Pain Location: Shoulder Pain Orientation: Right Pain Type: Acute pain Pain Frequency: Intermittent Pain Relieving Factors: Rest  Precautions/Restrictions     Exercise/Treatments Supine Protraction: PROM;AAROM;10 reps Horizontal ABduction: PROM;AAROM;10 reps External Rotation: AROM;10 reps Internal Rotation: AROM;10 reps Flexion: PROM;AAROM;10 reps ABduction: PROM;AAROM;10 reps Seated Elevation: Strengthening;10 reps;Weights Elevation Weight (lbs):  (2lbs) Extension: Theraband;10 reps Theraband Level (Shoulder Extension): Level 3 (Green) Retraction: Theraband;10 reps Theraband Level (Shoulder Retraction): Level 3 (Green) Row: Theraband;10 reps Theraband Level (Shoulder Row): Level 3 (Green) Horizontal ABduction: 10 reps;Strengthening;Weights Horizontal ABduction Weight (lbs):  (1 lb) External Rotation: Strengthening;10 reps;Weights External Rotation Weight (lbs): 2 Internal Rotation: 10 reps;Strengthening;Weights Internal Rotation Weight (lbs): 2 Flexion: Strengthening;10 reps;Weights Flexion Weight (lbs):  (1) Abduction: Strengthening;10 reps;Weights ABduction Weight (lbs):  (1) Prone    Sidelying   Standing   Pulleys   Therapy Ball Right/Left: 5 reps ROM / Strengthening / Isometric Strengthening UBE (Upper Arm Bike): 3' forward 3' backward 1.0 Wall Wash: 2   Stretches   Power Physiological scientist              Modalities Modalities: Moist Heat (right shoulder 10 min) Manual  Therapy Manual Therapy: Joint mobilization Joint Mobilization: Right shoulder gentle Moist Heat Therapy Number Minutes Moist Heat: 10 Minutes Moist Heat Location: Shoulder  Occupational Therapy Assessment and Plan OT Assessment and Plan Clinical Impression Statement: A: Patient began with 5/10 pain and ended with 3/10 pain. Strengthening below 90 degrees. Rehab Potential: Good OT Frequency: Min 3X/week OT Duration: 8 weeks OT Treatment/Interventions: Self-care/ADL training;Therapeutic exercise;Manual therapy;Modalities;Therapeutic activities;Patient/family education OT Plan: P: Add isometrics for HEP.   Goals Long Term Goals Time to Complete Long Term Goals: 8 weeks  Problem List Patient Active Problem List  Diagnoses  . NEOPLASMS UNSPEC NATURE BONE SOFT TISSUE&SKIN  . KNEE, ARTHRITIS, DEGEN./OSTEO  . DERANGEMENT MENISCUS  . JOINT EFFUSION, LEFT KNEE  . PATELLO-FEMORAL SYNDROME  . H N P-LUMBAR  . LOW BACK PAIN  . BUNION  . TEAR MEDIAL MENISCUS    End of Session Activity Tolerance: Patient tolerated treatment well General Behavior During Session: [facility] for tasks performed Cognition: [facility] for tasks performed   Lisa Roca OTR/L 10/26/2011, 11:04 AM

## 2011-10-30 ENCOUNTER — Ambulatory Visit (HOSPITAL_COMMUNITY): Payer: Medicare Other | Admitting: Occupational Therapy

## 2011-10-31 ENCOUNTER — Inpatient Hospital Stay (HOSPITAL_COMMUNITY): Admission: RE | Admit: 2011-10-31 | Payer: Medicare Other | Source: Ambulatory Visit | Admitting: Occupational Therapy

## 2011-11-03 ENCOUNTER — Ambulatory Visit (HOSPITAL_COMMUNITY): Payer: Medicare Other | Admitting: Occupational Therapy

## 2011-11-06 ENCOUNTER — Ambulatory Visit (HOSPITAL_COMMUNITY): Payer: Medicare Other | Admitting: Occupational Therapy

## 2011-11-08 ENCOUNTER — Ambulatory Visit (HOSPITAL_COMMUNITY)
Admission: RE | Admit: 2011-11-08 | Discharge: 2011-11-08 | Disposition: A | Discharge status: Home / Self Care | Payer: Medicare Other | Source: Ambulatory Visit | Attending: Family Medicine | Admitting: Family Medicine

## 2011-11-08 NOTE — Evaluation (Signed)
Occupational Therapy Re-Evaluation / Discharge  Patient Details  Name: Kirsten Walls MRN: [id number] Date of Birth: 06-09-1966  Today's Date: 11/08/2011 Time: 0930-1000 Re-evaluation 930-9:4515 min Therapeutic Exercise [phone] 15' Time Calculation (min): 30 min  Visit#: 7  of 12   Re-eval: 11/08/11  Assessment Diagnosis:  (RIGHT SHOULDER BURSITIS) Next MD Visit:  (Unknown) Prior Therapy:  (None)  Past Medical History: No past medical history on file. Past Surgical History:  Past Surgical History  Procedure Date  . Cesarean section x2  . Bunionectomy both big toe  . Abdominal hysterectomy   . Tumor removal left knee cap  . Knee surgery   . Foot surgery     Subjective Symptoms/Limitations Symptoms: A: "I have been doing my exercises at home. Check it out (lifts arms up in the air.) I don't have any pain" Repetition: Decreases Symptoms Pain Assessment Currently in Pain?: No/denies Pain Score: 0-No pain  Precautions/Restrictions None Precautions Precautions:  (None)  Prior Functioning Fully functionaling Home Living Lives With: Family Receives Help From: Family Type of Home: Apartment Home Layout: Two level Alternate Level Stairs-Rails: Left (asending) Alternate Level Stairs-Number of Steps:  (19) Home Access: Stairs to enter Entrance Stairs-Rails: Left Entrance Stairs-Number of Steps:  (19) Bathroom Shower/Tub: Tub/shower unit Prior Function Level of Independence: Independent with basic ADLs;Independent with homemaking with ambulation Able to Take Stairs?: Yes Driving: Yes Vocation: On disability Leisure: Hobbies-yes (Comment)  Assessment ADL/Vision/Perception ADL Eating/Feeding: Independent Grooming: Minimal assistance (difficulty doing her own hair.) Upper Body Bathing: Independent Lower Body Bathing: Independent Lower Body Dressing: Independent Toilet Transfer: Independent Toileting - Clothing Manipulation: Independent Toileting - Hygiene:  Independent Tub/Shower Transfer: Independent Vision - History Baseline Vision: Wears glasses all the time Vision - Assessment Eye Alignment: Within Functional Limits Perception Perception: Within Functional Limits Praxis Praxis: Intact  Cognition/Observation Cognition Overall Cognitive Status: Appears within functional limits for tasks assessed Arousal/Alertness: Awake/alert Orientation Level: Oriented X4  Sensation/Coordination/Edema Sensation Light Touch: Appears Intact (Ulnar digit numbness.) Coordination Gross Motor Movements are Fluid and Coordinated: Yes Fine Motor Movements are Fluid and Coordinated: Yes  Additional Assessments RUE Assessment RUE Assessment: Exceptions to [facility] RUE PROM (degrees) Right Shoulder Extension 0-60: 60 Degrees Right Shoulder Flexion  0-170: 180 Degrees Right Shoulder ABduction 0-140: 180 Degrees Right Shoulder Internal Rotation  0-70: 70 Degrees Right Shoulder External Rotation  0-90: 60 Degrees Right Composite Finger Extension:  (WNL) Right Composite Finger Flexion:  (WNL) Right Thumb Opposition: Digit 5 RUE Strength Right Shoulder Flexion: 5/5 Right Shoulder Extension: 5/5 Right Shoulder ABduction: 5/5 Right Shoulder Internal Rotation: 5/5 Right Shoulder External Rotation: 5/5 Right Shoulder Horizontal ABduction: 5/5 Right Shoulder Horizontal ADduction: 5/5 Right Elbow Flexion: 5/5 Right Elbow Extension: 5/5 Right Forearm Pronation: 5/5 Right Forearm Supination: 5/5 Right Wrist Flexion: 5/5 Right Wrist Extension: 5/5 Right Wrist Radial Deviation: 5/5 Right Wrist Ulnar Deviation: 5/5 Gross Grasp: Functional Grip (lbs): 70 Lateral Pinch: 18 lbs 3 Point Pinch:  (WFL) LUE Assessment LUE Assessment: Within Functional Limits Palpation Palpation:  (no restrictions)  Exercise/Treatments Supine Protraction: AROM;Strengthening;10 reps;Weights Protraction Weight (lbs): 2 Horizontal ABduction: AROM;Strengthening;10  reps;Weights Horizontal ABduction Weight (lbs): 2 External Rotation: Strengthening;10 reps;Weights External Rotation Weight (lbs): 2 Internal Rotation: Strengthening;10 reps;Weights Internal Rotation Weight (lbs): 2 Flexion: Strengthening;10 reps;Weights Shoulder Flexion Weight (lbs): 2 ABduction: Strengthening;5 reps;Weights Shoulder ABduction Weight (lbs): 1 Seated  ROM / Strengthening / Isometric Strengthening UBE (Upper Arm Bike): forward 4 and backward 4  level 2  Occupational Therapy Assessment and Plan OT Assessment and Plan  Clinical Impression Statement: A: Patient arrived and left without pain and  has regained full mobility and 5/5 strength. Rehab Potential: Good Clinical Impairments Affecting Rehab Potential: No more guarding and full functional return of RUE use. OT Plan: P: Discharge home as patient has met all goals and does not even need a HEP   Goals Home Exercise Program Pt will Perform Home Exercise Program: Independently Short Term Goals Time to Complete Short Term Goals: 2 weeks Short Term Goal 1: Pateint will note decreased pain at 5/10 from 8/10  in right shoulder. Short Term Goal 1 Progress: Met Short Term Goal 2: Patient will be able to stop wearing the sling through out the day and position RUE at least 30 degrees from the body when at rest. Short Term Goal 2 Progress: Met Short Term Goal 3: Patient will decrease facial restrictions from max to mod. Short Term Goal 3 Progress: Met Short Term Goal 4: Pateint will RUE shoulder flexion to 90 degrees and Abduction to 80 degrees AAROM Short Term Goal 4 Progress: Met Short Term Goal 5: Pateint will be able to be Independent with AAROM to shoulder. Short Term Goal 5 Progress: Met Long Term Goals Time to Complete Long Term Goals: 8 weeks Long Term Goal 1: Pateint will regain functional AROM of RUE shoulder for self grooming with ability to do her own hair. Long Term Goal 1 Progress: Met Long Term Goal 2:  Increased RUE shoulder strength to 4/5. Long Term Goal 2 Progress: Met Long Term Goal 3: Increased RUE grip to 40 lbs and pinch to 10 lbs. Long Term Goal 3 Progress: Met Long Term Goal 4: Patient will regain pronation / supination / lex/ ext of wrist to 5/5 Long Term Goal 4 Progress: Met Long Term Goal 5: Pateint will resume ADL's with pain decreased to 2/10 Long Term Goal 5 Progress: Met  Problem List Patient Active Problem List  Diagnoses  . NEOPLASMS UNSPEC NATURE BONE SOFT TISSUE&SKIN  . KNEE, ARTHRITIS, DEGEN./OSTEO  . DERANGEMENT MENISCUS  . JOINT EFFUSION, LEFT KNEE  . PATELLO-FEMORAL SYNDROME  . H N P-LUMBAR  . LOW BACK PAIN  . BUNION  . TEAR MEDIAL MENISCUS    End of Session Activity Tolerance: Patient tolerated treatment well General Behavior During Session: [facility] for tasks performed Cognition: [facility] for tasks performed  Rubye Oaks, DeborahOTR/L 11/08/2011, 10:04 AM  Physician Documentation Your signature is required to indicate approval of the treatment plan as stated above.  Please sign and either send electronically or make a copy of this report for your files and return this physician signed original.  Please mark one 1.__approve of plan  2. ___approve of plan with the following conditions.   ______________________________                                                          _____________________ Physician Signature  Date  

## 2011-11-08 NOTE — Evaluation (Signed)
Occupational Therapy Re-Evaluation / Discharge  Patient Details  Name: Kirsten Walls MRN: [id number] Date of Birth: 07-Jun-1966  Today's Date: 11/08/2011 Time: 0930-1000 Re-evaluation 930-9:4515 min Therapeutic Exercise [phone] 15' Time Calculation (min): 30 min  Visit#: 7  of 12   Re-eval: 11/08/11  Assessment Diagnosis:  (RIGHT SHOULDER BURSITIS) Next MD Visit:  (Unknown) Prior Therapy:  (None)  Past Medical History: No past medical history on file. Past Surgical History:  Past Surgical History  Procedure Date  . Cesarean section x2  . Bunionectomy both big toe  . Abdominal hysterectomy   . Tumor removal left knee cap  . Knee surgery   . Foot surgery     Subjective Symptoms/Limitations Symptoms: A: "I have been doing my exercises at home. Check it out (lifts arms up in the air.) I don't have any pain" Repetition: Decreases Symptoms Pain Assessment Currently in Pain?: No/denies Pain Score: 0-No pain  Precautions/Restrictions None Precautions Precautions:  (None)  Prior Functioning Fully functionaling Home Living Lives With: Family Receives Help From: Family Type of Home: Apartment Home Layout: Two level Alternate Level Stairs-Rails: Left (asending) Alternate Level Stairs-Number of Steps:  (19) Home Access: Stairs to enter Entrance Stairs-Rails: Left Entrance Stairs-Number of Steps:  (19) Bathroom Shower/Tub: Tub/shower unit Prior Function Level of Independence: Independent with basic ADLs;Independent with homemaking with ambulation Able to Take Stairs?: Yes Driving: Yes Vocation: On disability Leisure: Hobbies-yes (Comment)  Assessment ADL/Vision/Perception ADL Eating/Feeding: Independent Grooming: Minimal assistance (difficulty doing her own hair.) Upper Body Bathing: Independent Lower Body Bathing: Independent Lower Body Dressing: Independent Toilet Transfer: Independent Toileting - Clothing Manipulation: Independent Toileting - Hygiene:  Independent Tub/Shower Transfer: Independent Vision - History Baseline Vision: Wears glasses all the time Vision - Assessment Eye Alignment: Within Functional Limits Perception Perception: Within Functional Limits Praxis Praxis: Intact  Cognition/Observation Cognition Overall Cognitive Status: Appears within functional limits for tasks assessed Arousal/Alertness: Awake/alert Orientation Level: Oriented X4  Sensation/Coordination/Edema Sensation Light Touch: Appears Intact (Ulnar digit numbness.) Coordination Gross Motor Movements are Fluid and Coordinated: Yes Fine Motor Movements are Fluid and Coordinated: Yes  Additional Assessments RUE Assessment RUE Assessment: Exceptions to [facility] RUE PROM (degrees) Right Shoulder Extension 0-60: 60 Degrees Right Shoulder Flexion  0-170: 180 Degrees Right Shoulder ABduction 0-140: 180 Degrees Right Shoulder Internal Rotation  0-70: 70 Degrees Right Shoulder External Rotation  0-90: 60 Degrees Right Composite Finger Extension:  (WNL) Right Composite Finger Flexion:  (WNL) Right Thumb Opposition: Digit 5 RUE Strength Right Shoulder Flexion: 5/5 Right Shoulder Extension: 5/5 Right Shoulder ABduction: 5/5 Right Shoulder Internal Rotation: 5/5 Right Shoulder External Rotation: 5/5 Right Shoulder Horizontal ABduction: 5/5 Right Shoulder Horizontal ADduction: 5/5 Right Elbow Flexion: 5/5 Right Elbow Extension: 5/5 Right Forearm Pronation: 5/5 Right Forearm Supination: 5/5 Right Wrist Flexion: 5/5 Right Wrist Extension: 5/5 Right Wrist Radial Deviation: 5/5 Right Wrist Ulnar Deviation: 5/5 Gross Grasp: Functional Grip (lbs): 70 Lateral Pinch: 18 lbs 3 Point Pinch:  (WFL) LUE Assessment LUE Assessment: Within Functional Limits Palpation Palpation:  (no restrictions)  Exercise/Treatments Supine Protraction: AROM;Strengthening;10 reps;Weights Protraction Weight (lbs): 2 Horizontal ABduction: AROM;Strengthening;10  reps;Weights Horizontal ABduction Weight (lbs): 2 External Rotation: Strengthening;10 reps;Weights External Rotation Weight (lbs): 2 Internal Rotation: Strengthening;10 reps;Weights Internal Rotation Weight (lbs): 2 Flexion: Strengthening;10 reps;Weights Shoulder Flexion Weight (lbs): 2 ABduction: Strengthening;5 reps;Weights Shoulder ABduction Weight (lbs): 1 Seated  ROM / Strengthening / Isometric Strengthening UBE (Upper Arm Bike): forward 4 and backward 4  level 2  Occupational Therapy Assessment and Plan OT Assessment and Plan  Clinical Impression Statement: A: Patient arrived and left without pain and  has regained full mobility and 5/5 strength. Rehab Potential: Good Clinical Impairments Affecting Rehab Potential: No more guarding and full functional return of RUE use. OT Plan: P: Discharge home as patient has met all goals and does not even need a HEP   Goals Home Exercise Program Pt will Perform Home Exercise Program: Independently Short Term Goals Time to Complete Short Term Goals: 2 weeks Short Term Goal 1: Pateint will note decreased pain at 5/10 from 8/10  in right shoulder. Short Term Goal 1 Progress: Met Short Term Goal 2: Patient will be able to stop wearing the sling through out the day and position RUE at least 30 degrees from the body when at rest. Short Term Goal 2 Progress: Met Short Term Goal 3: Patient will decrease facial restrictions from max to mod. Short Term Goal 3 Progress: Met Short Term Goal 4: Pateint will RUE shoulder flexion to 90 degrees and Abduction to 80 degrees AAROM Short Term Goal 4 Progress: Met Short Term Goal 5: Pateint will be able to be Independent with AAROM to shoulder. Short Term Goal 5 Progress: Met Long Term Goals Time to Complete Long Term Goals: 8 weeks Long Term Goal 1: Pateint will regain functional AROM of RUE shoulder for self grooming with ability to do her own hair. Long Term Goal 1 Progress: Met Long Term Goal 2:  Increased RUE shoulder strength to 4/5. Long Term Goal 2 Progress: Met Long Term Goal 3: Increased RUE grip to 40 lbs and pinch to 10 lbs. Long Term Goal 3 Progress: Met Long Term Goal 4: Patient will regain pronation / supination / lex/ ext of wrist to 5/5 Long Term Goal 4 Progress: Met Long Term Goal 5: Pateint will resume ADL's with pain decreased to 2/10 Long Term Goal 5 Progress: Met  Problem List Patient Active Problem List  Diagnoses  . NEOPLASMS UNSPEC NATURE BONE SOFT TISSUE&SKIN  . KNEE, ARTHRITIS, DEGEN./OSTEO  . DERANGEMENT MENISCUS  . JOINT EFFUSION, LEFT KNEE  . PATELLO-FEMORAL SYNDROME  . H N P-LUMBAR  . LOW BACK PAIN  . BUNION  . TEAR MEDIAL MENISCUS    End of Session Activity Tolerance: Patient tolerated treatment well General Behavior During Session: [facility] for tasks performed Cognition: [facility] for tasks performed  Rubye Oaks, DeborahOTR/L 11/08/2011, 10:05 AM  Physician Documentation Your signature is required to indicate approval of the treatment plan as stated above.  Please sign and either send electronically or make a copy of this report for your files and return this physician signed original.  Please mark one 1.__approve of plan  2. ___approve of plan with the following conditions.   ______________________________                                                          _____________________ Physician Signature  Date  

## 2011-11-10 ENCOUNTER — Ambulatory Visit (HOSPITAL_COMMUNITY): Payer: Medicare Other | Admitting: Occupational Therapy

## 2011-11-13 ENCOUNTER — Ambulatory Visit (HOSPITAL_COMMUNITY): Payer: Medicare Other | Admitting: Occupational Therapy

## 2011-11-15 ENCOUNTER — Ambulatory Visit (HOSPITAL_COMMUNITY): Payer: Medicare Other | Admitting: Occupational Therapy

## 2011-11-17 ENCOUNTER — Ambulatory Visit (HOSPITAL_COMMUNITY): Payer: Medicare Other | Admitting: Occupational Therapy

## 2011-11-20 ENCOUNTER — Ambulatory Visit (HOSPITAL_COMMUNITY): Payer: Medicare Other | Admitting: Occupational Therapy

## 2011-11-22 ENCOUNTER — Ambulatory Visit (HOSPITAL_COMMUNITY): Payer: Medicare Other | Admitting: Occupational Therapy

## 2011-11-24 ENCOUNTER — Ambulatory Visit (HOSPITAL_COMMUNITY): Payer: Medicare Other | Admitting: Occupational Therapy

## 2011-11-27 ENCOUNTER — Ambulatory Visit (HOSPITAL_COMMUNITY): Payer: Medicare Other | Admitting: Occupational Therapy

## 2011-11-29 ENCOUNTER — Ambulatory Visit (HOSPITAL_COMMUNITY): Payer: Medicare Other | Admitting: Occupational Therapy

## 2011-12-01 ENCOUNTER — Ambulatory Visit (HOSPITAL_COMMUNITY): Payer: Medicare Other | Admitting: Occupational Therapy

## 2012-03-27 ENCOUNTER — Other Ambulatory Visit: Payer: Self-pay | Admitting: Neurology

## 2012-03-27 DIAGNOSIS — R55 Syncope and collapse: Secondary | ICD-10-CM

## 2012-04-01 ENCOUNTER — Ambulatory Visit (HOSPITAL_COMMUNITY)
Admission: RE | Admit: 2012-04-01 | Discharge: 2012-04-01 | Disposition: A | Discharge status: Home / Self Care | Payer: Medicare Other | Source: Ambulatory Visit | Attending: Neurology | Admitting: Neurology

## 2012-04-01 DIAGNOSIS — R55 Syncope and collapse: Secondary | ICD-10-CM

## 2012-04-08 ENCOUNTER — Ambulatory Visit (HOSPITAL_COMMUNITY)
Admission: RE | Admit: 2012-04-08 | Discharge: 2012-04-08 | Disposition: A | Discharge status: Home / Self Care | Payer: Medicare Other | Source: Ambulatory Visit | Attending: Neurology | Admitting: Neurology

## 2012-04-08 DIAGNOSIS — R55 Syncope and collapse: Secondary | ICD-10-CM | POA: Insufficient documentation

## 2012-04-08 DIAGNOSIS — R51 Headache: Secondary | ICD-10-CM | POA: Insufficient documentation

## 2012-04-08 MED ORDER — GADOBENATE DIMEGLUMINE 529 MG/ML IV SOLN: 14.0000 mL | Freq: Once | INTRAVENOUS | Status: AC | PRN

## 2012-09-02 ENCOUNTER — Other Ambulatory Visit (HOSPITAL_COMMUNITY): Payer: Self-pay | Admitting: Family Medicine

## 2012-09-02 DIAGNOSIS — Z139 Encounter for screening, unspecified: Secondary | ICD-10-CM

## 2012-09-09 ENCOUNTER — Ambulatory Visit (HOSPITAL_COMMUNITY)
Admission: RE | Admit: 2012-09-09 | Discharge: 2012-09-09 | Disposition: A | Discharge status: Home / Self Care | Payer: Medicare Other | Source: Ambulatory Visit | Attending: Family Medicine | Admitting: Family Medicine

## 2012-09-09 ENCOUNTER — Emergency Department (HOSPITAL_COMMUNITY)
Admission: EM | Admit: 2012-09-09 | Discharge: 2012-09-09 | Disposition: A | Discharge status: Home / Self Care | Payer: Medicare Other | Attending: Emergency Medicine | Admitting: Emergency Medicine

## 2012-09-09 ENCOUNTER — Other Ambulatory Visit (HOSPITAL_COMMUNITY): Payer: Self-pay | Admitting: Family Medicine

## 2012-09-09 ENCOUNTER — Encounter (HOSPITAL_COMMUNITY): Payer: Self-pay | Admitting: *Deleted

## 2012-09-09 DIAGNOSIS — B349 Viral infection, unspecified: Secondary | ICD-10-CM

## 2012-09-09 DIAGNOSIS — R5381 Other malaise: Secondary | ICD-10-CM | POA: Insufficient documentation

## 2012-09-09 DIAGNOSIS — R51 Headache: Secondary | ICD-10-CM | POA: Insufficient documentation

## 2012-09-09 DIAGNOSIS — J3489 Other specified disorders of nose and nasal sinuses: Secondary | ICD-10-CM | POA: Insufficient documentation

## 2012-09-09 DIAGNOSIS — Z9889 Other specified postprocedural states: Secondary | ICD-10-CM | POA: Insufficient documentation

## 2012-09-09 DIAGNOSIS — B9789 Other viral agents as the cause of diseases classified elsewhere: Secondary | ICD-10-CM | POA: Insufficient documentation

## 2012-09-09 DIAGNOSIS — F17200 Nicotine dependence, unspecified, uncomplicated: Secondary | ICD-10-CM | POA: Insufficient documentation

## 2012-09-09 DIAGNOSIS — R42 Dizziness and giddiness: Secondary | ICD-10-CM | POA: Insufficient documentation

## 2012-09-09 DIAGNOSIS — R112 Nausea with vomiting, unspecified: Secondary | ICD-10-CM | POA: Insufficient documentation

## 2012-09-09 DIAGNOSIS — Z139 Encounter for screening, unspecified: Secondary | ICD-10-CM

## 2012-09-09 DIAGNOSIS — R197 Diarrhea, unspecified: Secondary | ICD-10-CM | POA: Insufficient documentation

## 2012-09-09 DIAGNOSIS — Z79899 Other long term (current) drug therapy: Secondary | ICD-10-CM | POA: Insufficient documentation

## 2012-09-09 DIAGNOSIS — IMO0001 Reserved for inherently not codable concepts without codable children: Secondary | ICD-10-CM | POA: Insufficient documentation

## 2012-09-09 DIAGNOSIS — J029 Acute pharyngitis, unspecified: Secondary | ICD-10-CM | POA: Insufficient documentation

## 2012-09-09 DIAGNOSIS — I10 Essential (primary) hypertension: Secondary | ICD-10-CM | POA: Insufficient documentation

## 2012-09-09 DIAGNOSIS — R059 Cough, unspecified: Secondary | ICD-10-CM | POA: Insufficient documentation

## 2012-09-09 DIAGNOSIS — Z1231 Encounter for screening mammogram for malignant neoplasm of breast: Secondary | ICD-10-CM | POA: Insufficient documentation

## 2012-09-09 DIAGNOSIS — R05 Cough: Secondary | ICD-10-CM | POA: Insufficient documentation

## 2012-09-09 DIAGNOSIS — R630 Anorexia: Secondary | ICD-10-CM | POA: Insufficient documentation

## 2012-09-09 HISTORY — DX: Essential (primary) hypertension: I10

## 2012-09-09 MED ORDER — ONDANSETRON HCL 4 MG PO TABS: 4.0000 mg | ORAL_TABLET | Freq: Four times a day (QID) | ORAL | Status: DC

## 2012-09-09 MED ORDER — ONDANSETRON 8 MG PO TBDP
8.0000 mg | ORAL_TABLET | Freq: Once | ORAL | Status: AC
  Administered 2012-09-09: 8 mg via ORAL
  Filled 2012-09-09: qty 1

## 2012-09-09 NOTE — ED Provider Notes (Signed)
History   This chart was scribed for Flint Melter, MD by Charolett Bumpers, ED Scribe. The patient was seen in room APA14/APA14. Patient's care was started at 1343.   CSN: [id number]  Arrival date & time 09/09/12  1309   First MD Initiated Contact with Patient 09/09/12 1343      Chief Complaint  Patient presents with  . Fever   The history is provided by the patient. No language interpreter was used.  Dina Warbington is a 46 y.o. female who presents to the Emergency Department complaining of persistent, moderate fever for the past 2 days. Temperature here in ED is 99. She reports associated body aches, generalized weakness, dizziness, nausea, vomiting, diarrhea, productive cough with green phlegm, sneezing, sore throat, headache and decreased appetite. She reports a h/o HTN and reports taking medication for it. She states that she has not gotten a flu vaccine this year.   PCP: Dr. Renard Matter  Past Medical History  Diagnosis Date  . Hypertension     Past Surgical History  Procedure Date  . Cesarean section x2  . Bunionectomy both big toe  . Abdominal hysterectomy   . Tumor removal left knee cap  . Knee surgery   . Foot surgery     Family History  Problem Relation Age of Onset  . Asthma      History  Substance Use Topics  . Smoking status: Current Every Day Smoker  . Smokeless tobacco: Not on file  . Alcohol Use: No    OB History    Grav Para Term Preterm Abortions TAB SAB Ect Mult Living                  Review of Systems  Constitutional: Positive for fever and appetite change.  HENT: Positive for sore throat and sneezing.   Respiratory: Positive for cough.   Gastrointestinal: Positive for nausea, vomiting and diarrhea.  Musculoskeletal: Positive for myalgias.  Neurological: Positive for dizziness, weakness and headaches.  All other systems reviewed and are negative.    Allergies  Penicillins  Home Medications   Current Outpatient Rx  Name   Route  Sig  Dispense  Refill  . ALBUTEROL SULFATE HFA 108 (90 BASE) MCG/ACT IN AERS   Inhalation   Inhale 2 puffs into the lungs every 6 (six) hours as needed. For shortness of breath/asthma/bronchitis          . ALPRAZOLAM 0.5 MG PO TABS   Oral   Take 0.5 mg by mouth at bedtime as needed. For sleep          . BUTALBITAL-APAP-CAFFEINE 50-325-40 MG PO TABS   Oral   Take 1-2 tablets by mouth every 6 (six) hours as needed for headache.   20 tablet   0   . HYDROCODONE-ACETAMINOPHEN 5-325 MG PO TABS   Oral   Take 1 tablet by mouth every 6 (six) hours as needed. For pain         . IBUPROFEN 800 MG PO TABS   Oral   Take 800 mg by mouth every 8 (eight) hours as needed. For pain         . PANTOPRAZOLE SODIUM 40 MG PO TBEC   Oral   Take 40 mg by mouth 2 (two) times daily.           Marland Kitchen ONDANSETRON HCL 4 MG PO TABS   Oral   Take 1 tablet (4 mg total) by mouth every 6 (six) hours.  12 tablet   0     BP 107/81  Pulse 84  Temp 99 F (37.2 C) (Oral)  Resp 20  Ht 5\' 3"  (1.6 m)  Wt 144 lb (65.318 kg)  BMI 25.51 kg/m2  SpO2 100%  Physical Exam  Nursing note and vitals reviewed. Constitutional: She is oriented to person, place, and time. She appears well-developed and well-nourished. No distress.  HENT:  Head: Normocephalic and atraumatic.  Eyes: Conjunctivae normal and EOM are normal.  Neck: Normal range of motion. Neck supple. No tracheal deviation present.  Cardiovascular: Normal rate, regular rhythm and normal heart sounds.   No murmur heard. Pulmonary/Chest: Effort normal and breath sounds normal. No respiratory distress. She has no wheezes. She has no rhonchi. She has no rales.       Coughs on exam.   Abdominal: Soft. Bowel sounds are normal. She exhibits no distension. There is no tenderness.  Musculoskeletal: Normal range of motion.  Neurological: She is alert and oriented to person, place, and time. No sensory deficit.  Skin: Skin is warm and dry.   Psychiatric: She has a normal mood and affect. Her behavior is normal.    ED Course  Procedures (including critical care time)  DIAGNOSTIC STUDIES: Oxygen Saturation is 100% on room air, normal by my interpretation.    COORDINATION OF CARE:  14:03-Discussed planned course of treatment with the patient including Zofran and offer fluids, who is agreeable at this time.   14:15-Medication Orders: Ondansetron (Zofran-ODT) disintegrating tablet 8 mg-once.   15:35-Recheck: Pt is tolerating fluids well. Will d/c home. Pt is agreeable with plan.   Labs Reviewed - No data to display Dg Chest 2 View  09/09/2012  *RADIOLOGY REPORT*  Clinical Data: Hypertension, tobacco use  CHEST - 2 VIEW  Comparison: None.  Findings: Lungs clear.  Heart size and pulmonary vascularity normal.  No effusion.  Visualized bones unremarkable.  IMPRESSION: No acute disease   Original Report Authenticated By: D. Andria Rhein, MD    Nursing notes, applicable records and vitals reviewed.  Radiologic Images/Reports reviewed.   1. Viral syndrome       MDM  Patient with nonspecific symptoms, both respiratory and GI. No clear evidence for influenza. Possible gastroenteritis. Her vital signs are stable and she is amenable to treatment, as an outpatient  I personally performed the services described in this documentation, which was scribed in my presence. The recorded information has been reviewed and is accurate.       Plan: Home Medications- Zofran Treatments- clear liquid diet; Recommended follow up- PCP, of choice, [place]       Flint Melter, MD 09/09/12 1747

## 2012-09-09 NOTE — ED Notes (Signed)
Patient is unsatisfied that she has not received any information from the doctor about her results. Patient was worried she would miss the [employer] bus and the Director of Nursing Assistant offered to give a cab voucher for a ride home when discharged. Patient is getting dressed and states that she will just leave momentarily if she does not receive any information soon. RN made aware.

## 2012-09-09 NOTE — ED Notes (Signed)
Patient would like to know how much longer before discharge because her ride is the [facility] bus and they close down at 1600. RN made aware.

## 2012-09-09 NOTE — ED Notes (Signed)
Fever, cough, nausea, vomiting, diarrhea.  Green sputum, chest "sore", sore throat.

## 2012-12-05 ENCOUNTER — Ambulatory Visit (HOSPITAL_COMMUNITY)
Admission: RE | Admit: 2012-12-05 | Discharge: 2012-12-05 | Disposition: A | Discharge status: Home / Self Care | Payer: Medicare Other | Source: Ambulatory Visit | Attending: Family Medicine | Admitting: Family Medicine

## 2012-12-05 ENCOUNTER — Other Ambulatory Visit (HOSPITAL_COMMUNITY): Payer: Self-pay | Admitting: Family Medicine

## 2012-12-05 DIAGNOSIS — M25539 Pain in unspecified wrist: Secondary | ICD-10-CM | POA: Insufficient documentation

## 2012-12-05 DIAGNOSIS — R52 Pain, unspecified: Secondary | ICD-10-CM

## 2013-09-15 ENCOUNTER — Other Ambulatory Visit: Payer: Self-pay

## 2013-09-15 DIAGNOSIS — Z1231 Encounter for screening mammogram for malignant neoplasm of breast: Secondary | ICD-10-CM

## 2013-09-30 DIAGNOSIS — R040 Epistaxis: Secondary | ICD-10-CM | POA: Diagnosis not present

## 2013-09-30 DIAGNOSIS — I10 Essential (primary) hypertension: Secondary | ICD-10-CM | POA: Diagnosis not present

## 2013-09-30 DIAGNOSIS — R569 Unspecified convulsions: Secondary | ICD-10-CM | POA: Diagnosis not present

## 2013-09-30 DIAGNOSIS — R51 Headache: Secondary | ICD-10-CM | POA: Diagnosis not present

## 2013-10-09 ENCOUNTER — Ambulatory Visit
Admission: RE | Admit: 2013-10-09 | Discharge: 2013-10-09 | Disposition: A | Discharge status: Home / Self Care | Payer: Medicare Other | Source: Ambulatory Visit

## 2013-10-09 DIAGNOSIS — Z1231 Encounter for screening mammogram for malignant neoplasm of breast: Secondary | ICD-10-CM | POA: Diagnosis not present

## 2013-10-10 ENCOUNTER — Other Ambulatory Visit: Payer: Self-pay | Admitting: Family Medicine

## 2013-10-10 DIAGNOSIS — R928 Other abnormal and inconclusive findings on diagnostic imaging of breast: Secondary | ICD-10-CM

## 2013-10-20 DIAGNOSIS — J209 Acute bronchitis, unspecified: Secondary | ICD-10-CM | POA: Diagnosis not present

## 2013-10-20 DIAGNOSIS — J029 Acute pharyngitis, unspecified: Secondary | ICD-10-CM | POA: Diagnosis not present

## 2013-10-20 DIAGNOSIS — J069 Acute upper respiratory infection, unspecified: Secondary | ICD-10-CM | POA: Diagnosis not present

## 2013-10-20 DIAGNOSIS — M1990 Unspecified osteoarthritis, unspecified site: Secondary | ICD-10-CM | POA: Diagnosis not present

## 2013-10-24 ENCOUNTER — Encounter (HOSPITAL_COMMUNITY): Payer: Self-pay | Admitting: Emergency Medicine

## 2013-10-24 ENCOUNTER — Emergency Department (HOSPITAL_COMMUNITY)
Admission: EM | Admit: 2013-10-24 | Discharge: 2013-10-24 | Disposition: A | Discharge status: Home / Self Care | Payer: Medicare Other | Attending: Emergency Medicine | Admitting: Emergency Medicine

## 2013-10-24 DIAGNOSIS — Z791 Long term (current) use of non-steroidal anti-inflammatories (NSAID): Secondary | ICD-10-CM | POA: Insufficient documentation

## 2013-10-24 DIAGNOSIS — R05 Cough: Secondary | ICD-10-CM | POA: Insufficient documentation

## 2013-10-24 DIAGNOSIS — J3489 Other specified disorders of nose and nasal sinuses: Secondary | ICD-10-CM | POA: Insufficient documentation

## 2013-10-24 DIAGNOSIS — I10 Essential (primary) hypertension: Secondary | ICD-10-CM | POA: Diagnosis not present

## 2013-10-24 DIAGNOSIS — J039 Acute tonsillitis, unspecified: Secondary | ICD-10-CM | POA: Insufficient documentation

## 2013-10-24 DIAGNOSIS — Z79899 Other long term (current) drug therapy: Secondary | ICD-10-CM | POA: Insufficient documentation

## 2013-10-24 DIAGNOSIS — Z880 Allergy status to penicillin: Secondary | ICD-10-CM | POA: Diagnosis not present

## 2013-10-24 DIAGNOSIS — R21 Rash and other nonspecific skin eruption: Secondary | ICD-10-CM | POA: Insufficient documentation

## 2013-10-24 DIAGNOSIS — F17200 Nicotine dependence, unspecified, uncomplicated: Secondary | ICD-10-CM | POA: Diagnosis not present

## 2013-10-24 DIAGNOSIS — R059 Cough, unspecified: Secondary | ICD-10-CM | POA: Insufficient documentation

## 2013-10-24 MED ORDER — LIDOCAINE VISCOUS 2 % MT SOLN: 10.0000 mL | OROMUCOSAL | Status: DC | PRN

## 2013-10-24 MED ORDER — LIDOCAINE VISCOUS 2 % MT SOLN
10.0000 mL | Freq: Once | OROMUCOSAL | Status: AC
  Administered 2013-10-24: 10 mL via OROMUCOSAL
  Filled 2013-10-24: qty 15

## 2013-10-24 MED ORDER — CEPHALEXIN 500 MG PO CAPS: 500.0000 mg | ORAL_CAPSULE | Freq: Three times a day (TID) | ORAL | Status: DC

## 2013-10-24 NOTE — Discharge Instructions (Signed)
Tonsillitis Tonsillitis is an infection of the throat. This infection causes the tonsils to become red, tender, and puffy (swollen). Tonsils are groups of tissue at the back of your throat. If bacteria caused your infection, antibiotic medicine will be given to you. Sometimes symptoms of tonsillitis can be relieved with the use of steroid medicine. If your tonsillitis is severe and happens often, you may need to get your tonsils removed (tonsillectomy). HOME CARE   Rest and sleep often.  Drink enough fluids to keep your pee (urine) clear or pale yellow.  While your throat is sore, eat soft or liquid foods like:  Soup.  Ice cream.  Instant breakfast drinks.  Eat frozen ice pops.  Gargle with a warm or cold liquid to help soothe the throat. Gargle with a water and salt mix. Mix 1/4 teaspoon of salt and 1/4 teaspoon of baking soda in 1 cup of water.  Only take medicines as told by your doctor.  If you are given medicines (antibiotics), take them as told. Finish them even if you start to feel better. GET HELP RIGHT AWAY IF:   You throw up (vomit).  You have a very bad headache.  You have a stiff neck.  You have chest pain.  You have trouble breathing or swallowing.  You have bad throat pain, drooling, or your voice changes.  You have bad pain not helped by medicine.  You cannot fully open your mouth.  You have redness, puffiness, or bad pain in the neck.  You have a fever.  You have a rash.  You cough up green, yellow-brown, or bloody fluid.  You cannot swallow liquids or food for 24 hours.  You notice that only one of your tonsils is swollen. MAKE SURE YOU:   Understand these instructions.  Will watch your condition.  Will get help right away if you are not doing well or get worse. Document Released: 02/21/2008 Document Revised: 05/07/2013 Document Reviewed: 02/21/2013 [facility] Patient Information 2014 [place], [place].

## 2013-10-24 NOTE — ED Provider Notes (Signed)
CSN: [id number]     Arrival date & time 10/24/13  [id number] History   First MD Initiated Contact with Patient 10/24/13 [phone]     Chief Complaint  Patient presents with  . Sore Throat   (Consider location/radiation/quality/duration/timing/severity/associated sxs/prior Treatment) Patient is a 48 y.o. female presenting with pharyngitis. The history is provided by the patient.  Sore Throat This is a new problem. The current episode started in the past 7 days. The problem occurs constantly. The problem has been unchanged. Associated symptoms include coughing, a rash, a sore throat and swollen glands. Pertinent negatives include no abdominal pain, arthralgias, chest pain, chills, congestion, diaphoresis, fever, headaches, joint swelling, myalgias, nausea, neck pain, numbness, urinary symptoms, vomiting or weakness. The symptoms are aggravated by swallowing. She has tried oral narcotics (zithromycin) for the symptoms. The treatment provided no relief.   Patient c/o sore throat and pain when swallowing for one week.  States she saw her PMD, Dr. Everette Rank, on 10/20/13 for same and was given zithromax.  She has taken 4 pills so far and noticed an itchy rash to her arms and legs two days ago, so she stopped the medication.  She states that her tonsils are very painful and bleed when she tries to eat.  She states that nothing makes her symptoms better. She denies fever, vomiting, nasal congestion or abdominal pain.  She also c/o intermittent cough and is currently taking OTC Robuttussin with moderate relief.     Past Medical History  Diagnosis Date  . Hypertension    Past Surgical History  Procedure Laterality Date  . Cesarean section  x2  . Bunionectomy  both big toe  . Abdominal hysterectomy    . Tumor removal  left knee cap  . Knee surgery    . Foot surgery     Family History  Problem Relation Age of Onset  . Asthma     History  Substance Use Topics  . Smoking status: Current Every Day Smoker -- 1.00  packs/day for 30 years    Types: Cigarettes  . Smokeless tobacco: Never Used  . Alcohol Use: No   OB History   Grav Para Term Preterm Abortions TAB SAB Ect Mult Living   4 4 4       4      Review of Systems  Constitutional: Negative for fever, chills, diaphoresis, activity change and appetite change.  HENT: Positive for rhinorrhea and sore throat. Negative for congestion, facial swelling, trouble swallowing and voice change.   Eyes: Negative for visual disturbance.  Respiratory: Positive for cough. Negative for chest tightness, shortness of breath, wheezing and stridor.   Cardiovascular: Negative for chest pain.  Gastrointestinal: Negative for nausea, vomiting, abdominal pain and abdominal distention.  Musculoskeletal: Negative for arthralgias, joint swelling, myalgias, neck pain and neck stiffness.  Skin: Positive for rash.  Neurological: Negative for dizziness, syncope, speech difficulty, weakness, numbness and headaches.  Hematological: Positive for adenopathy.  Psychiatric/Behavioral: Negative for confusion.  All other systems reviewed and are negative.    Allergies  Penicillins and Azithromycin  Home Medications   Current Outpatient Rx  Name  Route  Sig  Dispense  Refill  . albuterol (VENTOLIN HFA) 108 (90 BASE) MCG/ACT inhaler   Inhalation   Inhale 2 puffs into the lungs every 6 (six) hours as needed. For shortness of breath/asthma/bronchitis          . ALPRAZolam (XANAX) 0.5 MG tablet   Oral   Take 0.5 mg by mouth at bedtime  as needed. For sleep          . amLODipine (NORVASC) 5 MG tablet   Oral   Take 5 mg by mouth daily.         Marland Kitchen guaiFENesin-dextromethorphan (ROBITUSSIN DM) 100-10 MG/5ML syrup   Oral   Take 5 mLs by mouth at bedtime as needed for cough.          Marland Kitchen HYDROcodone-acetaminophen (NORCO/VICODIN) 5-325 MG per tablet   Oral   Take 1 tablet by mouth every 6 (six) hours as needed for moderate pain.         . hydrOXYzine (ATARAX/VISTARIL)  50 MG tablet   Oral   Take 50 mg by mouth every 4 (four) hours as needed for itching.         Marland Kitchen ibuprofen (ADVIL,MOTRIN) 800 MG tablet   Oral   Take 800 mg by mouth 3 (three) times daily. For pain         . pantoprazole (PROTONIX) 40 MG tablet   Oral   Take 40 mg by mouth 2 (two) times daily.           Marland Kitchen tiZANidine (ZANAFLEX) 2 MG tablet   Oral   Take 2 mg by mouth every 8 (eight) hours.         . topiramate (TOPAMAX) 25 MG tablet   Oral   Take 100 mg by mouth daily.          BP 114/84  Pulse 64  Temp(Src) 98.3 F (36.8 C) (Oral)  Resp 18  Ht 5\' 3"  (1.6 m)  Wt 138 lb (62.596 kg)  BMI 24.45 kg/m2  SpO2 100% Physical Exam  Nursing note and vitals reviewed. Constitutional: She is oriented to person, place, and time. She appears well-developed and well-nourished. No distress.  HENT:  Head: Normocephalic and atraumatic.  Right Ear: Tympanic membrane and ear canal normal.  Left Ear: Tympanic membrane and ear canal normal.  Nose: Mucosal edema present. No rhinorrhea. No epistaxis.  Mouth/Throat: Uvula is midline and mucous membranes are normal. No oral lesions. No trismus in the jaw. No uvula swelling. Oropharyngeal exudate, posterior oropharyngeal edema and posterior oropharyngeal erythema present. No tonsillar abscesses.  Edema and erythema of the bilateral tonsils.  Exudate present bilaterally.  Uvula is midline.  No PTA.    Neck: Normal range of motion and phonation normal. Neck supple. Normal range of motion present. No Brudzinski's sign and no Kernig's sign noted.  Cardiovascular: Normal rate, regular rhythm, normal heart sounds and intact distal pulses.   No murmur heard. Pulmonary/Chest: Effort normal. No respiratory distress. She has no wheezes. She has no rales. She exhibits no tenderness.  Abdominal: Soft. Normal appearance. She exhibits no distension. There is no splenomegaly. There is no tenderness.  Musculoskeletal: Normal range of motion.   Lymphadenopathy:    She has cervical adenopathy.       Right cervical: Superficial cervical adenopathy present.       Left cervical: Superficial cervical adenopathy present.  Neurological: She is alert and oriented to person, place, and time. She exhibits normal muscle tone. Coordination normal.  Skin: Skin is warm and dry.    ED Course  Procedures (including critical care time) Labs Review Labs Reviewed - No data to display Imaging Review No results found.  EKG Interpretation   None       MDM   VSS.  Pt is non-toxic appearing.  NO PTA.  Airway patent.    Pain improved after viscous  lidocaine.  Handles secretions well.  She appears stable for discharge.  No significant rash, no hives.  Pt has stopped zithromax, will change to keflex and visous lidocaine prescribed with strict precautions to not swallow.  Pt agrees to care plan and verbalized understanding.     Urias Sheek L. Vanessa [place], PA-C 10/25/13 2012

## 2013-10-24 NOTE — ED Notes (Signed)
Patient c/o sore throat.  Denies any fevers. Patient states that she "spits" because its to painful to swallow. Tonsils red and swollen with white exudate noted. Patient reports seen Dr Everette Rank on 10/20/13 and getting azithromycin in which made her break out in rash. Patient reports stopping medication. Per patient throat continuing to get worse.

## 2013-10-27 NOTE — ED Provider Notes (Signed)
Medical screening examination/treatment/procedure(s) were performed by non-physician practitioner and as supervising physician I was immediately available for consultation/collaboration.  EKG Interpretation   None         Sharyon Cable, MD 10/27/13 2211

## 2013-11-05 ENCOUNTER — Encounter (HOSPITAL_COMMUNITY): Payer: Medicare Other

## 2013-11-25 ENCOUNTER — Other Ambulatory Visit (HOSPITAL_COMMUNITY): Payer: Self-pay | Admitting: Family Medicine

## 2013-11-25 ENCOUNTER — Ambulatory Visit (HOSPITAL_COMMUNITY)
Admission: RE | Admit: 2013-11-25 | Discharge: 2013-11-25 | Disposition: A | Discharge status: Home / Self Care | Payer: Medicare Other | Source: Ambulatory Visit | Attending: Family Medicine | Admitting: Family Medicine

## 2013-11-25 DIAGNOSIS — M7989 Other specified soft tissue disorders: Secondary | ICD-10-CM

## 2013-11-25 DIAGNOSIS — M79609 Pain in unspecified limb: Secondary | ICD-10-CM | POA: Insufficient documentation

## 2013-11-25 DIAGNOSIS — M79673 Pain in unspecified foot: Secondary | ICD-10-CM

## 2013-11-25 DIAGNOSIS — S9030XA Contusion of unspecified foot, initial encounter: Secondary | ICD-10-CM | POA: Diagnosis not present

## 2013-12-08 ENCOUNTER — Ambulatory Visit
Admission: RE | Admit: 2013-12-08 | Discharge: 2013-12-08 | Disposition: A | Discharge status: Home / Self Care | Payer: Medicare Other | Source: Ambulatory Visit | Attending: Family Medicine | Admitting: Family Medicine

## 2013-12-08 DIAGNOSIS — R928 Other abnormal and inconclusive findings on diagnostic imaging of breast: Secondary | ICD-10-CM

## 2013-12-08 DIAGNOSIS — R922 Inconclusive mammogram: Secondary | ICD-10-CM | POA: Diagnosis not present

## 2013-12-09 ENCOUNTER — Encounter (HOSPITAL_COMMUNITY): Payer: Self-pay | Admitting: Emergency Medicine

## 2013-12-09 ENCOUNTER — Emergency Department (HOSPITAL_COMMUNITY)
Admission: EM | Admit: 2013-12-09 | Discharge: 2013-12-09 | Disposition: A | Discharge status: Home / Self Care | Payer: Medicare Other | Attending: Emergency Medicine | Admitting: Emergency Medicine

## 2013-12-09 DIAGNOSIS — F17200 Nicotine dependence, unspecified, uncomplicated: Secondary | ICD-10-CM | POA: Insufficient documentation

## 2013-12-09 DIAGNOSIS — L509 Urticaria, unspecified: Secondary | ICD-10-CM | POA: Diagnosis not present

## 2013-12-09 DIAGNOSIS — IMO0002 Reserved for concepts with insufficient information to code with codable children: Secondary | ICD-10-CM | POA: Insufficient documentation

## 2013-12-09 DIAGNOSIS — I10 Essential (primary) hypertension: Secondary | ICD-10-CM | POA: Insufficient documentation

## 2013-12-09 DIAGNOSIS — Z79899 Other long term (current) drug therapy: Secondary | ICD-10-CM | POA: Diagnosis not present

## 2013-12-09 DIAGNOSIS — Z880 Allergy status to penicillin: Secondary | ICD-10-CM | POA: Insufficient documentation

## 2013-12-09 MED ORDER — PREDNISONE 10 MG PO TABS: 20.0000 mg | ORAL_TABLET | Freq: Every day | ORAL | Status: DC

## 2013-12-09 MED ORDER — DIPHENHYDRAMINE-ZINC ACETATE 1-0.1 % EX CREA: TOPICAL_CREAM | Freq: Three times a day (TID) | CUTANEOUS | Status: DC | PRN

## 2013-12-09 MED ORDER — LORATADINE 10 MG PO TABS: 10.0000 mg | ORAL_TABLET | Freq: Every day | ORAL | Status: DC

## 2013-12-09 MED ORDER — PREDNISONE 50 MG PO TABS
60.0000 mg | ORAL_TABLET | Freq: Once | ORAL | Status: AC
  Administered 2013-12-09: 60 mg via ORAL
  Filled 2013-12-09 (×2): qty 1

## 2013-12-09 NOTE — ED Provider Notes (Signed)
CSN: [id number]     Arrival date & time 12/09/13  1057 History   First MD Initiated Contact with Patient 12/09/13 1100     Chief Complaint  Patient presents with  . Rash     (Consider location/radiation/quality/duration/timing/severity/associated sxs/prior Treatment) HPI  Agent with a past medical history of skin sensitivity, eczema, exercise-induced hives presents to the emergency department with complaints of urticaria. She's tried Atarax and hydrocortisone cream at home. She's also tried Benadryl with no relief. She is having discomfort due to the itching. She's not having any pain, shortness of breath, swelling of the face lips, or throat. No shortness of breath or wheezing. She says it has been persisting for 3-4 days and nothing she is trying has been relieving her symptoms  Past Medical History  Diagnosis Date  . Hypertension    Past Surgical History  Procedure Laterality Date  . Cesarean section  x2  . Bunionectomy  both big toe  . Abdominal hysterectomy    . Tumor removal  left knee cap  . Knee surgery    . Foot surgery     Family History  Problem Relation Age of Onset  . Asthma     History  Substance Use Topics  . Smoking status: Current Every Day Smoker -- 1.00 packs/day for 30 years    Types: Cigarettes  . Smokeless tobacco: Never Used  . Alcohol Use: No   OB History   Grav Para Term Preterm Abortions TAB SAB Ect Mult Living   4 4 4       4      Review of Systems  The patient denies anorexia, fever, weight loss,, vision loss, decreased hearing, hoarseness, chest pain, syncope, dyspnea on exertion, peripheral edema, balance deficits, hemoptysis, abdominal pain, melena, hematochezia, severe indigestion/heartburn, hematuria, incontinence, genital sores, muscle weakness, suspicious skin lesions, transient blindness, difficulty walking, depression, unusual weight change, abnormal bleeding, enlarged lymph nodes, angioedema, and breast masses.   Allergies   Penicillins and Azithromycin  Home Medications   Current Outpatient Rx  Name  Route  Sig  Dispense  Refill  . albuterol (VENTOLIN HFA) 108 (90 BASE) MCG/ACT inhaler   Inhalation   Inhale 2 puffs into the lungs every 6 (six) hours as needed. For shortness of breath/asthma/bronchitis          . ALPRAZolam (XANAX) 0.5 MG tablet   Oral   Take 0.5 mg by mouth at bedtime as needed. For sleep          . amLODipine (NORVASC) 5 MG tablet   Oral   Take 5 mg by mouth daily.         . cephALEXin (KEFLEX) 500 MG capsule   Oral   Take 1 capsule (500 mg total) by mouth 3 (three) times daily. For 10 days   30 capsule   0   . guaiFENesin-dextromethorphan (ROBITUSSIN DM) 100-10 MG/5ML syrup   Oral   Take 5 mLs by mouth at bedtime as needed for cough.          Marland Kitchen HYDROcodone-acetaminophen (NORCO/VICODIN) 5-325 MG per tablet   Oral   Take 1 tablet by mouth every 6 (six) hours as needed for moderate pain.         . hydrOXYzine (ATARAX/VISTARIL) 50 MG tablet   Oral   Take 50 mg by mouth every 4 (four) hours as needed for itching.         Marland Kitchen ibuprofen (ADVIL,MOTRIN) 800 MG tablet   Oral   Take  800 mg by mouth 3 (three) times daily. For pain         . lidocaine (XYLOCAINE) 2 % solution   Mouth/Throat   Use as directed 10 mLs in the mouth or throat every 4 (four) hours as needed for mouth pain. Swish and spit, do not swallow   100 mL   0   . pantoprazole (PROTONIX) 40 MG tablet   Oral   Take 40 mg by mouth 2 (two) times daily.           Marland Kitchen tiZANidine (ZANAFLEX) 2 MG tablet   Oral   Take 2 mg by mouth every 8 (eight) hours.         . topiramate (TOPAMAX) 25 MG tablet   Oral   Take 100 mg by mouth daily.          BP 152/103  Pulse 60  Temp(Src) 98.2 F (36.8 C) (Oral)  Resp 16  Ht 5\' 3"  (1.6 m)  Wt 140 lb (63.504 kg)  BMI 24.81 kg/m2  SpO2 100% Physical Exam  Nursing note and vitals reviewed. Constitutional: She appears well-developed and well-nourished.  No distress.  HENT:  Head: Normocephalic and atraumatic.  Nose: Nose normal.  Mouth/Throat: Oropharynx is clear and moist.  Eyes: Pupils are equal, round, and reactive to light.  Neck: Normal range of motion. Neck supple.  Cardiovascular: Normal rate and regular rhythm.   Pulmonary/Chest: Effort normal. She has no wheezes.  Abdominal: Soft.  Neurological: She is alert.  Skin: Skin is warm and dry. Rash noted. Rash is urticarial.  Diffuse excoriated urticaria without any pustules, purpura, papular or nodules.    ED Course  Procedures (including critical care time) Labs Review Labs Reviewed - No data to display Imaging Review Mm Digital Diagnostic Unilat L  12/08/2013   CLINICAL DATA:  Possible left breast mass at recent screening mammography.  EXAM: DIGITAL DIAGNOSTIC  LEFT MAMMOGRAM  COMPARISON:  Previous examinations, including the screening mammogram dated 10/09/2013.  ACR Breast Density Category b: There are scattered areas of fibroglandular density.  FINDINGS: Spot compression views of the left breast demonstrate normal appearing fibroglandular tissue at the location of the recently suspected mass.  IMPRESSION: No evidence of malignancy. The recently suspected left breast mass represented overlapping of normal fibroglandular tissue.  RECOMMENDATION: Bilateral screening mammogram in 10 months.  I have discussed the findings and recommendations with the patient. Results were also provided in writing at the conclusion of the visit. If applicable, a reminder letter will be sent to the patient regarding the next appointment.  BI-RADS CATEGORY  1: Negative.   Electronically Signed   By: Enrique Sack M.D.   On: 12/08/2013 15:09     EKG Interpretation None      MDM   Final diagnoses:  Urticaria    Patient given a steroid dose pack, Benadryl anti-itch cream and to prescription for Claritin. No systemic symptoms, or airway involvement. vital signs during this visit are nonacute.  48  y.o.Gearldine Bohlin's evaluation in the Emergency Department is complete. It has been determined that no acute conditions requiring further emergency intervention are present at this time. The patient/guardian have been advised of the diagnosis and plan. We have discussed signs and symptoms that warrant return to the ED, such as changes or worsening in symptoms.  Vital signs are stable at discharge. Filed Vitals:   12/09/13 1158  BP: 161/91  Pulse: 58  Temp:   Resp:     Patient/guardian has voiced  understanding and agreed to follow-up with the PCP or specialist.     Linus Mako, PA-C 12/09/13 1207

## 2013-12-09 NOTE — ED Notes (Signed)
Rash all over body x 3-4 days.  Taking OTC benadryl with no relief.

## 2013-12-09 NOTE — Discharge Instructions (Signed)

## 2013-12-09 NOTE — ED Notes (Signed)
Itching rash for 1 week, Says she used a peach scented soap prior to onset of rash , but has not used since then.  No resp distress.  Pt has taken benadryl.  No problem swallowing.  Urticarial appearing.

## 2013-12-10 NOTE — ED Provider Notes (Signed)
Medical screening examination/treatment/procedure(s) were performed by non-physician practitioner and as supervising physician I was immediately available for consultation/collaboration.   EKG Interpretation None        Maudry Diego, MD 12/10/13 1350

## 2013-12-16 DIAGNOSIS — J069 Acute upper respiratory infection, unspecified: Secondary | ICD-10-CM | POA: Diagnosis not present

## 2013-12-16 DIAGNOSIS — J209 Acute bronchitis, unspecified: Secondary | ICD-10-CM | POA: Diagnosis not present

## 2014-01-19 ENCOUNTER — Other Ambulatory Visit (HOSPITAL_COMMUNITY): Payer: Self-pay | Admitting: Family Medicine

## 2014-01-19 ENCOUNTER — Ambulatory Visit (HOSPITAL_COMMUNITY)
Admission: RE | Admit: 2014-01-19 | Discharge: 2014-01-19 | Disposition: A | Discharge status: Home / Self Care | Payer: Medicare Other | Source: Ambulatory Visit | Attending: Family Medicine | Admitting: Family Medicine

## 2014-01-19 DIAGNOSIS — M25511 Pain in right shoulder: Secondary | ICD-10-CM

## 2014-01-19 DIAGNOSIS — M259 Joint disorder, unspecified: Secondary | ICD-10-CM | POA: Diagnosis not present

## 2014-01-19 DIAGNOSIS — M25519 Pain in unspecified shoulder: Secondary | ICD-10-CM | POA: Diagnosis not present

## 2014-01-19 DIAGNOSIS — M19019 Primary osteoarthritis, unspecified shoulder: Secondary | ICD-10-CM | POA: Diagnosis not present

## 2014-02-02 ENCOUNTER — Ambulatory Visit: Payer: Medicare Other | Admitting: Orthopedic Surgery

## 2014-02-03 ENCOUNTER — Encounter (HOSPITAL_COMMUNITY): Payer: Self-pay | Admitting: Emergency Medicine

## 2014-02-03 ENCOUNTER — Emergency Department (HOSPITAL_COMMUNITY)
Admission: EM | Admit: 2014-02-03 | Discharge: 2014-02-03 | Disposition: A | Discharge status: Home / Self Care | Payer: Medicare Other | Attending: Emergency Medicine | Admitting: Emergency Medicine

## 2014-02-03 DIAGNOSIS — F17200 Nicotine dependence, unspecified, uncomplicated: Secondary | ICD-10-CM | POA: Insufficient documentation

## 2014-02-03 DIAGNOSIS — I10 Essential (primary) hypertension: Secondary | ICD-10-CM | POA: Insufficient documentation

## 2014-02-03 DIAGNOSIS — M25519 Pain in unspecified shoulder: Secondary | ICD-10-CM

## 2014-02-03 NOTE — ED Notes (Signed)
Shoulder pain x 1 month.  Unable to see ortho until next month.  C/o pain.  Denies new injury.

## 2014-02-03 NOTE — ED Notes (Signed)
Patient to nurse's station, asking about wait time to see DR. MD had signed up for patient, patient informed of this. Patient states she "doesn't have time to wait all day. I'll just come back tomorrow morning when I can be seen faster." Patient walked out of ER at that point. No distress upon departure.

## 2014-02-16 ENCOUNTER — Ambulatory Visit (INDEPENDENT_AMBULATORY_CARE_PROVIDER_SITE_OTHER): Payer: Medicare Other | Admitting: Orthopedic Surgery

## 2014-02-16 ENCOUNTER — Encounter: Payer: Self-pay | Admitting: Orthopedic Surgery

## 2014-02-16 VITALS — BP 110/76 | Ht 63.0 in | Wt 146.0 lb

## 2014-02-16 DIAGNOSIS — M719 Bursopathy, unspecified: Secondary | ICD-10-CM

## 2014-02-16 DIAGNOSIS — S4980XA Other specified injuries of shoulder and upper arm, unspecified arm, initial encounter: Secondary | ICD-10-CM

## 2014-02-16 DIAGNOSIS — M67919 Unspecified disorder of synovium and tendon, unspecified shoulder: Secondary | ICD-10-CM | POA: Diagnosis not present

## 2014-02-16 DIAGNOSIS — M7551 Bursitis of right shoulder: Secondary | ICD-10-CM | POA: Insufficient documentation

## 2014-02-16 DIAGNOSIS — S46909A Unspecified injury of unspecified muscle, fascia and tendon at shoulder and upper arm level, unspecified arm, initial encounter: Secondary | ICD-10-CM

## 2014-02-16 DIAGNOSIS — S4990XA Unspecified injury of shoulder and upper arm, unspecified arm, initial encounter: Secondary | ICD-10-CM | POA: Insufficient documentation

## 2014-02-16 MED ORDER — HYDROCODONE-ACETAMINOPHEN 10-325 MG PO TABS: 1.0000 | ORAL_TABLET | ORAL | Status: DC | PRN

## 2014-02-16 NOTE — Progress Notes (Signed)
Patient ID: Kirsten Walls, female   DOB: 10/09/1965, 48 y.o.   MRN: [id number]  Chief Complaint  Patient presents with  . Shoulder Pain    Right shoulder pain . Referred by DR. McInnis    New problem right shoulder pain. The patient was picking up a dresser about a month ago something in her shoulder pop and since that time she's been able to raise her arm. She has pain when cold air hits her shoulder. She has painful for elevation attempts. She has weakness. She has increased pain at night.  She does not have numbness or tingling she has not had fever or chills related to this. Past Surgical History  Procedure Laterality Date  . Cesarean section  x2  . Bunionectomy  both big toe  . Abdominal hysterectomy    . Tumor removal  left knee cap  . Knee surgery    . Foot surgery      BP 110/76  Ht 5\' 3"  (1.6 m)  Wt 146 lb (66.225 kg)  BMI 25.87 kg/m2 She is well-groomed and hygiene is normal. She is oriented to person place and time. Her mood is pleasant and her affect is normal  Her gait and station are normal  The right shoulder is tender anterior joint line and lateral deltoid. She has very poor active forward elevation abduction and external rotation and painful passive range of motion. She is stable in abduction external rotation. She has mild weakness in the rotator cuff musculature. Skin is warm dry and intact distal pulses are normal lymph nodes are negative and sensation is normal  I did lidocaine injection test on her as well as a therapeutic cortisone injection and afterwards her motion improved to 90 of abduction and 110 of forward elevation and the drop test was normal  Procedure inject subacromial space right shoulder Diagnosis rotator cuff syndrome right shoulder Medication Depo-Medrol 40 mg, 1 cc and lidocaine 1% 10 cc Verbal consent Timeout completed  The injection site was cleaned with alcohol and sprayed with ethyl chloride. From a posterior approach a 20-gauge  needle was injected in the subacromial space. The medication went in easily. There were no complications. The wound was covered with a sterile bandage. Appropriate precautions were given.

## 2014-02-16 NOTE — Patient Instructions (Signed)
You have received a steroid shot. 15% of patients experience increased pain at the injection site with in the next 24 hours. This is best treated with ice and tylenol extra strength 2 tabs every 8 hours. If you are still having pain please call the office.   Home exercise program

## 2014-03-03 DIAGNOSIS — M549 Dorsalgia, unspecified: Secondary | ICD-10-CM | POA: Diagnosis not present

## 2014-03-03 DIAGNOSIS — T23049A Burn of unspecified degree of unspecified multiple fingers (nail), including thumb, initial encounter: Secondary | ICD-10-CM | POA: Diagnosis not present

## 2014-03-11 ENCOUNTER — Emergency Department (HOSPITAL_COMMUNITY)
Admission: EM | Admit: 2014-03-11 | Discharge: 2014-03-11 | Disposition: A | Discharge status: Home / Self Care | Payer: Medicare Other | Attending: Emergency Medicine | Admitting: Emergency Medicine

## 2014-03-11 ENCOUNTER — Encounter (HOSPITAL_COMMUNITY): Payer: Self-pay | Admitting: Emergency Medicine

## 2014-03-11 DIAGNOSIS — Z79899 Other long term (current) drug therapy: Secondary | ICD-10-CM | POA: Insufficient documentation

## 2014-03-11 DIAGNOSIS — Z8709 Personal history of other diseases of the respiratory system: Secondary | ICD-10-CM | POA: Diagnosis not present

## 2014-03-11 DIAGNOSIS — L02519 Cutaneous abscess of unspecified hand: Secondary | ICD-10-CM | POA: Diagnosis not present

## 2014-03-11 DIAGNOSIS — M129 Arthropathy, unspecified: Secondary | ICD-10-CM | POA: Insufficient documentation

## 2014-03-11 DIAGNOSIS — L02419 Cutaneous abscess of limb, unspecified: Secondary | ICD-10-CM | POA: Diagnosis not present

## 2014-03-11 DIAGNOSIS — Z791 Long term (current) use of non-steroidal anti-inflammatories (NSAID): Secondary | ICD-10-CM | POA: Insufficient documentation

## 2014-03-11 DIAGNOSIS — I10 Essential (primary) hypertension: Secondary | ICD-10-CM | POA: Diagnosis not present

## 2014-03-11 DIAGNOSIS — Z880 Allergy status to penicillin: Secondary | ICD-10-CM | POA: Insufficient documentation

## 2014-03-11 DIAGNOSIS — F17200 Nicotine dependence, unspecified, uncomplicated: Secondary | ICD-10-CM | POA: Diagnosis not present

## 2014-03-11 DIAGNOSIS — L02415 Cutaneous abscess of right lower limb: Secondary | ICD-10-CM

## 2014-03-11 DIAGNOSIS — L03019 Cellulitis of unspecified finger: Principal | ICD-10-CM

## 2014-03-11 HISTORY — DX: Bronchitis, not specified as acute or chronic: J40

## 2014-03-11 HISTORY — DX: Unspecified osteoarthritis, unspecified site: M19.90

## 2014-03-11 MED ORDER — BACITRACIN-NEOMYCIN-POLYMYXIN 400-5-5000 EX OINT
TOPICAL_OINTMENT | Freq: Once | CUTANEOUS | Status: AC
  Administered 2014-03-11: 11:00:00 via TOPICAL
  Filled 2014-03-11: qty 1

## 2014-03-11 MED ORDER — SULFAMETHOXAZOLE-TMP DS 800-160 MG PO TABS
1.0000 | ORAL_TABLET | Freq: Once | ORAL | Status: AC
  Administered 2014-03-11: 1 via ORAL
  Filled 2014-03-11: qty 1

## 2014-03-11 MED ORDER — SULFAMETHOXAZOLE-TRIMETHOPRIM 800-160 MG PO TABS: 1.0000 | ORAL_TABLET | Freq: Two times a day (BID) | ORAL | Status: DC

## 2014-03-11 NOTE — ED Provider Notes (Signed)
CSN: [id number]     Arrival date & time 03/11/14  1016 History  This chart was scribed for Kirsten Christen, MD by Vernell Barrier, ED scribe. This patient was seen in room APFT24/APFT24 and the patient's care was started at 10:50 AM.    Chief Complaint  Patient presents with  . Burn    The history is provided by the patient. No language interpreter was used.   HPI Comments: Kirsten Walls is a 48 y.o. female w/ hx of HTN and asthma presents to the Emergency Department complaining of a gradually resolving tender burn to the right thumb w/ associated odor occuring June 6th. Some intermittent green discharge. States she burned her thumb on a hot pot.  PCP: Dr. Everette Rank   Past Medical History  Diagnosis Date  . Hypertension   . Arthritis   . Bronchitis    Past Surgical History  Procedure Laterality Date  . Cesarean section  x2  . Bunionectomy  both big toe  . Abdominal hysterectomy    . Tumor removal  left knee cap  . Knee surgery    . Foot surgery     Family History  Problem Relation Age of Onset  . Asthma     History  Substance Use Topics  . Smoking status: Current Every Day Smoker -- 1.00 packs/day for 30 years    Types: Cigarettes  . Smokeless tobacco: Never Used  . Alcohol Use: No   OB History   Grav Para Term Preterm Abortions TAB SAB Ect Mult Living   4 4 4       4      Review of Systems  Skin: Positive for wound.   A complete 10 system review of systems was obtained and all systems are negative except as noted in the HPI and PMH.   Allergies  Penicillins and Azithromycin  Home Medications   Prior to Admission medications   Medication Sig Start Date End Date Taking? Authorizing Provider  albuterol (VENTOLIN HFA) 108 (90 BASE) MCG/ACT inhaler Inhale 2 puffs into the lungs every 6 (six) hours as needed. For shortness of breath/asthma/bronchitis    Yes Historical Provider, MD  ALPRAZolam Duanne Moron) 0.5 MG tablet Take 0.5 mg by mouth at bedtime as needed. For sleep     Yes Historical Provider, MD  amLODipine (NORVASC) 5 MG tablet Take 5 mg by mouth daily.   Yes Historical Provider, MD  HYDROcodone-acetaminophen (NORCO) 10-325 MG per tablet Take 1 tablet by mouth every 4 (four) hours as needed. 02/16/14  Yes Carole Civil, MD  hydrOXYzine (ATARAX/VISTARIL) 50 MG tablet Take 50 mg by mouth every 4 (four) hours as needed for itching.   Yes Historical Provider, MD  ibuprofen (ADVIL,MOTRIN) 800 MG tablet Take 800 mg by mouth 3 (three) times daily. For pain   Yes Historical Provider, MD  pantoprazole (PROTONIX) 40 MG tablet Take 40 mg by mouth 2 (two) times daily.     Yes Historical Provider, MD  tiZANidine (ZANAFLEX) 2 MG tablet Take 2 mg by mouth every 8 (eight) hours.   Yes Historical Provider, MD  topiramate (TOPAMAX) 25 MG tablet Take 100 mg by mouth daily.   Yes Historical Provider, MD  sulfamethoxazole-trimethoprim (SEPTRA DS) 800-160 MG per tablet Take 1 tablet by mouth 2 (two) times daily. 03/11/14   Kirsten Christen, MD   Triage vitals: BP 103/61  Pulse 60  Temp(Src) 98.3 F (36.8 C) (Oral)  Resp 16  Ht 5\' 3"  (1.6 m)  Wt 140 lb (  63.504 kg)  BMI 24.81 kg/m2  SpO2 100%  Physical Exam  Nursing note and vitals reviewed. Constitutional: She is oriented to person, place, and time. She appears well-developed and well-nourished.  HENT:  Head: Normocephalic and atraumatic.  Eyes: Conjunctivae are normal.  Neck: Normal range of motion. Neck supple.  Musculoskeletal: Normal range of motion.  Neurological: She is alert and oriented to person, place, and time.  Skin: Skin is warm and dry.  1.0x0.7 cm pustule on the palmar aspect of the distal phalanx of the right thumb  Psychiatric: She has a normal mood and affect. Her behavior is normal.    ED Course  Procedures (including critical care time) DIAGNOSTIC STUDIES: Oxygen Saturation is 100% on room air, normal by my interpretation.    COORDINATION OF CARE: At 10:53 AM: Discussed treatment plan with  patient which includes draining of the area and prescription for 3 day antibiotics. Pt advised to soak the area with warm saltwater at home for continued treatment. Patient agrees.    INCISION AND DRAINAGE PROCEDURE NOTE: Patient identification was confirmed and verbal consent was obtained. This procedure was performed by Kirsten Christen, MD at 10:55 AM. Site: palmar aspect of distal phalanx of right thumb Sterile procedures observed Needle size: 18 G Drainage: moderate amount of purulent drainage Complexity: Simple Packing used: none Needle puncture unroofed the area, wound drained and explored loculations, rinsed with soap and water, covered with a bandaid. Pt tolerated procedure well without complications. Instructions for care discussed verbally and pt provided with additional written instructions for homecare and f/u.  Labs Review Labs Reviewed - No data to display  Imaging Review No results found.   EKG Interpretation None      MDM   Final diagnoses:  Abscess of right thigh   Patient has a pustule on the palmar aspect of the distal phalanx of the right thumb. Incision and drainage of same. Rx Septra DS. Soak in hot salt water I personally performed the services described in this documentation, which was scribed in my presence. The recorded information has been reviewed and is accurate.     Kirsten Christen, MD 03/12/14 [phone]

## 2014-03-11 NOTE — ED Notes (Signed)
MD at bedside. 

## 2014-03-11 NOTE — Discharge Instructions (Signed)
Abscess An abscess (boil or furuncle) is an infected area on or under the skin. This area is filled with yellowish-white fluid (pus) and other material (debris). HOME CARE   Only take medicines as told by your doctor.  If you were given antibiotic medicine, take it as directed. Finish the medicine even if you start to feel better.  If gauze is used, follow your doctor's directions for changing the gauze.  To avoid spreading the infection:  Keep your abscess covered with a bandage.  Wash your hands well.  Do not share personal care items, towels, or whirlpools with others.  Avoid skin contact with others.  Keep your skin and clothes clean around the abscess.  Keep all doctor visits as told. GET HELP RIGHT AWAY IF:   You have more pain, puffiness (swelling), or redness in the wound site.  You have more fluid or blood coming from the wound site.  You have muscle aches, chills, or you feel sick.  You have a fever. MAKE SURE YOU:   Understand these instructions.  Will watch your condition.  Will get help right away if you are not doing well or get worse. Document Released: 02/21/2008 Document Revised: 03/05/2012 Document Reviewed: 11/17/2011 [facility] Patient Information 2015 [place], [place]. This information is not intended to replace advice given to you by your health care provider. Make sure you discuss any questions you have with your health care provider. Soak in hot salt water. Antibiotic twice a day. Cover with simple Band-Aid.

## 2014-03-11 NOTE — ED Notes (Signed)
Pt with burn to right thumb pad of finger tip, states she popped blister with a pin and now states with foul odor and green drainage coming from it

## 2014-03-11 NOTE — ED Notes (Signed)
Burn to right thumb x 2 1/2 wks ago.  Reports not getting better.

## 2014-03-16 ENCOUNTER — Encounter (HOSPITAL_COMMUNITY): Payer: Self-pay | Admitting: Emergency Medicine

## 2014-03-16 ENCOUNTER — Emergency Department (HOSPITAL_COMMUNITY)
Admission: EM | Admit: 2014-03-16 | Discharge: 2014-03-16 | Disposition: A | Discharge status: Home / Self Care | Payer: Medicare Other | Attending: Emergency Medicine | Admitting: Emergency Medicine

## 2014-03-16 DIAGNOSIS — Z880 Allergy status to penicillin: Secondary | ICD-10-CM | POA: Insufficient documentation

## 2014-03-16 DIAGNOSIS — Z791 Long term (current) use of non-steroidal anti-inflammatories (NSAID): Secondary | ICD-10-CM | POA: Insufficient documentation

## 2014-03-16 DIAGNOSIS — I10 Essential (primary) hypertension: Secondary | ICD-10-CM | POA: Insufficient documentation

## 2014-03-16 DIAGNOSIS — M129 Arthropathy, unspecified: Secondary | ICD-10-CM | POA: Insufficient documentation

## 2014-03-16 DIAGNOSIS — Z8709 Personal history of other diseases of the respiratory system: Secondary | ICD-10-CM | POA: Diagnosis not present

## 2014-03-16 DIAGNOSIS — R209 Unspecified disturbances of skin sensation: Secondary | ICD-10-CM | POA: Diagnosis not present

## 2014-03-16 DIAGNOSIS — L509 Urticaria, unspecified: Secondary | ICD-10-CM

## 2014-03-16 DIAGNOSIS — F17200 Nicotine dependence, unspecified, uncomplicated: Secondary | ICD-10-CM | POA: Diagnosis not present

## 2014-03-16 DIAGNOSIS — Z792 Long term (current) use of antibiotics: Secondary | ICD-10-CM | POA: Diagnosis not present

## 2014-03-16 DIAGNOSIS — R21 Rash and other nonspecific skin eruption: Secondary | ICD-10-CM | POA: Diagnosis present

## 2014-03-16 DIAGNOSIS — Z79899 Other long term (current) drug therapy: Secondary | ICD-10-CM | POA: Diagnosis not present

## 2014-03-16 MED ORDER — DEXAMETHASONE SODIUM PHOSPHATE 4 MG/ML IJ SOLN
8.0000 mg | Freq: Once | INTRAMUSCULAR | Status: AC
Start: 2014-03-16 — End: 2014-03-16
  Administered 2014-03-16: 8 mg via INTRAMUSCULAR
  Filled 2014-03-16: qty 2

## 2014-03-16 MED ORDER — DIPHENHYDRAMINE HCL 25 MG PO CAPS
25.0000 mg | ORAL_CAPSULE | Freq: Once | ORAL | Status: AC
  Administered 2014-03-16: 25 mg via ORAL
  Filled 2014-03-16: qty 1

## 2014-03-16 NOTE — ED Notes (Signed)
PT was started on ABT for thumb infection about a week ago and developed hives/rash. PT stopped ABT yesterday and had only one dose left to take. PT denies any SOB or facial swelling.

## 2014-03-16 NOTE — Discharge Instructions (Signed)
Hives  Hives are itchy, red, puffy (swollen) areas of the skin. Hives can change in size and location on your body. Hives can come and go for hours, days, or weeks. Hives do not spread from person to person (noncontagious). Scratching, exercise, and stress can make your hives worse.  HOME CARE  · Avoid things that cause your hives (triggers).  · Take antihistamine medicines as told by your doctor. Do not drive while taking an antihistamine.  · Take any other medicines for itching as told by your doctor.  · Wear loose-fitting clothing.  · Keep all doctor visits as told.  GET HELP RIGHT AWAY IF:   · You have a fever.  · Your tongue or lips are puffy.  · You have trouble breathing or swallowing.  · You feel tightness in the throat or chest.  · You have belly (abdominal) pain.  · You have lasting or severe itching that is not helped by medicine.  · You have painful or puffy joints.  These problems may be the first sign of a life-threatening allergic reaction. Call your local emergency services (911 in [place]  MAKE SURE YOU:   · Understand these instructions.  · Will watch your condition.  · Will get help right away if you are not doing well or get worse.  Document Released: 06/13/2008 Document Revised: 03/05/2012 Document Reviewed: 11/28/2011  [employer]® Patient Information ©2015 [employer]. This information is not intended to replace advice given to you by your health care provider. Make sure you discuss any questions you have with your health care provider.

## 2014-03-16 NOTE — ED Notes (Signed)
Started abx last week - two days after reporting hives.  Stopped abx yesterday.

## 2014-03-16 NOTE — ED Provider Notes (Signed)
SN: [id number]     Arrival date & time 03/16/14  09138/ History   First MD Initiated Contact with Patient 03/16/14 1029  This chart was scribed for non-physician practitioner Lily Kocher, PA-C working with Fredia Sorrow, MD by Anastasia Pall, ED scribe. This patient was seen in room APA17/APA17 and the patient's care was started at 11:24 AM.    Chief Complaint  Patient presents with  . Rash     (Consider location/radiation/quality/duration/timing/severity/associated sxs/prior Treatment) Patient is a 48 y.o. female presenting with rash. The history is provided by the patient. No language interpreter was used.  Rash Location:  Leg, head/neck and shoulder/arm Head/neck rash location:  L neck and R neck Shoulder/arm rash location:  R shoulder, R arm and L arm Leg rash location:  L leg and R leg Severity:  Moderate Onset quality:  Sudden Duration:  3 days Timing:  Constant Progression:  Unchanged Chronicity:  New Context comment:  Septra use Relieved by:  Nothing Ineffective treatments: HydroCortisone. Associated symptoms: no fever and no joint pain    HPI Comments: Kirsten Walls is a 48 y.o. female who presents to the Emergency Department complaining of a hive-like rash over her bilateral arms, inner bilateral thighs, right shoulder, and right and left neck, onset 3 days ago after taking antibiotics, Septra DS, for a recent thumb burn. She states she has tried a 2.5 Hydrocortisone cream for her rash, without relief. She states she has stopped taking her Septra antibiotic. She denies tongue swelling with her reaction, and any other associated symptoms. She reports allergies to Penicillin related medications.    Past Medical History  Diagnosis Date  . Hypertension   . Arthritis   . Bronchitis    Past Surgical History  Procedure Laterality Date  . Cesarean section  x2  . Bunionectomy  both big toe  . Abdominal hysterectomy    . Tumor removal  left knee cap  . Knee surgery     . Foot surgery     Family History  Problem Relation Age of Onset  . Asthma     History  Substance Use Topics  . Smoking status: Current Every Day Smoker -- 1.00 packs/day for 30 years    Types: Cigarettes  . Smokeless tobacco: Never Used  . Alcohol Use: No   OB History   Grav Para Term Preterm Abortions TAB SAB Ect Mult Living   4 4 4       4      Review of Systems  Constitutional: Negative for fever.  Musculoskeletal: Negative for arthralgias.  Skin: Positive for rash.  All other systems reviewed and are negative.   Allergies  Penicillins; Azithromycin; and Sulfamethoxazole-trimethoprim  Home Medications   Prior to Admission medications   Medication Sig Start Date End Date Taking? Authorizing Provider  albuterol (VENTOLIN HFA) 108 (90 BASE) MCG/ACT inhaler Inhale 2 puffs into the lungs every 6 (six) hours as needed. For shortness of breath/asthma/bronchitis    Yes Historical Provider, MD  ALPRAZolam Duanne Moron) 0.5 MG tablet Take 0.5 mg by mouth at bedtime as needed. For sleep    Yes Historical Provider, MD  amLODipine (NORVASC) 5 MG tablet Take 5 mg by mouth daily.   Yes Historical Provider, MD  HYDROcodone-acetaminophen (NORCO) 10-325 MG per tablet Take 1 tablet by mouth every 4 (four) hours as needed. 02/16/14  Yes Carole Civil, MD  hydrOXYzine (ATARAX/VISTARIL) 50 MG tablet Take 50 mg by mouth every 4 (four) hours as needed for itching.  Yes Historical Provider, MD  ibuprofen (ADVIL,MOTRIN) 800 MG tablet Take 800 mg by mouth 3 (three) times daily. For pain   Yes Historical Provider, MD  pantoprazole (PROTONIX) 40 MG tablet Take 40 mg by mouth 2 (two) times daily.     Yes Historical Provider, MD  sulfamethoxazole-trimethoprim (SEPTRA DS) 800-160 MG per tablet Take 1 tablet by mouth 2 (two) times daily. 03/11/14  Yes [name], MD  tiZANidine (ZANAFLEX) 2 MG tablet Take 2 mg by mouth every 8 (eight) hours.   Yes Historical Provider, MD  topiramate (TOPAMAX) 25 MG tablet  Take 100 mg by mouth daily.   Yes Historical Provider, MD   BP 109/78  Pulse 52  Temp(Src) 97.5 F (36.4 C) (Oral)  Resp 18  SpO2 100% Physical Exam  Nursing note and vitals reviewed. Constitutional: She is oriented to person, place, and time. She appears well-developed and well-nourished. No distress.  Speech is clear and understandable.  HENT:  Head: Normocephalic and atraumatic.  Mouth/Throat: Uvula is midline, oropharynx is clear and moist and mucous membranes are normal. No oropharyngeal exudate.  Eyes: Conjunctivae and EOM are normal.  Neck: Neck supple. No tracheal deviation present.  Cardiovascular: Normal rate, regular rhythm, normal heart sounds and intact distal pulses.   No murmur heard. Pulmonary/Chest: Effort normal and breath sounds normal. No respiratory distress. She has no wheezes. She has no rales.  Musculoskeletal: Normal range of motion.  Right thumb healing burn granulation tissue is present no red streaking. Full ROM and no effusion of DIP.  Neurological: She is alert and oriented to person, place, and time. No cranial nerve deficit. She exhibits normal muscle tone. Coordination normal.  No facial asymmetry.  Skin: Skin is warm and dry. Rash noted. There is erythema.  Red macular rash in clusters on the right anterior thigh as well as left anterior thigh. Right and left antecubital, right shoulder, and bilateral neck.   Psychiatric: She has a normal mood and affect. Her behavior is normal.    ED Course  Procedures (including critical care time)  DIAGNOSTIC STUDIES: Oxygen Saturation is 100% on room air, normal by my interpretation.    COORDINATION OF CARE: 11:29 AM- Advised pt of suspicion she is allergic to Sulfa related antibiotics. Discussed treatment plan which includes steroid treatment with pt at bedside and pt agreed to plan.   Medications - No data to display  Labs Review Labs Reviewed - No data to display  Imaging Review No results  found.   EKG Interpretation None      MDM Patient has a rash in resolving hives of multiple areas. The airway is patent. There is no facial swelling. Lungs are clear.  I have asked the patient to stop the Septra. The patient was given an injection of Decadron in the emergency department. She is asked to use Benadryl for itching. She is advised to return to the emergency department immediately if hives or rash return, or there is swelling or difficulty with breathing or any deterioration in her general condition.    Final diagnoses:  None    **I have reviewed nursing notes, vital signs, and all appropriate lab and imaging results for this patient.*  **I personally performed the services described in this documentation, which was scribed in my presence. The recorded information has been reviewed and is accurate.Lenox Ahr, PA-C 03/16/14 1846

## 2014-03-17 DIAGNOSIS — T23049A Burn of unspecified degree of unspecified multiple fingers (nail), including thumb, initial encounter: Secondary | ICD-10-CM | POA: Diagnosis not present

## 2014-03-17 DIAGNOSIS — L509 Urticaria, unspecified: Secondary | ICD-10-CM | POA: Diagnosis not present

## 2014-03-17 NOTE — ED Provider Notes (Signed)
Medical screening examination/treatment/procedure(s) were performed by non-physician practitioner and as supervising physician I was immediately available for consultation/collaboration.   EKG Interpretation None        Fredia Sorrow, MD 03/17/14 [phone]

## 2014-04-07 ENCOUNTER — Encounter: Payer: Self-pay | Admitting: Orthopedic Surgery

## 2014-04-07 ENCOUNTER — Ambulatory Visit (INDEPENDENT_AMBULATORY_CARE_PROVIDER_SITE_OTHER): Payer: Medicare Other | Admitting: Orthopedic Surgery

## 2014-04-07 VITALS — BP 115/74 | Ht 63.0 in | Wt 140.0 lb

## 2014-04-07 DIAGNOSIS — S43429A Sprain of unspecified rotator cuff capsule, initial encounter: Secondary | ICD-10-CM | POA: Diagnosis not present

## 2014-04-07 DIAGNOSIS — M75101 Unspecified rotator cuff tear or rupture of right shoulder, not specified as traumatic: Secondary | ICD-10-CM

## 2014-04-07 MED ORDER — HYDROCODONE-ACETAMINOPHEN 10-325 MG PO TABS: 1.0000 | ORAL_TABLET | ORAL | Status: DC | PRN

## 2014-04-07 NOTE — Progress Notes (Signed)
Patient ID: Kirsten Walls, female   DOB: 02-28-1966, 48 y.o.   MRN: [id number] Chief Complaint  Patient presents with  . Follow-up    6 week recheck on right shoulder after injection and HEP.   BP 115/74  Ht 5\' 3"  (1.6 m)  Wt 140 lb (63.504 kg)  BMI 24.81 kg/m2  This is a 48 year old female who injured her right shoulder in May of 2015 when she picked up her dresser felt a pop and then could not raise her arm. My initial treatment was physical therapy, subacromial injection. Oral pain medication. She previously tried over-the-counter Advil and Aleve on her own.  Review of systems she denies neck pain musculoskeletal, denies numbness and tingling in the right hand neurologic.  Vitals are stable appearance is normal. She is oriented to person place and time. Mood is pleasant. Ambulation is normal and noncontributory. Is normal radial pulse normal sensation in the right hand. Lymph nodes axilla cervical spine normal. She is tender around the shoulder acromion anterior joint line with painful range of motion and limited active range of motion. Shoulder stability normal in abduction external rotation. She has weakness of her supraspinatus tendon unable to raise against gravity. She has normal external rotation strength and motion.  Diagnosis rotator cuff tear  Recommend right shoulder MRI. A tear is suspected and will need surgical repair

## 2014-04-07 NOTE — Patient Instructions (Signed)
We will schedule MRI and call you with appointment 

## 2014-04-13 ENCOUNTER — Ambulatory Visit (HOSPITAL_COMMUNITY)
Admission: RE | Admit: 2014-04-13 | Discharge: 2014-04-13 | Disposition: A | Discharge status: Home / Self Care | Payer: Medicare Other | Source: Ambulatory Visit | Attending: Orthopedic Surgery | Admitting: Orthopedic Surgery

## 2014-04-13 DIAGNOSIS — X58XXXA Exposure to other specified factors, initial encounter: Secondary | ICD-10-CM | POA: Insufficient documentation

## 2014-04-13 DIAGNOSIS — M75101 Unspecified rotator cuff tear or rupture of right shoulder, not specified as traumatic: Secondary | ICD-10-CM

## 2014-04-13 DIAGNOSIS — S43429A Sprain of unspecified rotator cuff capsule, initial encounter: Secondary | ICD-10-CM | POA: Diagnosis not present

## 2014-04-13 DIAGNOSIS — S46919A Strain of unspecified muscle, fascia and tendon at shoulder and upper arm level, unspecified arm, initial encounter: Secondary | ICD-10-CM | POA: Diagnosis not present

## 2014-04-13 DIAGNOSIS — S46819A Strain of other muscles, fascia and tendons at shoulder and upper arm level, unspecified arm, initial encounter: Secondary | ICD-10-CM | POA: Diagnosis not present

## 2014-04-27 ENCOUNTER — Encounter: Payer: Self-pay | Admitting: Orthopedic Surgery

## 2014-04-27 ENCOUNTER — Ambulatory Visit (INDEPENDENT_AMBULATORY_CARE_PROVIDER_SITE_OTHER): Payer: Medicare Other | Admitting: Orthopedic Surgery

## 2014-04-27 VITALS — BP 110/73 | Ht 63.0 in | Wt 140.0 lb

## 2014-04-27 DIAGNOSIS — M19019 Primary osteoarthritis, unspecified shoulder: Secondary | ICD-10-CM | POA: Diagnosis not present

## 2014-04-27 MED ORDER — HYDROCODONE-ACETAMINOPHEN 10-325 MG PO TABS: 1.0000 | ORAL_TABLET | ORAL | Status: DC | PRN

## 2014-04-27 NOTE — Patient Instructions (Signed)
We will refer you to a shoulder specialist,  Dr Mardelle Matte

## 2014-04-27 NOTE — Progress Notes (Signed)
Followup visit   Chief Complaint  Patient presents with  . Follow-up    MRI results of right shoulder.   BP 110/73  Ht 5\' 3"  (1.6 m)  Wt 140 lb (63.504 kg)  BMI 24.81 kg/m2  Continued pain right shoulder with loss of motion and pain with cold weather and cold air when it hits the shoulder. She has weakness as well.  Review of systems no new findings since her last office visit  Her MRI shows glenohumeral arthritis fairly severe with torn labrum. She is 48 years old. Her plain films do not show the extent of the arthritis in the shoulder so I don't think she would be a candidate for shoulder replacement she may need arthroscopic debridement like to have her see a substance for the shoulder to get a second opinion and then of arthroscopic surgery was warned that I would go ahead and do that for her  She can refill her medications at this time  Meds ordered this encounter  Medications  . HYDROcodone-acetaminophen (NORCO) 10-325 MG per tablet    Sig: Take 1 tablet by mouth every 4 (four) hours as needed.    Dispense:  60 tablet    Refill:  0

## 2014-04-29 ENCOUNTER — Telehealth: Payer: Self-pay | Admitting: *Deleted

## 2014-04-29 NOTE — Telephone Encounter (Signed)
SENT OFFICE NOTE TO DR Everette Rank WITH NOTE ADVISING OF DR HARRISON'S RECOMMENDATION TO REFER PATIENT TO SHOULDER SPECIALIST, DR LANDAU. DUE TO PATIENT'S INSURANCE BEING [place] ACCESS MEDICAID, REFERRAL WILL HAVE TO COME FROM PCP.

## 2014-05-05 DIAGNOSIS — Z79899 Other long term (current) drug therapy: Secondary | ICD-10-CM | POA: Diagnosis not present

## 2014-05-05 DIAGNOSIS — M5137 Other intervertebral disc degeneration, lumbosacral region: Secondary | ICD-10-CM | POA: Diagnosis not present

## 2014-05-05 DIAGNOSIS — L259 Unspecified contact dermatitis, unspecified cause: Secondary | ICD-10-CM | POA: Diagnosis not present

## 2014-05-07 DIAGNOSIS — R569 Unspecified convulsions: Secondary | ICD-10-CM | POA: Diagnosis not present

## 2014-05-07 DIAGNOSIS — R51 Headache: Secondary | ICD-10-CM | POA: Diagnosis not present

## 2014-05-07 DIAGNOSIS — R040 Epistaxis: Secondary | ICD-10-CM | POA: Diagnosis not present

## 2014-05-07 DIAGNOSIS — I10 Essential (primary) hypertension: Secondary | ICD-10-CM | POA: Diagnosis not present

## 2014-05-18 ENCOUNTER — Other Ambulatory Visit: Payer: Self-pay | Admitting: *Deleted

## 2014-05-18 ENCOUNTER — Telehealth: Payer: Self-pay | Admitting: Orthopedic Surgery

## 2014-05-18 MED ORDER — HYDROCODONE-ACETAMINOPHEN 10-325 MG PO TABS: 1.0000 | ORAL_TABLET | ORAL | Status: DC | PRN

## 2014-05-18 NOTE — Telephone Encounter (Signed)
Kirsten Walls wants a prescription for Hydrocodone 10/325

## 2014-05-18 NOTE — Telephone Encounter (Signed)
Prescription available, left VM

## 2014-06-03 ENCOUNTER — Telehealth: Payer: Self-pay | Admitting: Orthopedic Surgery

## 2014-06-03 ENCOUNTER — Telehealth: Payer: Self-pay | Admitting: *Deleted

## 2014-06-03 ENCOUNTER — Other Ambulatory Visit: Payer: Self-pay | Admitting: *Deleted

## 2014-06-03 MED ORDER — HYDROCODONE-ACETAMINOPHEN 10-325 MG PO TABS: 1.0000 | ORAL_TABLET | ORAL | Status: DC | PRN

## 2014-06-03 NOTE — Telephone Encounter (Signed)
PATIENT HAS APPOINTMENT WITH DR Mardelle Matte 06/11/14 @ 2:15

## 2014-06-04 NOTE — Telephone Encounter (Signed)
Prescription available, patient aware  

## 2014-06-22 DIAGNOSIS — M19011 Primary osteoarthritis, right shoulder: Secondary | ICD-10-CM | POA: Diagnosis not present

## 2014-06-23 ENCOUNTER — Telehealth: Payer: Self-pay | Admitting: Orthopedic Surgery

## 2014-06-23 ENCOUNTER — Other Ambulatory Visit: Payer: Self-pay | Admitting: *Deleted

## 2014-06-23 MED ORDER — HYDROCODONE-ACETAMINOPHEN 10-325 MG PO TABS: 1.0000 | ORAL_TABLET | ORAL | Status: DC | PRN

## 2014-06-23 NOTE — Telephone Encounter (Signed)
Patient is calling asking for a refill on pain med due to shoulder pain HYDROcodone-acetaminophen (NORCO) 10-325 MG per tablet please advise?

## 2014-06-23 NOTE — Telephone Encounter (Signed)
Patient picked up Rx

## 2014-07-07 ENCOUNTER — Telehealth: Payer: Self-pay | Admitting: Orthopedic Surgery

## 2014-07-07 ENCOUNTER — Other Ambulatory Visit: Payer: Self-pay | Admitting: *Deleted

## 2014-07-07 MED ORDER — HYDROCODONE-ACETAMINOPHEN 10-325 MG PO TABS: 1.0000 | ORAL_TABLET | ORAL | Status: DC | PRN

## 2014-07-07 NOTE — Telephone Encounter (Signed)
Patient is calling asking for a refill on pain medication HYDROcodone-acetaminophen (NORCO) 10-325 MG per tablet please advise?

## 2014-07-07 NOTE — Telephone Encounter (Signed)
Prescription available, patient aware  

## 2014-07-20 ENCOUNTER — Encounter: Payer: Self-pay | Admitting: Orthopedic Surgery

## 2014-07-21 ENCOUNTER — Telehealth: Payer: Self-pay | Admitting: Orthopedic Surgery

## 2014-07-21 ENCOUNTER — Other Ambulatory Visit: Payer: Self-pay | Admitting: *Deleted

## 2014-07-21 MED ORDER — HYDROCODONE-ACETAMINOPHEN 10-325 MG PO TABS: 1.0000 | ORAL_TABLET | ORAL | Status: DC | PRN

## 2014-07-21 NOTE — Telephone Encounter (Signed)
Prescription available, patient aware  

## 2014-07-21 NOTE — Telephone Encounter (Signed)
Patient is calling asking for a refill on pain medicineHYDROcodone-acetaminophen (NORCO) 10-325 MG per tablet [[id number]] please advise?

## 2014-07-22 NOTE — Telephone Encounter (Signed)
Patient picked up Rx

## 2014-07-30 DIAGNOSIS — M7541 Impingement syndrome of right shoulder: Secondary | ICD-10-CM | POA: Diagnosis not present

## 2014-07-30 DIAGNOSIS — M75101 Unspecified rotator cuff tear or rupture of right shoulder, not specified as traumatic: Secondary | ICD-10-CM | POA: Diagnosis not present

## 2014-07-30 DIAGNOSIS — G8918 Other acute postprocedural pain: Secondary | ICD-10-CM | POA: Diagnosis not present

## 2014-07-30 DIAGNOSIS — M19011 Primary osteoarthritis, right shoulder: Secondary | ICD-10-CM | POA: Diagnosis not present

## 2014-07-30 DIAGNOSIS — M7551 Bursitis of right shoulder: Secondary | ICD-10-CM | POA: Diagnosis not present

## 2014-07-30 DIAGNOSIS — M24111 Other articular cartilage disorders, right shoulder: Secondary | ICD-10-CM | POA: Diagnosis not present

## 2014-08-10 ENCOUNTER — Telehealth: Payer: Self-pay | Admitting: Orthopedic Surgery

## 2014-08-10 NOTE — Telephone Encounter (Signed)
Patient aware.

## 2014-08-10 NOTE — Telephone Encounter (Signed)
Patient had called on Friday morning, 08/07/14, at which time our office had no clinical staff.  She requests refill on pain medication: HYDROcodone-acetaminophen ([place]) 10-325 MG per tablet [[id number]]   - In reviewing patient's chart, she had been referred to [facility], and had been scheduled with, and had seen Dr. Marchia Bond on 06/22/14. We have requested report and recent notes, as we had not yet received correspondence.  Patient also had surgery by Dr. Mardelle Matte on 07/30/14.  Their office also stated that she had a post operative appointment today, 08/10/14, and had not shown for the visit.  Reports have been placed in Dr's box for review.  Please advise.

## 2014-08-10 NOTE — Telephone Encounter (Signed)
Routing to Dr Harrison 

## 2014-08-10 NOTE — Telephone Encounter (Signed)
No meds then call their office

## 2014-08-24 DIAGNOSIS — M7541 Impingement syndrome of right shoulder: Secondary | ICD-10-CM | POA: Diagnosis not present

## 2014-09-02 ENCOUNTER — Encounter (HOSPITAL_COMMUNITY): Payer: Self-pay

## 2014-09-02 ENCOUNTER — Ambulatory Visit (HOSPITAL_COMMUNITY)
Admission: RE | Admit: 2014-09-02 | Discharge: 2014-09-02 | Disposition: A | Discharge status: Home / Self Care | Payer: Medicare Other | Source: Ambulatory Visit | Attending: Orthopedic Surgery | Admitting: Orthopedic Surgery

## 2014-09-02 DIAGNOSIS — M7581 Other shoulder lesions, right shoulder: Secondary | ICD-10-CM | POA: Diagnosis not present

## 2014-09-02 DIAGNOSIS — R29898 Other symptoms and signs involving the musculoskeletal system: Secondary | ICD-10-CM | POA: Diagnosis not present

## 2014-09-02 DIAGNOSIS — M25511 Pain in right shoulder: Secondary | ICD-10-CM | POA: Diagnosis not present

## 2014-09-02 DIAGNOSIS — M25611 Stiffness of right shoulder, not elsewhere classified: Secondary | ICD-10-CM

## 2014-09-02 NOTE — Patient Instructions (Signed)
.  Pendulum Circular   Bend forward 90 at waist, leaning on table for support. Rock body in a circular pattern to move arm clockwise and then counterclockwise, each way for 1-2 minutes. Do 2-3 sessions per day.  Copyright  VHI. All rights reserved.    Pendulum Forward/Back   Bend forward 90 at waist, using table for support. Rock body forward and back to swing arm. Continue for 1-2 minutes, Repeat 2-3 times per day.   Copyright  VHI. All rights reserved.   Pendulum Side to Side   Bend forward 90 at waist, leaning on table for support. Rock body from side to side and let arm swing freely. Continue for 1-2 minutes, Repeat 2-3 times per day.  Copyright  VHI. All rights reserved.    SHOULDER: Flexion On Table   Place hands on table, elbows straight. Lean forward to stretch arms in front of you.  Smoothly glide forward and back for 1-2 minutes, 2-3 times per day.  Abduction (Passive)   With arm out to side, lean to your right side to stretch arm beside of you, and lean tall again.  Repeat for 1-2 minutes, smoothly, 2-3 times per day.  Copyright  VHI. All rights reserved.     Internal Rotation (Assistive)   Seated with elbow bent at right angle and held against side, slide arm on table surface in an inward arc. Repeat for 1-2 minutes, 2-3 times per day. Activity: Use this motion to brush crumbs off the table.  Copyright  VHI. All rights reserved.   Kirsten Graff Nekayla Heider, MS, OTR/L [name]

## 2014-09-02 NOTE — Therapy (Signed)
[facility] [address] [place], [place], [address] Phone: [phone]   Fax:  [phone]  Occupational Therapy Evaluation  Patient Details  Name: Kirsten Walls MRN: [id number] Date of Birth: 1965-12-28  Encounter Date: 09/02/2014      OT End of Session - 09/02/14 1204    Visit Number 1   Number of Visits 16   Date for OT Re-Evaluation 09/30/14   Authorization Type Medicare and Medicaid   Authorization Time Period Before 10th visit   Authorization - Visit Number 1   Authorization - Number of Visits 10   OT Start Time [phone]   OT Stop Time 1040   OT Time Calculation (min) 49 min   Activity Tolerance Patient tolerated treatment well   Behavior During Therapy [facility] for tasks assessed/performed      Past Medical History  Diagnosis Date  . Hypertension   . Arthritis   . Bronchitis     Past Surgical History  Procedure Laterality Date  . Cesarean section  x2  . Bunionectomy  both big toe  . Abdominal hysterectomy    . Tumor removal  left knee cap  . Knee surgery    . Foot surgery      There were no vitals taken for this visit.  Visit Diagnosis:  Pain in joint, shoulder region, right  Decreased range of motion of shoulder, right  Shoulder weakness      Subjective Assessment - 09/02/14 1050    Symptoms "he took out the arthritis and the infection."   Pertinent History pt is 44 you female presenting to outpatient OT s/p Right shoulder scop, debridement, acromioplasty, and debridement.  she began having pain in her right shoulder about 4 months ago, for unknown reasons.  pain did not subside, so pt went to Dr. Emilee Hero, and was subsequently referredn to Dr. Aline Brochure and then Dr. Mardelle Matte. Dr. Mardelle Matte preformed debridement on November 12th and pt has been in a sling since.  Pt reports she has begun weaning herself off sling.   Limitations Progress as tolerated   Special Tests FOTO 21/100   Patient Stated Goals "to get full use of my  shoulder"   Currently in Pain? Yes   Pain Score 6   at worst 9/10   Pain Location Shoulder   Pain Orientation Right   Pain Descriptors / Indicators Sore;Tender   Pain Type Acute pain   Pain Onset More than a month ago   Pain Frequency Constant          [facility] OT Assessment - 09/02/14 1157    Assessment   Diagnosis Right Shoulder Scope, Acromioplasty, and Debridement   Onset Date 07/30/14  surgery   Precautions   Precautions Shoulder   Type of Shoulder Precautions progress as tolerated   Restrictions   Weight Bearing Restrictions No   Balance Screen   Has the patient fallen in the past 6 months Yes   How many times? 1   Has the patient had a decrease in activity level because of a fear of falling?  Yes   Is the patient reluctant to leave their home because of a fear of falling?  No   Home  Environment   Family/patient expects to be discharged to: Private residence   Living Arrangements Alone   Prior Function   Level of Independence Independent with basic ADLs;Independent with homemaking with ambulation   Vocation On disability  for knees, back, and bunions on feet   Vocation Requirements n/a  Leisure TV, cooking, Holiday dinenrs   ADL   ADL comments Pt is currenlty having significantly increased difficulty with all ADL and IADL tasks due to RUE dominance.  she mentions in particular laundry, washing her hair, cooking, putting pots/pans away, and sweeping.   Vision - History   Baseline Vision Wears glasses all the time   Cognition   Overall Cognitive Status Within Functional Limits for tasks assessed   Observation/Other Assessments   Observations pt has 3 small incisions around R shoulder.  She has moderate fascial restrictions in her R upper arm and upper bicep regions and max fascial restrictions in her upper trap region.   Focus on Therapeutic Outcomes (FOTO)  FOTO 21/100   Sensation   Light Touch Appears Intact   Additional Comments Pt indicated numbness along R arm  and in all 5 fingertips.   Coordination   Gross Motor Movements are Fluid and Coordinated Not tested   Fine Motor Movements are Fluid and Coordinated Yes   AROM   Overall AROM  Deficits   Overall AROM Comments Assess in supine, with ER/IR adducted   Right Shoulder Flexion 18 Degrees   Right Shoulder ABduction 30 Degrees   Right Shoulder Internal Rotation 72 Degrees   Right Shoulder External Rotation 55 Degrees   PROM   Overall PROM  Deficits   Overall PROM Comments Assessed in supine, with ER/IR adducted   Right Shoulder Flexion 72 Degrees   Right Shoulder ABduction 83 Degrees   Right Shoulder Internal Rotation 76 Degrees   Right Shoulder External Rotation 46 Degrees   Strength   Overall Strength Unable to assess            OT Education - 09/02/14 1204    Education provided Yes   Education Details Pendulums and table slides   Person(s) Educated Patient   Methods Explanation;Demonstration;Handout   Comprehension Verbalized understanding;Returned demonstration          OT Short Term Goals - 09/02/14 1208    OT SHORT TERM GOAL #1   Title Pt will be educated on HEP   Time 4   Period Weeks   Status New   OT SHORT TERM GOAL #2   Title Pt will improve PROM in RUE to [facility] for improved ability to sweep the floor   Time 4   Period Weeks   Status New   OT SHORT TERM GOAL #3   Title Pt will decrease fascial restrictions in R shoulder to minimal for decreased pain with movement   Time 4   Period Weeks   Status New   OT SHORT TERM GOAL #4   Title Pt will decrease pain in R shoulder to less than 4/10 when engaging in daily activities   Time 4   Period Weeks   Status New          OT Long Term Goals - 09/02/14 1210    OT LONG TERM GOAL #1   Title Pt will achieve highest level of functioning in all ADL, IADL, and leisure activities   Time 8   Period Weeks   Status New   OT LONG TERM GOAL #2   Title Pt will improve AROM in RUE to [facility] for improved ability to wash her  hair independently.   Time 8   Period Weeks   Status New   OT LONG TERM GOAL #3   Title Pt will improve RUE strength to atleast 4+/5 for improved abiltiy to engage in cooking tasks.  Time 8   Period Weeks   Status New   OT LONG TERM GOAL #4   Title Pt will decrease fascial restrictions to less than minimal for decreased pain with motion   Time 8   Period Weeks   Status New   OT LONG TERM GOAL #5   Title Pt will have pain of less than 2/10 while engagin in daily tasks   Time 8   Period Weeks   Status New          Plan - 2014-09-13 1205    Clinical Impression Statement Pt is presenting to outpatient OT s/p right shoulder scope, acriomioplasty, and debridement on November 12th.  She current has incrased pain, decreased ROM, increased fascial restrictions, decreased strength, and decreased overall RUE functional use, lmiting her ADL and IADL engagement.   Rehab Potential Good   OT Frequency 2x / week   OT Duration 8 weeks   OT Treatment/Interventions Self-care/ADL training;Patient/family education;Therapeutic exercise;Therapeutic exercises;Therapeutic activities;Passive range of motion;Manual Therapy;Moist Heat;Cryotherapy   Plan Pt will benefit from skilled OT services to improve RUE ROM, strength, fascial restrictions, pain, and overall functioanl use.     Treatment Plan: Myofascial release and manual stretching, PROM, AAROM, AROM, general strengthening, proximal shoulder strengthening, scapular stabilization   OT Home Exercise Plan 09-14-2023 - pendulums and table slides   Consulted and Agree with Plan of Care Patient          G-Codes - 13-Sep-2014 1401    Functional Assessment Tool Used FOTO 21/100 (79% impaired)   Functional Limitation Carrying, moving and handling objects   Carrying, Moving and Handling Objects Current Status (H4388) At least 60 percent but less than 80 percent impaired, limited or restricted   Carrying, Moving and Handling Objects Goal Status (I7579) At least 20  percent but less than 40 percent impaired, limited or restricted       Problem List Patient Active Problem List   Diagnosis Date Noted  . Arthritis, shoulder region 04/27/2014  . Acute bursitis of right shoulder 02/16/2014  . Shoulder injury 02/16/2014  . 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 BUNION 03/17/2009  . H N P-LUMBAR 06/03/2008  . KNEE, ARTHRITIS, DEGEN./OSTEO 02/11/2008  . NEOPLASMS UNSPEC NATURE BONE SOFT TISSUE&SKIN 02/03/2008  . LOW BACK PAIN 02/03/2008  . DERANGEMENT MENISCUS 01/23/2008  . JOINT EFFUSION, LEFT KNEE 01/23/2008  . TEAR MEDIAL MENISCUS 01/23/2008    Bea Graff Clovis Warwick, MS, OTR/L [place] [phone] 09-13-14, 2:06 PM

## 2014-09-04 ENCOUNTER — Ambulatory Visit (HOSPITAL_COMMUNITY): Payer: Medicare Other

## 2014-09-07 ENCOUNTER — Ambulatory Visit (HOSPITAL_COMMUNITY): Payer: Medicare Other

## 2014-09-09 ENCOUNTER — Encounter (HOSPITAL_COMMUNITY): Payer: Medicare Other

## 2014-09-15 ENCOUNTER — Ambulatory Visit (HOSPITAL_COMMUNITY): Payer: Medicare Other | Admitting: Specialist

## 2014-09-22 ENCOUNTER — Ambulatory Visit (HOSPITAL_COMMUNITY): Payer: Medicare Other

## 2014-09-24 ENCOUNTER — Ambulatory Visit (HOSPITAL_COMMUNITY): Payer: Medicare Other

## 2014-09-29 ENCOUNTER — Ambulatory Visit (HOSPITAL_COMMUNITY): Payer: Medicare Other

## 2014-10-01 ENCOUNTER — Ambulatory Visit (HOSPITAL_COMMUNITY)
Admission: RE | Admit: 2014-10-01 | Discharge: 2014-10-01 | Disposition: A | Discharge status: Home / Self Care | Payer: Medicare Other | Source: Ambulatory Visit | Attending: Orthopedic Surgery | Admitting: Orthopedic Surgery

## 2014-10-01 DIAGNOSIS — R29898 Other symptoms and signs involving the musculoskeletal system: Secondary | ICD-10-CM | POA: Diagnosis not present

## 2014-10-01 DIAGNOSIS — M25511 Pain in right shoulder: Secondary | ICD-10-CM | POA: Diagnosis not present

## 2014-10-01 DIAGNOSIS — M7581 Other shoulder lesions, right shoulder: Secondary | ICD-10-CM | POA: Diagnosis not present

## 2014-10-01 DIAGNOSIS — M25611 Stiffness of right shoulder, not elsewhere classified: Secondary | ICD-10-CM

## 2014-10-01 NOTE — Therapy (Signed)
[facility] [address] [place], [place], [address] Phone: [phone]   Fax:  [phone]  Occupational Therapy Reassessment and Treatment  Patient Details  Name: Kirsten Walls MRN: [id number] Date of Birth: 12/02/1965 Referring Provider:  Johnny Bridge, MD  Encounter Date: 10/01/2014      OT End of Session - 10/01/14 1128    Visit Number 2   Number of Visits 16   Date for OT Re-Evaluation 09/30/14   Authorization Type Medicare and Medicaid   Authorization Time Period Before 12th visit   Authorization - Visit Number 2   Authorization - Number of Visits 12   OT Start Time 1020   OT Stop Time 1100   OT Time Calculation (min) 40 min   Activity Tolerance Patient tolerated treatment well   Behavior During Therapy [facility] for tasks assessed/performed      Past Medical History  Diagnosis Date  . Hypertension   . Arthritis   . Bronchitis     Past Surgical History  Procedure Laterality Date  . Cesarean section  x2  . Bunionectomy  both big toe  . Abdominal hysterectomy    . Tumor removal  left knee cap  . Knee surgery    . Foot surgery      There were no vitals taken for this visit.  Visit Diagnosis:  Pain in joint, shoulder region, right  Decreased range of motion of shoulder, right  Shoulder weakness        [facility] OT Assessment - 10/01/14 1032    Assessment   Diagnosis Right Shoulder Scope, Acromioplasty, and Debridement   Precautions   Precautions Shoulder   Type of Shoulder Precautions progress as tolerated   Observation/Other Assessments   Focus on Therapeutic Outcomes (FOTO)  FOTO: 38/100   AROM   Overall AROM Comments Assess in supine, with ER/IR adducted   Right Shoulder Flexion 141 Degrees  on eval: 18   Right Shoulder ABduction 113 Degrees  on eval: 30   Right Shoulder Internal Rotation 90 Degrees  on eval: 72   Right Shoulder External Rotation 90 Degrees  on eval: 55   PROM   Overall PROM  Comments Assessed in supine, with ER/IR adducted   Right Shoulder Flexion 150 Degrees  on eval: 72   Right Shoulder ABduction 140 Degrees  on eval: 83   Right Shoulder Internal Rotation 90 Degrees  on eval: 76   Right Shoulder External Rotation 90 Degrees  on eval: 46                OT Treatments/Exercises (OP) - 10/01/14 1041    Shoulder Exercises: Supine   Protraction PROM;5 reps;AAROM;10 reps   Horizontal ABduction PROM;5 reps;AAROM;10 reps   External Rotation PROM;5 reps;AAROM;10 reps   Internal Rotation PROM;5 reps;AAROM;10 reps   Flexion PROM;5 reps;AAROM;10 reps   ABduction PROM;5 reps;AAROM;10 reps   Manual Therapy   Manual Therapy Myofascial release   Myofascial Release Myofascial release and manual stretching to right upper arm, trapezius, and scapularis region to decrease fascial restrctions and increase joint mobility in a pain free zone.                OT Education - 10/01/14 1132    Education provided Yes   Education Details AAROM exercises   Person(s) Educated Patient   Methods Explanation;Demonstration;Handout   Comprehension Verbalized understanding          OT Short Term Goals - 10/01/14 1046  OT SHORT TERM GOAL #1   Title Pt will be educated on HEP   Status Achieved   OT SHORT TERM GOAL #2   Title Pt will improve PROM in RUE to [facility] for improved ability to sweep the floor   Status Achieved   OT SHORT TERM GOAL #3   Title Pt will decrease fascial restrictions in R shoulder to minimal for decreased pain with movement   Status On-going   OT SHORT TERM GOAL #4   Title Pt will decrease pain in R shoulder to less than 4/10 when engaging in daily activities   Status On-going           OT Long Term Goals - 10/25/14 [place] #1   Title Pt will achieve highest level of functioning in all ADL, IADL, and leisure activities   Status On-going   OT [place] #2   Title Pt will improve AROM in RUE to [facility] for  improved ability to wash her hair independently.   Status On-going   OT LONG TERM GOAL #3   Title Pt will improve RUE strength to atleast 4+/5 for improved abiltiy to engage in cooking tasks.   Status On-going   OT LONG TERM GOAL #4   Title Pt will decrease fascial restrictions to less than minimal for decreased pain with motion   Status On-going   OT LONG TERM GOAL #5   Title Pt will have pain of less than 2/10 while engagin in daily tasks   Status On-going               Plan - October 25, 2014 1128    Clinical Impression Statement A: Reassessment completed this date. patient had not returne to therapy since initial evaluation 1 month ago. Patient stated that there was sickness in her family and she was unable to come to therapy. Patient reports that she was completing HEP at home. Patient has met 2/4 STGs. Patient reports that she has not had the sling on for a week and a half. Patient is starting to use right arm a little more for daily tasks. Pain is an issue as patient states her pain medication has run out and she has had a hard time trying to get the MD office to refill it.    OT Frequency 2x / week   OT Duration 4 weeks   Plan P: Cont therapy 2x a week for 4 weeks. Add pulleys and attempt AAROM supine.    OT Home Exercise Plan AAROM exercises October 25, 2014          G-Codes - 10-25-2014 1132    Functional Assessment Tool Used FOTO score: 38/100 (62% impaired)   Functional Limitation Carrying, moving and handling objects   Carrying, Moving and Handling Objects Current Status [phone]) At least 60 percent but less than 80 percent impaired, limited or restricted   Carrying, Moving and Handling Objects Goal Status (X0940) At least 20 percent but less than 40 percent impaired, limited or restricted      Problem List Patient Active Problem List   Diagnosis Date Noted  . Arthritis, shoulder region 04/27/2014  . Acute bursitis of right shoulder 02/16/2014  . Shoulder injury 02/16/2014  .  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 BUNION 03/17/2009  . H N P-LUMBAR 06/03/2008  . KNEE, ARTHRITIS, DEGEN./OSTEO 02/11/2008  . NEOPLASMS UNSPEC NATURE BONE SOFT TISSUE&SKIN 02/03/2008  . LOW BACK PAIN 02/03/2008  . DERANGEMENT MENISCUS 01/23/2008  . JOINT  EFFUSION, LEFT KNEE 01/23/2008  . TEAR MEDIAL MENISCUS 01/23/2008    Ailene Ravel, OTR/L,CBIS  [phone]  10/01/2014, 11:35 AM  [facility] [address] [place], [place], [address] Phone: [phone]   Fax:  [phone]

## 2014-10-01 NOTE — Patient Instructions (Signed)
*  Do these exercises laying down to start then we will progress to doing them standing up.*  ROM: Flexion - Wand   Bring wand directly over head, leading with right side. Reach back until stretch is felt. Hold ___5_ seconds. Repeat _10___ times per set. Do ___1_ sets per session. Do __2__ sessions per day.     ROM: Abduction - Wand   Holding wand with left hand palm up, push wand directly out to side, leading with other hand palm down, until stretch is felt. Hold _5___ seconds. Repeat _10___ times per set. Do __1__ sets per session. Do __2__ sessions per day.    ROM: Horizontal Abduction / Adduction - Wand   Keeping both palms down, push right hand across body with other hand. Then pull back across body, keeping arms parallel to floor. Do not allow trunk to twist. Hold __5__ seconds. Repeat _10___ times per set. Do ___1_ sets per session. Do ___2_ sessions per day.    ROM: External / Internal Rotation - Wand   Holding wand with left hand palm up, push out from body with other hand, palm down. Keep both elbows bent. When stretch is felt, hold __5__ seconds. Repeat to other side, leading with same hand. Keep elbows bent. Repeat __10__ times per set. Do _1___ sets per session. Do __2__ sessions per day.

## 2014-10-06 ENCOUNTER — Encounter (HOSPITAL_COMMUNITY): Payer: Self-pay

## 2014-10-06 ENCOUNTER — Ambulatory Visit (HOSPITAL_COMMUNITY)
Admission: RE | Admit: 2014-10-06 | Discharge: 2014-10-06 | Disposition: A | Discharge status: Home / Self Care | Payer: Medicare Other | Source: Ambulatory Visit | Attending: Orthopedic Surgery | Admitting: Orthopedic Surgery

## 2014-10-06 DIAGNOSIS — M25511 Pain in right shoulder: Secondary | ICD-10-CM

## 2014-10-06 DIAGNOSIS — R29898 Other symptoms and signs involving the musculoskeletal system: Secondary | ICD-10-CM

## 2014-10-06 DIAGNOSIS — M7581 Other shoulder lesions, right shoulder: Secondary | ICD-10-CM | POA: Diagnosis not present

## 2014-10-06 DIAGNOSIS — M25611 Stiffness of right shoulder, not elsewhere classified: Secondary | ICD-10-CM

## 2014-10-06 NOTE — Therapy (Signed)
[facility] [place], [place], [address] Phone: [phone]   Fax:  [phone]  Occupational Therapy Treatment  Patient Details  Name: Kirsten Walls MRN: [id number] Date of Birth: [birth year] Referring Provider:  Johnny Bridge, MD  Encounter Date: 10/06/2014      OT End of Session - 10/06/14 1209    Visit Number 3   Number of Visits 16   Date for OT Re-Evaluation 09/30/14   Authorization Type Medicare and Medicaid   Authorization Time Period Before 12th visit   Authorization - Visit Number 3   Authorization - Number of Visits 12   OT Start Time 1020   OT Stop Time 1100   OT Time Calculation (min) 40 min   Activity Tolerance Patient tolerated treatment well   Behavior During Therapy [facility] for tasks assessed/performed      Past Medical History  Diagnosis Date  . Hypertension   . Arthritis   . Bronchitis     Past Surgical History  Procedure Laterality Date  . Cesarean section  x2  . Bunionectomy  both big toe  . Abdominal hysterectomy    . Tumor removal  left knee cap  . Knee surgery    . Foot surgery      There were no vitals taken for this visit.  Visit Diagnosis:  Pain in joint, shoulder region, right  Decreased range of motion of shoulder, right  Shoulder weakness      Subjective Assessment - 10/06/14 1035    Symptoms S: It feels like a shot. That's what the pain is.    Currently in Pain? Yes   Pain Score 6    Pain Location Shoulder   Pain Orientation Right   Pain Descriptors / Indicators Stabbing   Pain Type Acute pain          [facility] OT Assessment - 10/06/14 1036    Precautions   Precautions Shoulder   Type of Shoulder Precautions progress as tolerated               OT Treatments/Exercises (OP) - 10/06/14 1036    Shoulder Exercises: Supine   Protraction PROM;5 reps;AAROM;10 reps   Horizontal ABduction PROM;5 reps;AAROM;10 reps   External Rotation PROM;5 reps;AAROM;10  reps   Internal Rotation PROM;5 reps;AAROM;10 reps   Flexion PROM;5 reps;AAROM;10 reps   ABduction PROM;5 reps;AAROM;10 reps   Shoulder Exercises: Standing   Protraction AAROM;10 reps   Horizontal ABduction AAROM;10 reps   External Rotation AAROM;10 reps   Internal Rotation AAROM;10 reps   Flexion AAROM;10 reps   ABduction AAROM;10 reps   Manual Therapy   Manual Therapy Myofascial release   Myofascial Release Myofascial release and manual stretching to right upper arm, trapezius, and scapularis region to decrease fascial restrctions and increase joint mobility in a pain free zone.                    OT Short Term Goals - 10/06/14 1210    OT SHORT TERM GOAL #1   Title Pt will be educated on HEP   OT [place] #2   Title Pt will improve PROM in RUE to [facility] for improved ability to sweep the floor   OT SHORT TERM GOAL #3   Title Pt will decrease fascial restrictions in R shoulder to minimal for decreased pain with movement   Status On-going   OT SHORT TERM GOAL #4   Title Pt will decrease pain in  R shoulder to less than 4/10 when engaging in daily activities   Status On-going           OT Long Term Goals - 10/06/14 1210    OT LONG TERM GOAL #1   Title Pt will achieve highest level of functioning in all ADL, IADL, and leisure activities   Status On-going   OT [place] #2   Title Pt will improve AROM in RUE to [facility] for improved ability to wash her hair independently.   Status On-going   OT LONG TERM GOAL #3   Title Pt will improve RUE strength to atleast 4+/5 for improved abiltiy to engage in cooking tasks.   Status On-going   OT LONG TERM GOAL #4   Title Pt will decrease fascial restrictions to less than minimal for decreased pain with motion   Status On-going   OT LONG TERM GOAL #5   Title Pt will have pain of less than 2/10 while engagin in daily tasks   Status On-going               Plan - 10/06/14 1209    Clinical Impression Statement A:  Initiated AAROM standing this session. Patient tolerated well and had muscle fatigue that required her to take rest breaks. Difficulty performing Abduction supine due to pain.    Plan P: Add pulleys        Problem List Patient Active Problem List   Diagnosis Date Noted  . Arthritis, shoulder region 04/27/2014  . Acute bursitis of right shoulder 02/16/2014  . Shoulder injury 02/16/2014  . 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 BUNION 03/17/2009  . H N P-LUMBAR 06/03/2008  . KNEE, ARTHRITIS, DEGEN./OSTEO 02/11/2008  . NEOPLASMS UNSPEC NATURE BONE SOFT TISSUE&SKIN 02/03/2008  . LOW BACK PAIN 02/03/2008  . DERANGEMENT MENISCUS 01/23/2008  . JOINT EFFUSION, LEFT KNEE 01/23/2008  . TEAR MEDIAL MENISCUS 01/23/2008    Ailene Ravel, OTR/L,CBIS  [phone]  10/06/2014, 12:11 PM  [facility] [address] [place], [place], [address] Phone: [phone]   Fax:  [phone]

## 2014-10-08 ENCOUNTER — Ambulatory Visit (HOSPITAL_COMMUNITY): Payer: Medicare Other

## 2014-10-13 ENCOUNTER — Ambulatory Visit (HOSPITAL_COMMUNITY): Payer: Medicare Other | Admitting: Specialist

## 2014-10-15 ENCOUNTER — Ambulatory Visit (HOSPITAL_COMMUNITY): Payer: Medicare Other

## 2014-11-17 ENCOUNTER — Encounter (HOSPITAL_COMMUNITY): Payer: Self-pay

## 2014-11-17 NOTE — Therapy (Signed)
[facility] [address] [place], [place], [address] Phone: [phone]   Fax:  [phone]  Patient Details  Name: Kirsten Walls MRN: [id number] Date of Birth: 12/26/1965 Referring Provider:  No ref. provider found  Encounter Date: 11/17/2014  OCCUPATIONAL THERAPY DISCHARGE SUMMARY  Visits from Start of Care: 3  Current functional level related to goals / functional outcomes: Patient completed 3 therapy visits and has not returned to clinic since 10-06-14. No goals were met.    Remaining deficits: OT Long Term Goals - 10/06/14 1210    OT LONG TERM GOAL #1   Title Pt will achieve highest level of functioning in all ADL, IADL, and leisure activities   Status On-going   OT [place] #2   Title Pt will improve AROM in RUE to [facility] for improved ability to wash her hair independently.   Status On-going   OT LONG TERM GOAL #3   Title Pt will improve RUE strength to atleast 4+/5 for improved abiltiy to engage in cooking tasks.   Status On-going   OT LONG TERM GOAL #4   Title Pt will decrease fascial restrictions to less than minimal for decreased pain with motion   Status On-going   OT LONG TERM GOAL #5   Title Pt will have pain of less than 2/10 while engagin in daily tasks   Status On-going            Education / Equipment: Table slides and pendulum exercises.  Plan:                                                    Patient goals were not met. Patient is being discharged due to not returning since the last visit.  ?????        Ailene Ravel, OTR/L,CBIS  [phone]  11/17/2014, 5:20 PM  [facility] [address] [place], [place], [address] Phone: [phone]   Fax:  [phone]

## 2014-11-30 DIAGNOSIS — M542 Cervicalgia: Secondary | ICD-10-CM | POA: Diagnosis not present

## 2014-11-30 DIAGNOSIS — M7541 Impingement syndrome of right shoulder: Secondary | ICD-10-CM | POA: Diagnosis not present

## 2014-12-03 ENCOUNTER — Emergency Department (HOSPITAL_COMMUNITY)
Admission: EM | Admit: 2014-12-03 | Discharge: 2014-12-03 | Disposition: A | Discharge status: Home / Self Care | Payer: Medicare Other | Attending: Emergency Medicine | Admitting: Emergency Medicine

## 2014-12-03 ENCOUNTER — Encounter (HOSPITAL_COMMUNITY): Payer: Self-pay | Admitting: *Deleted

## 2014-12-03 DIAGNOSIS — Z880 Allergy status to penicillin: Secondary | ICD-10-CM | POA: Insufficient documentation

## 2014-12-03 DIAGNOSIS — I10 Essential (primary) hypertension: Secondary | ICD-10-CM | POA: Diagnosis not present

## 2014-12-03 DIAGNOSIS — J069 Acute upper respiratory infection, unspecified: Secondary | ICD-10-CM | POA: Insufficient documentation

## 2014-12-03 DIAGNOSIS — M1990 Unspecified osteoarthritis, unspecified site: Secondary | ICD-10-CM | POA: Diagnosis not present

## 2014-12-03 DIAGNOSIS — F17210 Nicotine dependence, cigarettes, uncomplicated: Secondary | ICD-10-CM | POA: Diagnosis not present

## 2014-12-03 DIAGNOSIS — Z720 Tobacco use: Secondary | ICD-10-CM | POA: Diagnosis not present

## 2014-12-03 DIAGNOSIS — R21 Rash and other nonspecific skin eruption: Secondary | ICD-10-CM | POA: Insufficient documentation

## 2014-12-03 DIAGNOSIS — J45909 Unspecified asthma, uncomplicated: Secondary | ICD-10-CM | POA: Diagnosis not present

## 2014-12-03 DIAGNOSIS — K0889 Other specified disorders of teeth and supporting structures: Secondary | ICD-10-CM

## 2014-12-03 DIAGNOSIS — H9209 Otalgia, unspecified ear: Secondary | ICD-10-CM | POA: Diagnosis not present

## 2014-12-03 DIAGNOSIS — Z79899 Other long term (current) drug therapy: Secondary | ICD-10-CM | POA: Insufficient documentation

## 2014-12-03 DIAGNOSIS — K088 Other specified disorders of teeth and supporting structures: Secondary | ICD-10-CM | POA: Insufficient documentation

## 2014-12-03 DIAGNOSIS — R05 Cough: Secondary | ICD-10-CM | POA: Diagnosis present

## 2014-12-03 HISTORY — DX: Unspecified asthma, uncomplicated: J45.909

## 2014-12-03 MED ORDER — HYDROCODONE-ACETAMINOPHEN 5-325 MG PO TABS: 1.0000 | ORAL_TABLET | ORAL | Status: DC | PRN

## 2014-12-03 MED ORDER — OXYCODONE-ACETAMINOPHEN 5-325 MG PO TABS
1.0000 | ORAL_TABLET | Freq: Once | ORAL | Status: AC
  Administered 2014-12-03: 1 via ORAL
  Filled 2014-12-03: qty 1

## 2014-12-03 MED ORDER — IPRATROPIUM BROMIDE 0.02 % IN SOLN: 0.5000 mg | Freq: Once | RESPIRATORY_TRACT | Status: DC

## 2014-12-03 MED ORDER — IPRATROPIUM-ALBUTEROL 0.5-2.5 (3) MG/3ML IN SOLN
RESPIRATORY_TRACT | Status: AC
  Administered 2014-12-03: 3 mL via RESPIRATORY_TRACT
  Filled 2014-12-03: qty 3

## 2014-12-03 MED ORDER — ALBUTEROL SULFATE (2.5 MG/3ML) 0.083% IN NEBU
INHALATION_SOLUTION | RESPIRATORY_TRACT | Status: AC
  Administered 2014-12-03: 2.5 mg via RESPIRATORY_TRACT
  Filled 2014-12-03: qty 3

## 2014-12-03 MED ORDER — ALBUTEROL SULFATE (2.5 MG/3ML) 0.083% IN NEBU: 5.0000 mg | INHALATION_SOLUTION | Freq: Once | RESPIRATORY_TRACT | Status: DC

## 2014-12-03 MED ORDER — ALBUTEROL SULFATE HFA 108 (90 BASE) MCG/ACT IN AERS
2.0000 | INHALATION_SPRAY | RESPIRATORY_TRACT | Status: DC | PRN
  Administered 2014-12-03: 2 via RESPIRATORY_TRACT
  Filled 2014-12-03: qty 6.7

## 2014-12-03 MED ORDER — ALBUTEROL SULFATE (2.5 MG/3ML) 0.083% IN NEBU
2.5000 mg | INHALATION_SOLUTION | Freq: Once | RESPIRATORY_TRACT | Status: AC
  Administered 2014-12-03: 2.5 mg via RESPIRATORY_TRACT

## 2014-12-03 MED ORDER — IPRATROPIUM-ALBUTEROL 0.5-2.5 (3) MG/3ML IN SOLN
3.0000 mL | Freq: Once | RESPIRATORY_TRACT | Status: AC
  Administered 2014-12-03: 3 mL via RESPIRATORY_TRACT

## 2014-12-03 MED ORDER — DOXYCYCLINE HYCLATE 100 MG PO CAPS: 100.0000 mg | ORAL_CAPSULE | Freq: Two times a day (BID) | ORAL | Status: DC

## 2014-12-03 NOTE — ED Notes (Signed)
Pt also states that she needs some pain pills for her teeth.

## 2014-12-03 NOTE — ED Notes (Signed)
PT states, "I have asthma and bronchitis" Cough began a week ago,  Mostly nonproductive. States runny nose and toothache. NAD at this time.

## 2014-12-03 NOTE — ED Provider Notes (Signed)
CSN: [id number]     Arrival date & time 12/03/14  3220 History  This chart was scribed for Davonna Belling, MD by Einar Pheasant, Medical Scribe. This patient was seen in room APA07/APA07 and the patient's care was started at 10:43 AM.    Chief Complaint  Patient presents with  . Cough   The history is provided by the patient and medical records. No language interpreter was used.   HPI Comments: Kirsten Walls is a 49 y.o. female with PMhx of asthama presents to the Emergency Department complaining of a gradual onset, gradually worsening persistent cough that started approximately 1 week ago. She is also complaining of associated chest congestion, rash to right shoulder and left thigh with associated pruritis, and dental pain, and hoarseness. Pt reports some possible sick contacts. She denies any new chemical exposures or detergent changes. Pt denies any fever, neck pain, sore throat, visual disturbance, CP, SOB, abdominal pain, nausea, emesis, diarrhea, urinary symptoms, back pain, HA, weakness, or numbness as associated symptoms.    Past Medical History  Diagnosis Date  . Hypertension   . Arthritis   . Bronchitis   . Asthma    Past Surgical History  Procedure Laterality Date  . Cesarean section  x2  . Bunionectomy  both big toe  . Abdominal hysterectomy    . Tumor removal  left knee cap  . Knee surgery    . Foot surgery     Family History  Problem Relation Age of Onset  . Asthma     History  Substance Use Topics  . Smoking status: Current Every Day Smoker -- 1.00 packs/day for 30 years    Types: Cigarettes  . Smokeless tobacco: Never Used  . Alcohol Use: No   OB History    Gravida Para Term Preterm AB TAB SAB Ectopic Multiple Living   4 4 4       4      Review of Systems  Constitutional: Negative for fever and chills.  HENT: Positive for congestion, ear pain and voice change.   Respiratory: Positive for cough. Negative for shortness of breath.   Gastrointestinal:  Negative for nausea, vomiting and abdominal pain.  Skin: Positive for rash.  All other systems reviewed and are negative.  Allergies  Penicillins; Azithromycin; and Sulfamethoxazole-trimethoprim  Home Medications   Prior to Admission medications   Medication Sig Start Date End Date Taking? Authorizing Provider  albuterol (VENTOLIN HFA) 108 (90 BASE) MCG/ACT inhaler Inhale 2 puffs into the lungs every 6 (six) hours as needed. For shortness of breath/asthma/bronchitis    Yes Historical Provider, MD  amLODipine (NORVASC) 5 MG tablet Take 5 mg by mouth daily.   Yes Historical Provider, MD  ibuprofen (ADVIL,MOTRIN) 800 MG tablet Take 800 mg by mouth 3 (three) times daily. For pain   Yes Historical Provider, MD  meloxicam (MOBIC) 15 MG tablet Take 15 mg by mouth daily as needed for pain.   Yes Historical Provider, MD  ondansetron (ZOFRAN) 4 MG tablet Take 4 mg by mouth every 8 (eight) hours as needed for nausea or vomiting.   Yes Historical Provider, MD  tiZANidine (ZANAFLEX) 2 MG tablet Take 2 mg by mouth every 8 (eight) hours.   Yes Historical Provider, MD  topiramate (TOPAMAX) 25 MG tablet Take 100 mg by mouth daily.   Yes Historical Provider, MD  doxycycline (VIBRAMYCIN) 100 MG capsule Take 1 capsule (100 mg total) by mouth 2 (two) times daily. 12/03/14   Davonna Belling, MD  HYDROcodone-acetaminophen (NORCO/VICODIN) 5-325 MG per tablet Take 1 tablet by mouth every 4 (four) hours as needed for moderate pain (cough). 12/03/14   Davonna Belling, MD  sulfamethoxazole-trimethoprim (SEPTRA DS) 800-160 MG per tablet Take 1 tablet by mouth 2 (two) times daily. Patient not taking: Reported on 12/03/2014 03/11/14   [name], MD   BP 130/79 mmHg  Pulse 69  Temp(Src) 98.2 F (36.8 C) (Oral)  Resp 16  Ht 5\' 3"  (1.6 m)  Wt 140 lb (63.504 kg)  BMI 24.81 kg/m2  SpO2 100%   Physical Exam  Constitutional: She appears well-developed and well-nourished. No distress.  HENT:  Head: Normocephalic and  atraumatic.  Mild erythema of posterior oropharynx. No exudates.  Left lower second to last tooth has a major cavity. No drainage noted. No gingival swelling.   Eyes: Conjunctivae are normal. Right eye exhibits no discharge. Left eye exhibits no discharge.  Neck: Neck supple.  Cardiovascular: Normal rate, regular rhythm and normal heart sounds.  Exam reveals no gallop and no friction rub.   No murmur heard. Pulmonary/Chest: Effort normal and breath sounds normal. No respiratory distress.  Harsh breath sound bilaterally right worse than left.  Abdominal: Soft. She exhibits no distension. There is no tenderness.  Musculoskeletal: She exhibits no edema or tenderness.  Neurological: She is alert.  Skin: Skin is warm and dry. Rash noted.  Slightly raised erythematous rash to medial thighs bilaterally. No induration.   Psychiatric: She has a normal mood and affect. Her behavior is normal. Thought content normal.  Nursing note and vitals reviewed.   ED Course  Procedures (including critical care time)  10:47 AM- Pt advised of plan for treatment and pt agrees.  Labs Review Labs Reviewed - No data to display  Imaging Review No results found.   EKG Interpretation None      MDM   Final diagnoses:  URI (upper respiratory infection)  Pain, dental    Patient with URI and dental pain. Does have localizing lung findings and will give antibiotics. Will follow with her dentist. Will discharge home. I personally performed the services described in this documentation, which was scribed in my presence. The recorded information has been reviewed and is accurate.     Davonna Belling, MD 12/03/14 [phone]

## 2014-12-03 NOTE — Discharge Instructions (Signed)
Upper Respiratory Infection, Adult An upper respiratory infection (URI) is also sometimes known as the common cold. The upper respiratory tract includes the nose, sinuses, throat, trachea, and bronchi. Bronchi are the airways leading to the lungs. Most people improve within 1 week, but symptoms can last up to 2 weeks. A residual cough may last even longer.  CAUSES Many different viruses can infect the tissues lining the upper respiratory tract. The tissues become irritated and inflamed and often become very moist. Mucus production is also common. A cold is contagious. You can easily spread the virus to others by oral contact. This includes kissing, sharing a glass, coughing, or sneezing. Touching your mouth or nose and then touching a surface, which is then touched by another person, can also spread the virus. SYMPTOMS  Symptoms typically develop 1 to 3 days after you come in contact with a cold virus. Symptoms vary from person to person. They may include:  Runny nose.  Sneezing.  Nasal congestion.  Sinus irritation.  Sore throat.  Loss of voice (laryngitis).  Cough.  Fatigue.  Muscle aches.  Loss of appetite.  Headache.  Low-grade fever. DIAGNOSIS  You might diagnose your own cold based on familiar symptoms, since most people get a cold 2 to 3 times a year. Your caregiver can confirm this based on your exam. Most importantly, your caregiver can check that your symptoms are not due to another disease such as strep throat, sinusitis, pneumonia, asthma, or epiglottitis. Blood tests, throat tests, and X-rays are not necessary to diagnose a common cold, but they may sometimes be helpful in excluding other more serious diseases. Your caregiver will decide if any further tests are required. RISKS AND COMPLICATIONS  You may be at risk for a more severe case of the common cold if you smoke cigarettes, have chronic heart disease (such as heart failure) or lung disease (such as asthma), or if  you have a weakened immune system. The very young and very old are also at risk for more serious infections. Bacterial sinusitis, middle ear infections, and bacterial pneumonia can complicate the common cold. The common cold can worsen asthma and chronic obstructive pulmonary disease (COPD). Sometimes, these complications can require emergency medical care and may be life-threatening. PREVENTION  The best way to protect against getting a cold is to practice good hygiene. Avoid oral or hand contact with people with cold symptoms. Wash your hands often if contact occurs. There is no clear evidence that vitamin C, vitamin E, echinacea, or exercise reduces the chance of developing a cold. However, it is always recommended to get plenty of rest and practice good nutrition. TREATMENT  Treatment is directed at relieving symptoms. There is no cure. Antibiotics are not effective, because the infection is caused by a virus, not by bacteria. Treatment may include:  Increased fluid intake. Sports drinks offer valuable electrolytes, sugars, and fluids.  Breathing heated mist or steam (vaporizer or shower).  Eating chicken soup or other clear broths, and maintaining good nutrition.  Getting plenty of rest.  Using gargles or lozenges for comfort.  Controlling fevers with ibuprofen or acetaminophen as directed by your caregiver.  Increasing usage of your inhaler if you have asthma. Zinc gel and zinc lozenges, taken in the first 24 hours of the common cold, can shorten the duration and lessen the severity of symptoms. Pain medicines may help with fever, muscle aches, and throat pain. A variety of non-prescription medicines are available to treat congestion and runny nose. Your caregiver   can make recommendations and may suggest nasal or lung inhalers for other symptoms.  HOME CARE INSTRUCTIONS   Only take over-the-counter or prescription medicines for pain, discomfort, or fever as directed by your  caregiver.  Use a warm mist humidifier or inhale steam from a shower to increase air moisture. This may keep secretions moist and make it easier to breathe.  Drink enough water and fluids to keep your urine clear or pale yellow.  Rest as needed.  Return to work when your temperature has returned to normal or as your caregiver advises. You may need to stay home longer to avoid infecting others. You can also use a face mask and careful hand washing to prevent spread of the virus. SEEK MEDICAL CARE IF:   After the first few days, you feel you are getting worse rather than better.  You need your caregiver's advice about medicines to control symptoms.  You develop chills, worsening shortness of breath, or brown or red sputum. These may be signs of pneumonia.  You develop yellow or brown nasal discharge or pain in the face, especially when you bend forward. These may be signs of sinusitis.  You develop a fever, swollen neck glands, pain with swallowing, or white areas in the back of your throat. These may be signs of strep throat. SEEK IMMEDIATE MEDICAL CARE IF:   You have a fever.  You develop severe or persistent headache, ear pain, sinus pain, or chest pain.  You develop wheezing, a prolonged cough, cough up blood, or have a change in your usual mucus (if you have chronic lung disease).  You develop sore muscles or a stiff neck. Document Released: 02/28/2001 Document Revised: 11/27/2011 Document Reviewed: 12/10/2013 Exit[employer] Patient Information 2015 [employer]. This information is not intended to replace advice given to you by your health care provider. Make sure you discuss any questions you have with your health care provider.  

## 2014-12-03 NOTE — ED Notes (Signed)
Pt states that she is ready to go especially if she is not getting anything for her tooth pain.

## 2014-12-03 NOTE — ED Notes (Signed)
MD aware pt is still requesting pain medication for her tooth.

## 2014-12-03 NOTE — ED Notes (Signed)
Pt states that she is getting tired of waiting and that she is hungry and needs to go eat.  Offered to get pt a lunch tray which pt refused stating "I don't want nasty hospital food".  Apologized for delay but explained that MD was with critical pts at this time.  Pt states that she did not care because she did not need to wait on him but that he needed to hurry up.  Pt began cursing about wait time and then refused to speak to nurse after being told not to use foul language.  Pt advised that she could leave at any time.

## 2015-01-11 DIAGNOSIS — M7541 Impingement syndrome of right shoulder: Secondary | ICD-10-CM | POA: Diagnosis not present

## 2015-02-05 ENCOUNTER — Other Ambulatory Visit (HOSPITAL_COMMUNITY): Payer: Self-pay | Admitting: Internal Medicine

## 2015-02-05 DIAGNOSIS — R739 Hyperglycemia, unspecified: Secondary | ICD-10-CM | POA: Diagnosis not present

## 2015-02-05 DIAGNOSIS — E780 Pure hypercholesterolemia: Secondary | ICD-10-CM | POA: Diagnosis not present

## 2015-02-05 DIAGNOSIS — I10 Essential (primary) hypertension: Secondary | ICD-10-CM | POA: Diagnosis not present

## 2015-02-05 DIAGNOSIS — Z1231 Encounter for screening mammogram for malignant neoplasm of breast: Secondary | ICD-10-CM

## 2015-02-05 DIAGNOSIS — F17290 Nicotine dependence, other tobacco product, uncomplicated: Secondary | ICD-10-CM | POA: Diagnosis not present

## 2015-02-05 DIAGNOSIS — R569 Unspecified convulsions: Secondary | ICD-10-CM | POA: Diagnosis not present

## 2015-02-05 DIAGNOSIS — M25511 Pain in right shoulder: Secondary | ICD-10-CM | POA: Diagnosis not present

## 2015-02-05 DIAGNOSIS — F17200 Nicotine dependence, unspecified, uncomplicated: Secondary | ICD-10-CM | POA: Diagnosis not present

## 2015-02-05 DIAGNOSIS — J40 Bronchitis, not specified as acute or chronic: Secondary | ICD-10-CM | POA: Diagnosis not present

## 2015-02-05 DIAGNOSIS — M1990 Unspecified osteoarthritis, unspecified site: Secondary | ICD-10-CM | POA: Diagnosis not present

## 2015-02-09 ENCOUNTER — Encounter: Payer: Self-pay | Admitting: *Deleted

## 2015-02-22 ENCOUNTER — Ambulatory Visit (INDEPENDENT_AMBULATORY_CARE_PROVIDER_SITE_OTHER): Payer: Medicare Other | Admitting: Obstetrics and Gynecology

## 2015-02-22 ENCOUNTER — Encounter: Payer: Self-pay | Admitting: Obstetrics and Gynecology

## 2015-02-22 VITALS — BP 112/64 | HR 64 | Ht 60.75 in | Wt 139.4 lb

## 2015-02-22 DIAGNOSIS — Z01419 Encounter for gynecological examination (general) (routine) without abnormal findings: Secondary | ICD-10-CM

## 2015-02-22 DIAGNOSIS — Z0000 Encounter for general adult medical examination without abnormal findings: Secondary | ICD-10-CM

## 2015-02-22 NOTE — Progress Notes (Signed)
Patient ID: Kirsten Walls, female   DOB: 03-May-1966, 49 y.o.   MRN: [id number]   Assessment:  Annual Gyn Exam, s/p cervix removal athysterectomy, thus no need 4  Pap.   Plan:  1. return q21yr    or prn 2.   Annual mammogram advised , hemoccult annually thru [facility].  Subjective:  Kirsten Walls is a 49 y.o. female, s/p abdominal hysterectomy, [id number] who presents for annual exam. No LMP recorded. Patient has had a hysterectomy. The patient has no complaints today. She was referred by Dr. Legrand Rams for annual pap and exam. Pt endorses regular exercise and bikes up to 2 miles.  She denies a history of cardiac issues.   The following portions of the patient's history were reviewed and updated as appropriate: allergies, current medications, past family history, past medical history, past social history, past surgical history and problem list. Past Medical History  Diagnosis Date  . Hypertension   . Arthritis   . Bronchitis   . Asthma   . Seizures     Past Surgical History  Procedure Laterality Date  . Cesarean section  x2  . Bunionectomy  both big toe  . Abdominal hysterectomy    . Tumor removal  left knee cap  . Knee surgery    . Foot surgery       Current outpatient prescriptions:  .  albuterol (VENTOLIN HFA) 108 (90 BASE) MCG/ACT inhaler, Inhale 2 puffs into the lungs every 6 (six) hours as needed. For shortness of breath/asthma/bronchitis , Disp: , Rfl:  .  ALPRAZolam (XANAX) 0.5 MG tablet, Take 0.5 mg by mouth at bedtime as needed for anxiety., Disp: , Rfl:  .  amLODipine (NORVASC) 5 MG tablet, Take 5 mg by mouth daily., Disp: , Rfl:  .  ibuprofen (ADVIL,MOTRIN) 800 MG tablet, Take 800 mg by mouth 3 (three) times daily. For pain, Disp: , Rfl:  .  meloxicam (MOBIC) 15 MG tablet, Take 15 mg by mouth daily as needed for pain., Disp: , Rfl:  .  tiZANidine (ZANAFLEX) 2 MG tablet, Take 2 mg by mouth every 8 (eight) hours., Disp: , Rfl:  .  topiramate (TOPAMAX) 25 MG tablet, Take  100 mg by mouth daily., Disp: , Rfl:  .  doxycycline (VIBRAMYCIN) 100 MG capsule, Take 1 capsule (100 mg total) by mouth 2 (two) times daily. (Patient not taking: Reported on 02/22/2015), Disp: 14 capsule, Rfl: 0 .  HYDROcodone-acetaminophen (NORCO/VICODIN) 5-325 MG per tablet, Take 1 tablet by mouth every 4 (four) hours as needed for moderate pain (cough). (Patient not taking: Reported on 02/22/2015), Disp: 8 tablet, Rfl: 0 .  methocarbamol (ROBAXIN) 500 MG tablet, , Disp: , Rfl:  .  ondansetron (ZOFRAN) 4 MG tablet, Take 4 mg by mouth every 8 (eight) hours as needed for nausea or vomiting., Disp: , Rfl:  .  pantoprazole (PROTONIX) 40 MG tablet, , Disp: , Rfl:  .  sulfamethoxazole-trimethoprim (SEPTRA DS) 800-160 MG per tablet, Take 1 tablet by mouth 2 (two) times daily. (Patient not taking: Reported on 12/03/2014), Disp: 10 tablet, Rfl: 0  Review of Systems Constitutional: negative Gastrointestinal: negative Genitourinary: negative  Objective:  BP 112/64 mmHg  Pulse 64  Ht 5' 0.75" (1.543 m)  Wt 139 lb 6.4 oz (63.231 kg)  BMI 26.56 kg/m2   BMI: Body mass index is 26.56 kg/(m^2).  General Appearance: Alert, appropriate appearance for age. No acute distress HEENT: Grossly normal Neck / Thyroid:  Cardiovascular: RRR; normal S1, S2, no murmur Lungs:  CTA bilaterally Back: No CVAT Breast Exam: No dimpling, nipple retraction or discharge. No masses or nodes., Normal to inspection and No masses or nodes.No dimpling, nipple retraction or discharge. Gastrointestinal: Soft, non-tender, no masses or organomegaly Pelvic Exam: External genitalia: normal general appearance Vaginal: normal mucosa without prolapse or lesions and normal without tenderness, induration or masses Cervix: removed surgically Adnexa: removed surgically Uterus: removed surgically Rectovaginal: not indicated and guaiac negative stool obtained Lymphatic Exam: Non-palpable nodes in neck, clavicular, axillary, or inguinal regions   Skin: no rash or abnormalities Neurologic: Normal gait and speech, no tremor  Psychiatric: Alert and oriented, appropriate affect.  Urinalysis:Not done  Hemoccult: Negative  Mallory Shirk. MD Pgr [phone] 11:54 AM   This chart was scribed for Jonnie Kind, MD by Tula Nakayama, ED Scribe. This patient was seen in room 2 and the patient's care was started at 11:54 AM.   I personally performed the services described in this documentation, which was SCRIBED in my presence. The recorded information has been reviewed and considered accurate. It has been edited as necessary during review. Jonnie Kind, MD

## 2015-02-25 ENCOUNTER — Ambulatory Visit (HOSPITAL_COMMUNITY): Payer: Medicare Other

## 2015-03-25 DIAGNOSIS — R569 Unspecified convulsions: Secondary | ICD-10-CM | POA: Diagnosis not present

## 2015-03-25 DIAGNOSIS — M545 Low back pain: Secondary | ICD-10-CM | POA: Diagnosis not present

## 2015-03-25 DIAGNOSIS — I10 Essential (primary) hypertension: Secondary | ICD-10-CM | POA: Diagnosis not present

## 2015-04-22 ENCOUNTER — Encounter (HOSPITAL_COMMUNITY): Payer: Self-pay | Admitting: Emergency Medicine

## 2015-04-22 ENCOUNTER — Emergency Department (HOSPITAL_COMMUNITY)
Admission: EM | Admit: 2015-04-22 | Discharge: 2015-04-22 | Disposition: A | Discharge status: Home / Self Care | Payer: Medicare Other | Attending: Emergency Medicine | Admitting: Emergency Medicine

## 2015-04-22 DIAGNOSIS — Z79899 Other long term (current) drug therapy: Secondary | ICD-10-CM | POA: Diagnosis not present

## 2015-04-22 DIAGNOSIS — Y998 Other external cause status: Secondary | ICD-10-CM | POA: Diagnosis not present

## 2015-04-22 DIAGNOSIS — Z720 Tobacco use: Secondary | ICD-10-CM | POA: Insufficient documentation

## 2015-04-22 DIAGNOSIS — G40909 Epilepsy, unspecified, not intractable, without status epilepticus: Secondary | ICD-10-CM | POA: Insufficient documentation

## 2015-04-22 DIAGNOSIS — K088 Other specified disorders of teeth and supporting structures: Secondary | ICD-10-CM | POA: Insufficient documentation

## 2015-04-22 DIAGNOSIS — Y9389 Activity, other specified: Secondary | ICD-10-CM | POA: Insufficient documentation

## 2015-04-22 DIAGNOSIS — S0591XA Unspecified injury of right eye and orbit, initial encounter: Secondary | ICD-10-CM | POA: Diagnosis present

## 2015-04-22 DIAGNOSIS — S0501XA Injury of conjunctiva and corneal abrasion without foreign body, right eye, initial encounter: Secondary | ICD-10-CM

## 2015-04-22 DIAGNOSIS — Z791 Long term (current) use of non-steroidal anti-inflammatories (NSAID): Secondary | ICD-10-CM | POA: Insufficient documentation

## 2015-04-22 DIAGNOSIS — M1990 Unspecified osteoarthritis, unspecified site: Secondary | ICD-10-CM | POA: Diagnosis not present

## 2015-04-22 DIAGNOSIS — L309 Dermatitis, unspecified: Secondary | ICD-10-CM | POA: Diagnosis not present

## 2015-04-22 DIAGNOSIS — J45909 Unspecified asthma, uncomplicated: Secondary | ICD-10-CM | POA: Diagnosis not present

## 2015-04-22 DIAGNOSIS — K029 Dental caries, unspecified: Secondary | ICD-10-CM | POA: Diagnosis not present

## 2015-04-22 DIAGNOSIS — Y9289 Other specified places as the place of occurrence of the external cause: Secondary | ICD-10-CM | POA: Diagnosis not present

## 2015-04-22 DIAGNOSIS — W500XXA Accidental hit or strike by another person, initial encounter: Secondary | ICD-10-CM | POA: Insufficient documentation

## 2015-04-22 DIAGNOSIS — I10 Essential (primary) hypertension: Secondary | ICD-10-CM | POA: Diagnosis not present

## 2015-04-22 DIAGNOSIS — Z880 Allergy status to penicillin: Secondary | ICD-10-CM | POA: Diagnosis not present

## 2015-04-22 DIAGNOSIS — K0889 Other specified disorders of teeth and supporting structures: Secondary | ICD-10-CM

## 2015-04-22 MED ORDER — PREDNISONE 10 MG PO TABS: ORAL_TABLET | ORAL | Status: DC

## 2015-04-22 MED ORDER — CLINDAMYCIN HCL 300 MG PO CAPS: 300.0000 mg | ORAL_CAPSULE | Freq: Four times a day (QID) | ORAL | Status: DC

## 2015-04-22 MED ORDER — NAPROXEN 500 MG PO TABS: 500.0000 mg | ORAL_TABLET | Freq: Two times a day (BID) | ORAL | Status: DC

## 2015-04-22 MED ORDER — CLINDAMYCIN HCL 150 MG PO CAPS
300.0000 mg | ORAL_CAPSULE | Freq: Once | ORAL | Status: AC
Start: 2015-04-22 — End: 2015-04-22
  Administered 2015-04-22: 300 mg via ORAL
  Filled 2015-04-22: qty 2

## 2015-04-22 MED ORDER — TOBRAMYCIN 0.3 % OP SOLN
1.0000 [drp] | Freq: Once | OPHTHALMIC | Status: AC
  Administered 2015-04-22: 1 [drp] via OPHTHALMIC
  Filled 2015-04-22: qty 5

## 2015-04-22 MED ORDER — FLUORESCEIN SODIUM 1 MG OP STRP
1.0000 | ORAL_STRIP | Freq: Once | OPHTHALMIC | Status: AC
  Administered 2015-04-22: 1 via OPHTHALMIC
  Filled 2015-04-22: qty 1

## 2015-04-22 MED ORDER — TETRACAINE HCL 0.5 % OP SOLN
2.0000 [drp] | Freq: Once | OPHTHALMIC | Status: AC
  Administered 2015-04-22: 2 [drp] via OPHTHALMIC
  Filled 2015-04-22: qty 2

## 2015-04-22 MED ORDER — KETOROLAC TROMETHAMINE 0.5 % OP SOLN
1.0000 [drp] | Freq: Once | OPHTHALMIC | Status: AC
  Administered 2015-04-22: 1 [drp] via OPHTHALMIC
  Filled 2015-04-22: qty 5
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
## 2015-05-28 ENCOUNTER — Emergency Department (HOSPITAL_COMMUNITY)
Admission: EM | Admit: 2015-05-28 | Discharge: 2015-05-28 | Disposition: A | Discharge status: Home / Self Care | Payer: Medicare Other | Attending: Emergency Medicine | Admitting: Emergency Medicine

## 2015-05-28 ENCOUNTER — Encounter (HOSPITAL_COMMUNITY): Payer: Self-pay | Admitting: Emergency Medicine

## 2015-05-28 DIAGNOSIS — K0889 Other specified disorders of teeth and supporting structures: Secondary | ICD-10-CM

## 2015-05-28 DIAGNOSIS — J45909 Unspecified asthma, uncomplicated: Secondary | ICD-10-CM | POA: Diagnosis not present

## 2015-05-28 DIAGNOSIS — K088 Other specified disorders of teeth and supporting structures: Secondary | ICD-10-CM | POA: Diagnosis not present

## 2015-05-28 DIAGNOSIS — Z880 Allergy status to penicillin: Secondary | ICD-10-CM | POA: Insufficient documentation

## 2015-05-28 DIAGNOSIS — M1990 Unspecified osteoarthritis, unspecified site: Secondary | ICD-10-CM | POA: Insufficient documentation

## 2015-05-28 DIAGNOSIS — Z720 Tobacco use: Secondary | ICD-10-CM | POA: Insufficient documentation

## 2015-05-28 DIAGNOSIS — Z79899 Other long term (current) drug therapy: Secondary | ICD-10-CM | POA: Diagnosis not present

## 2015-05-28 DIAGNOSIS — I10 Essential (primary) hypertension: Secondary | ICD-10-CM | POA: Diagnosis not present

## 2015-05-28 DIAGNOSIS — B86 Scabies: Secondary | ICD-10-CM | POA: Diagnosis not present

## 2015-05-28 MED ORDER — TRIAMCINOLONE ACETONIDE 0.5 % EX OINT: 1.0000 "application " | TOPICAL_OINTMENT | Freq: Two times a day (BID) | CUTANEOUS | Status: DC

## 2015-05-28 MED ORDER — CEPHALEXIN 500 MG PO CAPS: 500.0000 mg | ORAL_CAPSULE | Freq: Four times a day (QID) | ORAL | Status: DC

## 2015-05-28 MED ORDER — NAPROXEN 500 MG PO TABS: 500.0000 mg | ORAL_TABLET | Freq: Two times a day (BID) | ORAL | Status: DC

## 2015-05-28 MED ORDER — PERMETHRIN 5 % EX CREA: TOPICAL_CREAM | CUTANEOUS | Status: DC

## 2015-05-28 NOTE — ED Notes (Addendum)
Patient c/o rash to entire body and left lower dental pain. Patient seen here in ER and given antibiotic, prednisone, and pain medication. Per patient nothing has helped with dental pain or rash.

## 2015-05-28 NOTE — Discharge Instructions (Signed)

## 2015-05-28 NOTE — ED Provider Notes (Signed)
CSN: [id number]     Arrival date & time 05/28/15  1117 History   First MD Initiated Contact with Patient 05/28/15 1210     Chief Complaint  Patient presents with  . Rash  . Dental Pain     (Consider location/radiation/quality/duration/timing/severity/associated sxs/prior Treatment) Patient is a 49 y.o. female presenting with rash. The history is provided by the patient.  Rash Location:  Full body Quality: itchiness and redness   Severity:  Moderate Onset quality:  Gradual Timing:  Constant Progression:  Worsening Chronicity:  Recurrent Relieved by:  Nothing Ineffective treatments:  None tried Associated symptoms: no fever   Pt complains of a toothache. Pt reports she is suppose to see a dentist  Past Medical History  Diagnosis Date  . Hypertension   . Arthritis   . Bronchitis   . Asthma   . Seizures    Past Surgical History  Procedure Laterality Date  . Cesarean section  x2  . Bunionectomy  both big toe  . Abdominal hysterectomy    . Tumor removal  left knee cap  . Knee surgery    . Foot surgery     Family History  Problem Relation Age of Onset  . Asthma    . Hypertension Mother   . Hyperlipidemia Mother    Social History  Substance Use Topics  . Smoking status: Current Every Day Smoker -- 0.50 packs/day for 30 years    Types: Cigarettes  . Smokeless tobacco: Never Used  . Alcohol Use: No   OB History    Gravida Para Term Preterm AB TAB SAB Ectopic Multiple Living   4 4 4       4      Review of Systems  Constitutional: Negative for fever.  Skin: Positive for rash.  All other systems reviewed and are negative.     Allergies  Penicillins; Azithromycin; and Sulfamethoxazole-trimethoprim  Home Medications   Prior to Admission medications   Medication Sig Start Date End Date Taking? Authorizing Provider  albuterol (VENTOLIN HFA) 108 (90 BASE) MCG/ACT inhaler Inhale 2 puffs into the lungs every 6 (six) hours as needed. For shortness of  breath/asthma/bronchitis    Yes Historical Provider, MD  ALPRAZolam Duanne Moron) 0.5 MG tablet Take 0.5 mg by mouth at bedtime as needed for anxiety.   Yes Historical Provider, MD  amLODipine (NORVASC) 5 MG tablet Take 5 mg by mouth daily.   Yes Historical Provider, MD  meloxicam (MOBIC) 15 MG tablet Take 15 mg by mouth daily as needed for pain.   Yes Historical Provider, MD  methocarbamol (ROBAXIN) 500 MG tablet Take 500 mg by mouth every 6 (six) hours as needed.  02/02/15  Yes Historical Provider, MD  pantoprazole (PROTONIX) 40 MG tablet Take 40 mg by mouth daily.  02/11/15  Yes Historical Provider, MD  tiZANidine (ZANAFLEX) 2 MG tablet Take 2 mg by mouth every 8 (eight) hours.   Yes Historical Provider, MD  topiramate (TOPAMAX) 25 MG tablet Take 100 mg by mouth daily.   Yes Historical Provider, MD  clindamycin (CLEOCIN) 300 MG capsule Take 1 capsule (300 mg total) by mouth 4 (four) times daily. For 7 days Patient not taking: Reported on 05/28/2015 04/22/15   Tammy Triplett, PA-C  doxycycline (VIBRAMYCIN) 100 MG capsule Take 1 capsule (100 mg total) by mouth 2 (two) times daily. Patient not taking: Reported on 02/22/2015 12/03/14   Davonna Belling, MD  HYDROcodone-acetaminophen (NORCO/VICODIN) 5-325 MG per tablet Take 1 tablet by mouth every 4 (four)  hours as needed for moderate pain (cough). Patient not taking: Reported on 02/22/2015 12/03/14   Davonna Belling, MD  naproxen (NAPROSYN) 500 MG tablet Take 1 tablet (500 mg total) by mouth 2 (two) times daily with a meal. As needed for pain Patient not taking: Reported on 05/28/2015 04/22/15   Tammy Triplett, PA-C  predniSONE (DELTASONE) 10 MG tablet Take 6 tablets day one, 5 tablets day two, 4 tablets day three, 3 tablets day four, 2 tablets day five, then 1 tablet day six Patient not taking: Reported on 05/28/2015 04/22/15   Tammy Triplett, PA-C  sulfamethoxazole-trimethoprim (SEPTRA DS) 800-160 MG per tablet Take 1 tablet by mouth 2 (two) times daily. Patient not  taking: Reported on 12/03/2014 03/11/14   [name], MD   BP 97/66 mmHg  Pulse 62  Temp(Src) 98.1 F (36.7 C) (Oral)  Resp 18  Ht 5\' 3"  (1.6 m)  Wt 140 lb (63.504 kg)  BMI 24.81 kg/m2  SpO2 100% Physical Exam  Constitutional: She is oriented to person, place, and time. She appears well-developed and well-nourished.  HENT:  Head: Normocephalic.  Right Ear: External ear normal.  Left Ear: External ear normal.  Dental decay,   Eyes: EOM are normal. Pupils are equal, round, and reactive to light.  Neck: Normal range of motion.  Cardiovascular: Normal rate and normal heart sounds.   Pulmonary/Chest: Effort normal.  Abdominal: She exhibits no distension.  Musculoskeletal: Normal range of motion.  Neurological: She is alert and oriented to person, place, and time.  Skin: Rash noted. There is erythema.  Psychiatric: She has a normal mood and affect.  Nursing note and vitals reviewed.   ED Course  Procedures (including critical care time) Labs Review Labs Reviewed - No data to display  Imaging Review No results found. I have personally reviewed and evaluated these images and lab results as part of my medical decision-making.   EKG Interpretation None      MDM rash looks like scabies,   I will treat with elemite,  Will given triamcinalone for inflammation.  Pt advised follow up with dentist.    Final diagnoses:  Scabies  Toothache   Meds ordered this encounter  Medications  . naproxen (NAPROSYN) 500 MG tablet    Sig: Take 1 tablet (500 mg total) by mouth 2 (two) times daily with a meal. As needed for pain    Dispense:  20 tablet    Refill:  0    Order Specific Question:  Supervising Provider    Answer:  MILLER, BRIAN [[id number]]  . permethrin (ELIMITE) 5 % cream    Sig: Apply to affected area once    Dispense:  60 g    Refill:  0    Order Specific Question:  Supervising Provider    Answer:  Sabra Heck, BRIAN [[id number]]  . triamcinolone ointment (KENALOG) 0.5 %    Sig: Apply 1  application topically 2 (two) times daily.    Dispense:  30 g    Refill:  0    Order Specific Question:  Supervising Provider    Answer:  MILLER, BRIAN [[id number]]  . cephALEXin (KEFLEX) 500 MG capsule    Sig: Take 1 capsule (500 mg total) by mouth 4 (four) times daily.    Dispense:  40 capsule    Refill:  0    Order Specific Question:  Supervising Provider    Answer:  Noemi Chapel [[id number]]   avs Schedule dental evaluation    Fransico Meadow, PA-C 05/28/15  [place], MD 05/29/15 1022

## 2015-05-28 NOTE — ED Notes (Signed)
Reviewed discharge instructions with patient including medications, rationale for medications, and diagnosis of scabies. Information regarding scabies and list of dental resources also provided. Offered work/school note, patient declined. Patient left ambulatory NAD.

## 2015-07-01 ENCOUNTER — Other Ambulatory Visit (HOSPITAL_COMMUNITY): Payer: Self-pay | Admitting: Internal Medicine

## 2015-07-01 DIAGNOSIS — F17290 Nicotine dependence, other tobacco product, uncomplicated: Secondary | ICD-10-CM | POA: Diagnosis not present

## 2015-07-01 DIAGNOSIS — R569 Unspecified convulsions: Secondary | ICD-10-CM | POA: Diagnosis not present

## 2015-07-01 DIAGNOSIS — Z1231 Encounter for screening mammogram for malignant neoplasm of breast: Secondary | ICD-10-CM

## 2015-07-01 DIAGNOSIS — I10 Essential (primary) hypertension: Secondary | ICD-10-CM | POA: Diagnosis not present

## 2015-07-01 DIAGNOSIS — J40 Bronchitis, not specified as acute or chronic: Secondary | ICD-10-CM | POA: Diagnosis not present

## 2015-07-01 DIAGNOSIS — Z23 Encounter for immunization: Secondary | ICD-10-CM | POA: Diagnosis not present

## 2015-07-05 ENCOUNTER — Telehealth: Payer: Self-pay

## 2015-07-05 NOTE — Telephone Encounter (Signed)
[phone]  PATIENT CALLED TO SCHEDULE HER FIRST SCREENING TCS  NO ISSUES

## 2015-07-06 ENCOUNTER — Emergency Department (HOSPITAL_COMMUNITY)
Admission: EM | Admit: 2015-07-06 | Discharge: 2015-07-06 | Disposition: A | Discharge status: Home / Self Care | Payer: Medicare Other | Attending: Emergency Medicine | Admitting: Emergency Medicine

## 2015-07-06 ENCOUNTER — Encounter (HOSPITAL_COMMUNITY): Payer: Self-pay | Admitting: *Deleted

## 2015-07-06 DIAGNOSIS — M1990 Unspecified osteoarthritis, unspecified site: Secondary | ICD-10-CM | POA: Diagnosis not present

## 2015-07-06 DIAGNOSIS — Z7952 Long term (current) use of systemic steroids: Secondary | ICD-10-CM | POA: Diagnosis not present

## 2015-07-06 DIAGNOSIS — Z791 Long term (current) use of non-steroidal anti-inflammatories (NSAID): Secondary | ICD-10-CM | POA: Diagnosis not present

## 2015-07-06 DIAGNOSIS — Z79899 Other long term (current) drug therapy: Secondary | ICD-10-CM | POA: Insufficient documentation

## 2015-07-06 DIAGNOSIS — K029 Dental caries, unspecified: Secondary | ICD-10-CM | POA: Insufficient documentation

## 2015-07-06 DIAGNOSIS — Z880 Allergy status to penicillin: Secondary | ICD-10-CM | POA: Insufficient documentation

## 2015-07-06 DIAGNOSIS — K047 Periapical abscess without sinus: Secondary | ICD-10-CM | POA: Diagnosis not present

## 2015-07-06 DIAGNOSIS — Z720 Tobacco use: Secondary | ICD-10-CM | POA: Diagnosis not present

## 2015-07-06 DIAGNOSIS — I10 Essential (primary) hypertension: Secondary | ICD-10-CM | POA: Insufficient documentation

## 2015-07-06 DIAGNOSIS — Z8669 Personal history of other diseases of the nervous system and sense organs: Secondary | ICD-10-CM | POA: Diagnosis not present

## 2015-07-06 DIAGNOSIS — Z792 Long term (current) use of antibiotics: Secondary | ICD-10-CM | POA: Diagnosis not present

## 2015-07-06 DIAGNOSIS — J45909 Unspecified asthma, uncomplicated: Secondary | ICD-10-CM | POA: Diagnosis not present

## 2015-07-06 DIAGNOSIS — K0889 Other specified disorders of teeth and supporting structures: Secondary | ICD-10-CM | POA: Diagnosis present

## 2015-07-06 MED ORDER — CLINDAMYCIN HCL 150 MG PO CAPS: 300.0000 mg | ORAL_CAPSULE | Freq: Three times a day (TID) | ORAL | Status: DC

## 2015-07-06 MED ORDER — HYDROCODONE-ACETAMINOPHEN 5-325 MG PO TABS
1.0000 | ORAL_TABLET | Freq: Once | ORAL | Status: AC
  Administered 2015-07-06: 1 via ORAL
  Filled 2015-07-06: qty 1

## 2015-07-06 NOTE — Discharge Instructions (Signed)
You must follow up with a dentist for your dental problems. We can not give you the treatment you need for your teeth here in the emergency department because we do not have a dentist here or on call. We are giving you a narcotic here today but you will need to contact your primary care doctor or the dentist for any additional narcotics.   Please call for follow up with a dentist.   Dental Abscess A dental abscess is a collection of pus in or around a tooth. CAUSES This condition is caused by a bacterial infection around the root of the tooth that involves the inner part of the tooth (pulp). It may result from:  Severe tooth decay.  Trauma to the tooth that allows bacteria to enter into the pulp, such as a broken or chipped tooth.  Severe gum disease around a tooth. SYMPTOMS Symptoms of this condition include:  Severe pain in and around the infected tooth.  Swelling and redness around the infected tooth, in the mouth, or in the face.  Tenderness.  Pus drainage.  Bad breath.  Bitter taste in the mouth.  Difficulty swallowing.  Difficulty opening the mouth.  Nausea.  Vomiting.  Chills.  Swollen neck glands.  Fever. DIAGNOSIS This condition is diagnosed with examination of the infected tooth. During the exam, your dentist may tap on the infected tooth. Your dentist will also ask about your medical and dental history and may order X-rays. TREATMENT This condition is treated by eliminating the infection. This may be done with:  Antibiotic medicine.  A root canal. This may be performed to save the tooth.  Pulling (extracting) the tooth. This may also involve draining the abscess. This is done if the tooth cannot be saved. HOME CARE INSTRUCTIONS  Take medicines only as directed by your dentist.  If you were prescribed antibiotic medicine, finish all of it even if you start to feel better.  Rinse your mouth (gargle) often with salt water to relieve pain or  swelling.  Do not drive or operate heavy machinery while taking pain medicine.  Do not apply heat to the outside of your mouth.  Keep all follow-up visits as directed by your dentist. This is important. SEEK MEDICAL CARE IF:  Your pain is worse and is not helped by medicine. SEEK IMMEDIATE MEDICAL CARE IF:  You have a fever or chills.  Your symptoms suddenly get worse.  You have a very bad headache.  You have problems breathing or swallowing.  You have trouble opening your mouth.  You have swelling in your neck or around your eye.   This information is not intended to replace advice given to you by your health care provider. Make sure you discuss any questions you have with your health care provider.   Document Released: 09/04/2005 Document Revised: 01/19/2015 Document Reviewed: 09/01/2014 Elsevier Interactive Patient Education 2016 [place] and Dentist Visits Dental care supports good overall health. Regular dental visits can also help you avoid dental pain, bleeding, infection, and other more serious health problems in the future. It is important to keep the mouth healthy because diseases in the teeth, gums, and other oral tissues can spread to other areas of the body. Some problems, such as diabetes, heart disease, and pre-term labor have been associated with poor oral health.  See your dentist every 6 months. If you experience emergency problems such as a toothache or broken tooth, go to the dentist right away. If you see  your dentist regularly, you may catch problems early. It is easier to be treated for problems in the early stages.  WHAT TO EXPECT AT A DENTIST VISIT  Your dentist will look for many common oral health problems and recommend proper treatment. At your regular dental visit, you can expect:  Gentle cleaning of the teeth and gums. This includes scraping and polishing. This helps to remove the sticky substance around the teeth and gums (plaque).  Plaque forms in the mouth shortly after eating. Over time, plaque hardens on the teeth as tartar. If tartar is not removed regularly, it can cause problems. Cleaning also helps remove stains.  Periodic X-rays. These pictures of the teeth and supporting bone will help your dentist assess the health of your teeth.  Periodic fluoride treatments. Fluoride is a natural mineral shown to help strengthen teeth. Fluoride treatmentinvolves applying a fluoride gel or varnish to the teeth. It is most commonly done in children.  Examination of the mouth, tongue, jaws, teeth, and gums to look for any oral health problems, such as:  Cavities (dental caries). This is decay on the tooth caused by plaque, sugar, and acid in the mouth. It is best to catch a cavity when it is small.  Inflammation of the gums caused by plaque buildup (gingivitis).  Problems with the mouth or malformed or misaligned teeth.  Oral cancer or other diseases of the soft tissues or jaws. KEEP YOUR TEETH AND GUMS HEALTHY For healthy teeth and gums, follow these general guidelines as well as your dentist's specific advice:  Have your teeth professionally cleaned at the dentist every 6 months.  Brush twice daily with a fluoride toothpaste.  Floss your teeth daily.  Ask your dentist if you need fluoride supplements, treatments, or fluoride toothpaste.  Eat a healthy diet. Reduce foods and drinks with added sugar.  Avoid smoking. TREATMENT FOR ORAL HEALTH PROBLEMS If you have oral health problems, treatment varies depending on the conditions present in your teeth and gums.  Your caregiver will most likely recommend good oral hygiene at each visit.  For cavities, gingivitis, or other oral health disease, your caregiver will perform a procedure to treat the problem. This is typically done at a separate appointment. Sometimes your caregiver will refer you to another dental specialist for specific tooth problems or for  surgery. SEEK IMMEDIATE DENTAL CARE IF:  You have pain, bleeding, or soreness in the gum, tooth, jaw, or mouth area.  A permanent tooth becomes loose or separated from the gum socket.  You experience a blow or injury to the mouth or jaw area.   This information is not intended to replace advice given to you by your health care provider. Make sure you discuss any questions you have with your health care provider.   Document Released: 05/17/2011 Document Revised: 11/27/2011 Document Reviewed: 05/17/2011 Elsevier Interactive Patient Education 2016 [place] Dental caries is tooth decay. This decay can cause a hole in teeth (cavity) that can get bigger and deeper over time. HOME CARE  Brush and floss your teeth. Do this at least two times a day.  Use a fluoride toothpaste.  Use a mouth rinse if told by your dentist or doctor.  Eat less sugary and starchy foods. Drink less sugary drinks.  Avoid snacking often on sugary and starchy foods. Avoid sipping often on sugary drinks.  Keep regular checkups and cleanings with your dentist.  Use fluoride supplements if told by your dentist or doctor.  Allow  fluoride to be applied to teeth if told by your dentist or doctor.   This information is not intended to replace advice given to you by your health care provider. Make sure you discuss any questions you have with your health care provider.   Document Released: 06/13/2008 Document Revised: 09/25/2014 Document Reviewed: 09/06/2012 Elsevier Interactive Patient Education [employer].

## 2015-07-06 NOTE — ED Notes (Signed)
Pt cracked a tooth to bottom left after chewing some gum on 10/2. States she is on waiting list with dentist per pt

## 2015-07-06 NOTE — ED Provider Notes (Signed)
CSN: [id number]     Arrival date & time 07/06/15  53 History   First MD Initiated Contact with Patient 07/06/15 1626     Chief Complaint  Patient presents with  . Dental Pain     (Consider location/radiation/quality/duration/timing/severity/associated sxs/prior Treatment) Patient is a 49 y.o. female presenting with tooth pain. The history is provided by the patient.  Dental Pain Location:  Lower Lower teeth location:  18/LL 2nd molar Quality:  Throbbing and constant Severity:  Severe Onset quality:  Gradual Duration:  2 days Timing:  Constant Progression:  Worsening Chronicity:  New Context: abscess and dental caries   Relieved by:  Nothing Worsened by:  Cold food/drink Ineffective treatments:  NSAIDs Associated symptoms: facial pain and facial swelling    Kirsten Walls is a 49 y.o. female who presents to the ED with dental pain. She reports chewing gum 06/20/15 and later noted one of her bottom teeth was cracked. She has called a Pharmacist, community and is on a waiting list.   Past Medical History  Diagnosis Date  . Hypertension   . Arthritis   . Bronchitis   . Asthma   . Seizures [facility])    Past Surgical History  Procedure Laterality Date  . Cesarean section  x2  . Bunionectomy  both big toe  . Abdominal hysterectomy    . Tumor removal  left knee cap  . Knee surgery    . Foot surgery     Family History  Problem Relation Age of Onset  . Asthma    . Hypertension Mother   . Hyperlipidemia Mother    Social History  Substance Use Topics  . Smoking status: Current Every Day Smoker -- 0.50 packs/day for 30 years    Types: Cigarettes  . Smokeless tobacco: Never Used  . Alcohol Use: No   OB History    Gravida Para Term Preterm AB TAB SAB Ectopic Multiple Living   4 4 4       4      Review of Systems  HENT: Positive for dental problem and facial swelling.   all other systems negataive    Allergies  Penicillins; Azithromycin; and  Sulfamethoxazole-trimethoprim  Home Medications   Prior to Admission medications   Medication Sig Start Date End Date Taking? Authorizing Provider  albuterol (VENTOLIN HFA) 108 (90 BASE) MCG/ACT inhaler Inhale 2 puffs into the lungs every 6 (six) hours as needed. For shortness of breath/asthma/bronchitis     Historical Provider, MD  ALPRAZolam Duanne Moron) 0.5 MG tablet Take 0.5 mg by mouth at bedtime as needed for anxiety.    Historical Provider, MD  amLODipine (NORVASC) 5 MG tablet Take 5 mg by mouth daily.    Historical Provider, MD  cephALEXin (KEFLEX) 500 MG capsule Take 1 capsule (500 mg total) by mouth 4 (four) times daily. 05/28/15   Fransico Meadow, PA-C  clindamycin (CLEOCIN) 150 MG capsule Take 2 capsules (300 mg total) by mouth 3 (three) times daily. 07/06/15   Dequon Schnebly Bunnie Pion, NP  methocarbamol (ROBAXIN) 500 MG tablet Take 500 mg by mouth every 6 (six) hours as needed.  02/02/15   Historical Provider, MD  naproxen (NAPROSYN) 500 MG tablet Take 1 tablet (500 mg total) by mouth 2 (two) times daily with a meal. As needed for pain 05/28/15   Fransico Meadow, PA-C  pantoprazole (PROTONIX) 40 MG tablet Take 40 mg by mouth daily.  02/11/15   Historical Provider, MD  permethrin (ELIMITE) 5 % cream Apply to affected  area once 05/28/15   Fransico Meadow, PA-C  tiZANidine (ZANAFLEX) 2 MG tablet Take 2 mg by mouth every 8 (eight) hours.    Historical Provider, MD  topiramate (TOPAMAX) 25 MG tablet Take 100 mg by mouth daily.    Historical Provider, MD  triamcinolone ointment (KENALOG) 0.5 % Apply 1 application topically 2 (two) times daily. 05/28/15   Fransico Meadow, PA-C   BP 116/73 mmHg  Pulse 69  Temp(Src) 98.3 F (36.8 C) (Oral)  Resp 18  Ht 5\' 3"  (1.6 m)  Wt 140 lb (63.504 kg)  BMI 24.81 kg/m2  SpO2 99% Physical Exam  Constitutional: She is oriented to person, place, and time. She appears well-developed and well-nourished. No distress.  HENT:  Head: Normocephalic.  Nose: Nose normal.  Mouth/Throat:  Uvula is midline, oropharynx is clear and moist and mucous membranes are normal.    Broken and decayed second molar left lower. Gum swelling around the tooth with erythema and tender on exam.   Eyes: EOM are normal.  Neck: Normal range of motion. Neck supple.  Cardiovascular: Normal rate and regular rhythm.   Pulmonary/Chest: Effort normal and breath sounds normal.  Musculoskeletal: Normal range of motion.  Neurological: She is alert and oriented to person, place, and time. No cranial nerve deficit.  Skin: Skin is warm and dry.  Psychiatric: She has a normal mood and affect. Her behavior is normal.  Nursing note and vitals reviewed.   ED Course  Procedures (including critical care time) Labs Review  MDM  49 y.o. female with chronic dental problems stable for d/c without fever and does not appear toxic. Patient given one hydrocodone 5/325 mg. Here in the ED and instructed to take the antibiotics and ibuprofen and follow up with the dentist. If she requires additional narcotics she is to contact her PCP or the dentist.   Final diagnoses:  Dental abscess  Dental caries       [facility], NP 07/06/15 1752  Milton Ferguson, MD 07/06/15 2354

## 2015-07-07 NOTE — Telephone Encounter (Signed)
OV with Neil Crouch, PA on 07/27/2015 at 10:30 due to med list.

## 2015-07-07 NOTE — Addendum Note (Signed)
Addended by: Everardo All on: 07/07/2015 11:20 AM   Modules accepted: Medications

## 2015-07-15 ENCOUNTER — Ambulatory Visit (HOSPITAL_COMMUNITY)
Admission: RE | Admit: 2015-07-15 | Discharge: 2015-07-15 | Disposition: A | Discharge status: Home / Self Care | Payer: Medicare Other | Source: Ambulatory Visit | Attending: Internal Medicine | Admitting: Internal Medicine

## 2015-07-15 DIAGNOSIS — Z1231 Encounter for screening mammogram for malignant neoplasm of breast: Secondary | ICD-10-CM | POA: Diagnosis not present

## 2015-07-22 ENCOUNTER — Emergency Department (HOSPITAL_COMMUNITY): Payer: Medicare Other

## 2015-07-22 ENCOUNTER — Emergency Department (HOSPITAL_COMMUNITY)
Admission: EM | Admit: 2015-07-22 | Discharge: 2015-07-22 | Disposition: A | Discharge status: Home / Self Care | Payer: Medicare Other | Attending: Emergency Medicine | Admitting: Emergency Medicine

## 2015-07-22 ENCOUNTER — Encounter (HOSPITAL_COMMUNITY): Payer: Self-pay

## 2015-07-22 DIAGNOSIS — I10 Essential (primary) hypertension: Secondary | ICD-10-CM | POA: Diagnosis not present

## 2015-07-22 DIAGNOSIS — J069 Acute upper respiratory infection, unspecified: Secondary | ICD-10-CM

## 2015-07-22 DIAGNOSIS — Z79899 Other long term (current) drug therapy: Secondary | ICD-10-CM | POA: Insufficient documentation

## 2015-07-22 DIAGNOSIS — Z880 Allergy status to penicillin: Secondary | ICD-10-CM | POA: Insufficient documentation

## 2015-07-22 DIAGNOSIS — Z791 Long term (current) use of non-steroidal anti-inflammatories (NSAID): Secondary | ICD-10-CM | POA: Insufficient documentation

## 2015-07-22 DIAGNOSIS — M1990 Unspecified osteoarthritis, unspecified site: Secondary | ICD-10-CM | POA: Insufficient documentation

## 2015-07-22 DIAGNOSIS — J45909 Unspecified asthma, uncomplicated: Secondary | ICD-10-CM | POA: Insufficient documentation

## 2015-07-22 DIAGNOSIS — G8929 Other chronic pain: Secondary | ICD-10-CM | POA: Diagnosis not present

## 2015-07-22 DIAGNOSIS — Z792 Long term (current) use of antibiotics: Secondary | ICD-10-CM | POA: Diagnosis not present

## 2015-07-22 DIAGNOSIS — Z7952 Long term (current) use of systemic steroids: Secondary | ICD-10-CM | POA: Insufficient documentation

## 2015-07-22 DIAGNOSIS — B349 Viral infection, unspecified: Secondary | ICD-10-CM | POA: Diagnosis not present

## 2015-07-22 DIAGNOSIS — G40909 Epilepsy, unspecified, not intractable, without status epilepticus: Secondary | ICD-10-CM | POA: Insufficient documentation

## 2015-07-22 DIAGNOSIS — R05 Cough: Secondary | ICD-10-CM | POA: Diagnosis not present

## 2015-07-22 DIAGNOSIS — Z720 Tobacco use: Secondary | ICD-10-CM | POA: Diagnosis not present

## 2015-07-22 DIAGNOSIS — R197 Diarrhea, unspecified: Secondary | ICD-10-CM | POA: Diagnosis not present

## 2015-07-22 DIAGNOSIS — F17210 Nicotine dependence, cigarettes, uncomplicated: Secondary | ICD-10-CM | POA: Diagnosis not present

## 2015-07-22 HISTORY — DX: Pain in unspecified shoulder: M25.519

## 2015-07-22 HISTORY — DX: Other chronic pain: G89.29

## 2015-07-22 LAB — RAPID STREP SCREEN (MED CTR MEBANE ONLY): STREPTOCOCCUS, GROUP A SCREEN (DIRECT): NEGATIVE

## 2015-07-22 MED ORDER — BENZONATATE 100 MG PO CAPS: 100.0000 mg | ORAL_CAPSULE | Freq: Three times a day (TID) | ORAL | Status: DC | PRN

## 2015-07-22 NOTE — ED Notes (Signed)
Patient with no complaints at this time. Respirations even and unlabored. Skin warm/dry. Discharge instructions reviewed with patient at this time. Patient given opportunity to voice concerns/ask questions. Patient discharged at this time and left Emergency Department with steady gait.   

## 2015-07-22 NOTE — ED Provider Notes (Signed)
CSN: [id number]     Arrival date & time 07/22/15  8299 History   First MD Initiated Contact with Patient 07/22/15 0732     Chief Complaint  Patient presents with  . Cough     HPI Pt was seen at 0730.  Per pt, c/o gradual onset and persistence of constant sore throat, runny/stuffy nose, sinus congestion, cough, generalized body aches, and a few episodes of "diarrhea" for the past 2 days.  Denies fevers, no rash, no CP/SOB, no N/V, no abd pain, no black or blood in stools.     Past Medical History  Diagnosis Date  . Hypertension   . Arthritis   . Bronchitis   . Asthma   . Seizures ([place])   . Chronic shoulder pain    Past Surgical History  Procedure Laterality Date  . Cesarean section  x2  . Bunionectomy  both big toe  . Abdominal hysterectomy    . Tumor removal  left knee cap  . Knee surgery    . Foot surgery     Family History  Problem Relation Age of Onset  . Asthma    . Hypertension Mother   . Hyperlipidemia Mother    Social History  Substance Use Topics  . Smoking status: Current Every Day Smoker -- 0.50 packs/day for 30 years    Types: Cigarettes  . Smokeless tobacco: Never Used  . Alcohol Use: No   OB History    Gravida Para Term Preterm AB TAB SAB Ectopic Multiple Living   4 4 4       4      Review of Systems ROS: Statement: All systems negative except as marked or noted in the HPI; Constitutional: Negative for fever and chills. ; ; Eyes: Negative for eye pain, redness and discharge. ; ; ENMT: Negative for ear pain, hoarseness, +nasal congestion, sinus pressure and sore throat. ; ; Cardiovascular: Negative for chest pain, palpitations, diaphoresis, dyspnea and peripheral edema. ; ; Respiratory: +cough. Negative for wheezing and stridor. ; ; Gastrointestinal: +diarrhea. Negative for nausea, vomiting, abdominal pain, blood in stool, hematemesis, jaundice and rectal bleeding. . ; ; Genitourinary: Negative for dysuria, flank pain and hematuria. ; ; Musculoskeletal:  Negative for back pain and neck pain. Negative for swelling and trauma.; ; Skin: Negative for pruritus, rash, abrasions, blisters, bruising and skin lesion.; ; Neuro: Negative for headache, lightheadedness and neck stiffness. Negative for weakness, altered level of consciousness , altered mental status, extremity weakness, paresthesias, involuntary movement, seizure and syncope.      Allergies  Penicillins; Azithromycin; and Sulfamethoxazole-trimethoprim  Home Medications   Prior to Admission medications   Medication Sig Start Date End Date Taking? Authorizing Provider  albuterol (VENTOLIN HFA) 108 (90 BASE) MCG/ACT inhaler Inhale 2 puffs into the lungs every 6 (six) hours as needed. For shortness of breath/asthma/bronchitis     Historical Provider, MD  ALPRAZolam Duanne Moron) 0.5 MG tablet Take 0.5 mg by mouth at bedtime as needed for anxiety.    Historical Provider, MD  amLODipine (NORVASC) 5 MG tablet Take 5 mg by mouth daily.    Historical Provider, MD  benzonatate (TESSALON) 100 MG capsule Take 1 capsule (100 mg total) by mouth 3 (three) times daily as needed for cough. 07/22/15   Francine Graven, DO  cephALEXin (KEFLEX) 500 MG capsule Take 1 capsule (500 mg total) by mouth 4 (four) times daily. Patient not taking: Reported on 07/07/2015 05/28/15   Fransico Meadow, PA-C  clindamycin (CLEOCIN) 150 MG capsule  Take 2 capsules (300 mg total) by mouth 3 (three) times daily. 07/06/15   Hope Bunnie Pion, NP  ibuprofen (ADVIL,MOTRIN) 800 MG tablet Take 800 mg by mouth daily.    Historical Provider, MD  meloxicam (MOBIC) 15 MG tablet Take 15 mg by mouth 2 (two) times daily.    Historical Provider, MD  methocarbamol (ROBAXIN) 500 MG tablet Take 500 mg by mouth every 6 (six) hours as needed.  02/02/15   Historical Provider, MD  naproxen (NAPROSYN) 500 MG tablet Take 1 tablet (500 mg total) by mouth 2 (two) times daily with a meal. As needed for pain Patient not taking: Reported on 07/07/2015 05/28/15   Fransico Meadow, PA-C  pantoprazole (PROTONIX) 40 MG tablet Take 40 mg by mouth daily.  02/11/15   Historical Provider, MD  permethrin (ELIMITE) 5 % cream Apply to affected area once Patient not taking: Reported on 07/07/2015 05/28/15   Fransico Meadow, PA-C  tiZANidine (ZANAFLEX) 2 MG tablet Take 2 mg by mouth every 8 (eight) hours.    Historical Provider, MD  topiramate (TOPAMAX) 25 MG tablet Take 100 mg by mouth daily.    Historical Provider, MD  triamcinolone ointment (KENALOG) 0.5 % Apply 1 application topically 2 (two) times daily. 05/28/15   Fransico Meadow, PA-C   BP 128/86 mmHg  Pulse 73  Temp(Src) 98.2 F (36.8 C) (Oral)  Resp 17  Ht 5\' 3"  (1.6 m)  Wt 140 lb (63.504 kg)  BMI 24.81 kg/m2  SpO2 99% Physical Exam  0735: Physical examination:  Nursing notes reviewed; Vital signs and O2 SAT reviewed;  Constitutional: Well developed, Well nourished, Well hydrated, In no acute distress; Head:  Normocephalic, atraumatic; Eyes: EOMI, PERRL, No scleral icterus; ENMT: TM's clear bilat. +edemetous nasal turbinates bilat with clear rhinorrhea. Mouth and pharynx without lesions. No tonsillar exudates. No intra-oral edema. No submandibular or sublingual edema. No hoarse voice, no drooling, no stridor. No pain with manipulation of larynx. No trismus. Mouth and pharynx normal, Mucous membranes moist; Neck: Supple, Full range of motion, No lymphadenopathy; Cardiovascular: Regular rate and rhythm, No murmur, rub, or gallop; Respiratory: Breath sounds clear & equal bilaterally, No rales, rhonchi, wheezes.  Speaking full sentences with ease, Normal respiratory effort/excursion; Chest: Nontender, Movement normal; Abdomen: Soft, Nontender, Nondistended, Normal bowel sounds; Genitourinary: No CVA tenderness; Extremities: Pulses normal, No tenderness, No edema, No calf edema or asymmetry.; Neuro: AA&Ox3, Major CN grossly intact.  Speech clear. No gross focal motor or sensory deficits in extremities. Climbs on and off stretcher  easily by herself. Gait steady.; Skin: Color normal, Warm, Dry.   ED Course  Procedures (including critical care time)  Labs Review  Imaging Review  I have personally reviewed and evaluated these images and lab results as part of my medical decision-making.   EKG Interpretation None      MDM  MDM Reviewed: previous chart, nursing note and vitals Interpretation: labs and x-ray     Results for orders placed or performed during the hospital encounter of 07/22/15  Rapid strep screen  Result Value Ref Range   Streptococcus, Group A Screen (Direct) NEGATIVE NEGATIVE   Dg Chest 2 View 07/22/2015  CLINICAL DATA:  Cough and wheezing for 2 days EXAM: CHEST  2 VIEW COMPARISON:  September 09, 2012 FINDINGS: Lungs are clear. Heart size and pulmonary vascularity are normal. No adenopathy. No pneumothorax. No bone lesions. IMPRESSION: No abnormality noted. Electronically Signed   By: Lowella Grip III M.D.  On: 07/22/2015 08:00    0855:  Workup reassuring. Tx symptomatically at this time. Dx and testing d/w pt. Questions answered.  Verb understanding, agreeable to d/c home with outpt f/u.     Francine Graven, DO 07/25/15 2043

## 2015-07-22 NOTE — Discharge Instructions (Signed)
°Emergency Department Resource Guide °1) Find a Doctor and Pay Out of Pocket °Although you won't have to find out who is covered by your insurance plan, it is a good idea to ask around and get recommendations. You will then need to call the office and see if the doctor you have chosen will accept you as a new patient and what types of options they offer for patients who are self-pay. Some doctors offer discounts or will set up payment plans for their patients who do not have insurance, but you will need to ask so you aren't surprised when you get to your appointment. ° °2) Contact Your Local Health Department °Not all health departments have doctors that can see patients for sick visits, but many do, so it is worth a call to see if yours does. If you don't know where your local health department is, you can check in your phone book. The CDC also has a tool to help you locate your state's health department, and many state websites also have listings of all of their local health departments. ° °3) Find a Walk-in Clinic °If your illness is not likely to be very severe or complicated, you may want to try a walk in clinic. These are popping up all over the country in pharmacies, drugstores, and shopping centers. They're usually staffed by nurse practitioners or physician assistants that have been trained to treat common illnesses and complaints. They're usually fairly quick and inexpensive. However, if you have serious medical issues or chronic medical problems, these are probably not your best option. ° °No Primary Care Doctor: °- Call [facility] at  832-8000 - they can help you locate a primary care doctor that  accepts your insurance, provides certain services, etc. °- Physician Referral Service- 1-800-533-3463 ° °Chronic Pain Problems: °Organization         Address  Phone   Notes  °[facility]  (336) [phone] Patients need to be referred by their primary care doctor.  ° °Medication  Assistance: °Organization         Address  Phone   Notes  °[place] [facility] [address] °[place], [place] [address] (336) 641-8030 --Must be a resident of [place] °-- Must have NO insurance coverage whatsoever (no Medicaid/ Medicare, etc.) °-- The pt. MUST have a primary care doctor that directs their care regularly and follows them in the community °  °[facility]  (866) 331-1348   °[employer]  (888) 892-1162   ° °Agencies that provide inexpensive medical care: °Organization         Address  Phone   Notes  °[facility]  (336) 832-8035   °[facility]    (336) 832-7272   °[facility] [address] °[place], [place] [address] (336) 832-4777   °[facility] [address], °[place] (336) 271-4999   °[facility]    (336) 373-0678   °[facility]    (336) 272-1050   °[facility] ° [address], [place] Phone:  (336) 832-4444, Fax:  (336) 832-4440 Hours of Operation:  9 am - 6 pm, M-F.  Also accepts Medicaid/Medicare and self-pay.  °[facility] ° [address], [place] Phone: (336) 832-3150, Fax: (336) [phone]. Hours of Operation:  8:30 am - 5:30 pm, M-F.  Also accepts Medicaid and self-pay.  °[facility] [place] [address], [place] Phone: (336) 878-6027   °[facility] [address], [place], [place] (336)723-1848, Ext. 123 Mondays & Thursdays: 7-9 AM.  First 15 patients are seen on a first come, first serve basis. °  ° °Medicaid-accepting [place] Providers: ° °Organization         Address  Phone   Notes  °[facility] [address], [place] (336) 641-2100 Also accepts self-pay patients.  °[facility] [address], [place] ° (336) 856-9996   °[facility] [address], [place]  (336) 288-8857   °[facility] [address] [place] [address], [place] (336) 299-7000   °Veita Bland [address], [place]  ° (336) 373-1557 Only accepts [place] Access Medicaid patients after they have their name applied to their card.  ° °Self-Pay (no insurance) in [place]: ° °Organization         Address  Phone   Notes  °Sickle Cell Patients, [facility] [address], [place] (336) 832-1970   °[facility] [address], [place] (336) 832-4400   °[facility] ° [address], [place] (336) 992-4800   °[facility]/Dr. Osei-Bonsu ° [address] [place] [address], [place] or [address], [place] (336) [phone] Phone number for both [place] and [place] locations is the same.  °[facility] [address], [place] (336) 299-0000   °[facility] [address] [place] [address], [place] or [address] (336) 852-7530 °(336) 878-2260   °[facility] [address], [place] (336) 350-1642, phone; (336) 294-5005, fax Sees patients 1st and 3rd Saturday of every month.  Must not qualify for public or private insurance (i.e. Medicaid, Medicare, [place] Health Choice, Veterans' Benefits) • Household income should be no more than 200% of the poverty level •The clinic cannot treat you if you are pregnant or think you are pregnant • Sexually transmitted diseases are not treated at the clinic.  ° ° °Dental Care: °Organization         Address  Phone  Notes  °[place] [facility] [address],  (336) 641-6152 Accepts children up to age 21 who are enrolled in Medicaid or [place] Health Choice; pregnant women with a Medicaid card; and children who have applied for Medicaid or [place] Health Choice, but were declined, whose parents can pay a reduced fee at time of service.  °[place]  [facility] [place]  [address], [place] (336) 641-7733 Accepts children up to age 21 who are enrolled in Medicaid or [place] Health Choice; pregnant women with a Medicaid card; and children who have applied for Medicaid or [place] Health Choice, but were declined, whose parents can pay a reduced fee at time of service.  °[facility] ° [address],  (336) 641-4533 Patients are seen by appointment only. Walk-ins are not accepted. [facility] will see patients 18 years of age and older. °Monday - Tuesday (8am-5pm) °Most Wednesdays (8:30-5pm) °$30 per visit, cash only  °[facility] ° [address], [place] (336) 641-4533 Patients are seen by appointment only. Walk-ins are not accepted. [facility] will see patients 18 years of age and older. °One   Wednesday Evening (Monthly: Volunteer Based).  $30 per visit, cash only  °[facility]  (919) 537-3737 for adults; Children under age 4, call Graduate Pediatric Dentistry at ([phone]) [phone]. Children aged 4-14, please call ([phone]) [phone] to request a pediatric application. ° Dental services are provided in all areas of dental care including fillings, crowns and bridges, complete and partial dentures, implants, gum treatment, root canals, and extractions. Preventive care is also provided. Treatment is provided to both adults and children. °Patients are selected via a lottery and there is often a waiting list. °  °[facility] [address], °[place] ° (336) 763-8833 [email or web address] °  °[facility] [address], [place], [place] (336)723-1848, Ext. 123 Second and Fourth Thursday of each month, opens at 6:30 AM; Clinic ends at 9 AM.  Patients are seen on a first-come first-served basis, and a limited number are seen during each clinic.  ° °[facility] ° [address], [place], [place] (336) 723-7904    Eligibility Requirements °You must have lived in [place], [place], or [place] counties for at least the last three months. °  You cannot be eligible for state or federal sponsored healthcare insurance, including Veterans Administration, Medicaid, or Medicare. °  You generally cannot be eligible for healthcare insurance through your employer.  °  How to apply: °Eligibility screenings are held every Tuesday and Wednesday afternoon from 1:00 pm until 4:00 pm. You do not need an appointment for the interview!  °[facility] [address], [place], [place] 336-631-2330   °[facility]  336-342-8273   °[facility]  336-703-3100   °[facility]  336-570-6415   ° °Behavioral Health Resources in the Community: °Intensive Outpatient Programs °Organization         Address  Phone  Notes  °[facility] [address], [place], [place] 336-878-6098   °[facility] [address], [place], [place] 336-832-9800   °[facility] [address], [place], [place] ° 336-882-2125   °[facility] [address],  °[place], [place] 1-800-853-5163 or 336-641-4981   °Substance Abuse Resources °Organization         Address  Phone  Notes  °[facility]  336-882-2125   °[facility]  336-784-9470   °[facility]  336-285-9073   °[facility]  336-845-3988   °Residential & Outpatient Substance Abuse Program  1-800-659-3381   °Psychological Services °Organization         Address  Phone  Notes  °[facility]  336- 832-9600   °[facility]  336- 378-7881   °[facility] [address], [place] 1-800-853-5163 or 336-641-4981   ° °Mobile Crisis Teams °Organization         Address  Phone  Notes  °[facility], Mobile Crisis Care Unit  1-877-626-1772   °Assertive °[facility] ° [address]  [place], [place] 336-834-9664   °Sharon DeEsch [address] °[place] 336-554-5454   ° °Self-Help/Support Groups °Organization         Address  Phone             Notes  °[facility] [place] - variety of support groups  336- 373-1402 Call for more information  °Narcotics Anonymous (NA), [facility] [address], °[place]  2 meetings at this location  ° °  Residential Treatment Programs °Organization         Address  Phone  Notes  °[facility] [address],    °[place]  1-866-801-8205   °[facility] ° [address], [place], [place] 704-293-8524   °[facility] [address], [place] 336-845-3988 Admissions: 8am-3pm M-F  °[facility] [address]    °[place], [place] 336-841-1104   °[facility] [address], [place], [place] 336-379-7146   °[facility] [address]  °[place], [place] 336-285-9073   °[facility] - Intensive Outpatient [address], [place], [place] 336-852-3033   °[facility] [address]  °[place], [place] 1-877-615-2722 or 336-784-9470   °[facility] [address] [place], [place] 336-227-7417 Accepts Medicaid  °[facility] [address]  °[place] 1-800-659-3381 Substance Abuse/Addiction Treatment  ° °[place] Behavioral Health Resources °Organization         Address  Phone  Notes  °[facility]  (888) 581-9988   °Julie Brannon, PhD [address] [place], [place]   (336) 349-5553 or (336) 951-0000   °[facility]   [address] °[place], [place] (336) 349-4454   °[facility] [address], [place], [place] (336) 342-8316 Insurance/Medicaid/sponsorship through [facility]  °[facility] [address]                                    [place], [place] (336) 342-8316 Therapy/tele-psych/case    °[facility] [address]  ° [place], [place] (336) 349-2233    °Dr. Arfeen  (336) 349-4544   °[facility] [place]  [employer] [place] [facility] 1) [address], [place] °2) [address], [place] °3)  [address], [place] (336) 349-3220 °(336) 342-7768 ° °(336) 342-8140   °[place] Child [facility] (336) 342-1394 or (336) 342-3537 (After Hours)    ° ° °Take over the counter decongestant (such as sudafed), as directed on packaging, for the next week.  Use over the counter normal saline nasal spray, as instructed in the Emergency Department, several times per day for the next 2 weeks.  Take over the counter tylenol and ibuprofen, as directed on packaging, as needed for discomfort.  Gargle with warm water several times per day to help with discomfort.  May also use over the counter sore throat pain medicines such as chloraseptic or sucrets, as directed on packaging, as needed for discomfort.  Call your regular medical doctor today to schedule a follow up appointment this week.  Return to the Emergency Department immediately if worsening. ° ° °

## 2015-07-22 NOTE — ED Notes (Signed)
Pt reports cough, congestion, runny nose, body aches, and diarrhea x 2 days.

## 2015-07-24 LAB — CULTURE, GROUP A STREP

## 2015-07-27 ENCOUNTER — Other Ambulatory Visit: Payer: Self-pay

## 2015-07-27 ENCOUNTER — Encounter: Payer: Self-pay | Admitting: Gastroenterology

## 2015-07-27 ENCOUNTER — Ambulatory Visit (INDEPENDENT_AMBULATORY_CARE_PROVIDER_SITE_OTHER): Payer: Medicare Other | Admitting: Gastroenterology

## 2015-07-27 VITALS — BP 102/72 | HR 58 | Temp 97.5°F | Ht 63.0 in | Wt 131.6 lb

## 2015-07-27 DIAGNOSIS — Z79899 Other long term (current) drug therapy: Secondary | ICD-10-CM

## 2015-07-27 DIAGNOSIS — Z1211 Encounter for screening for malignant neoplasm of colon: Secondary | ICD-10-CM

## 2015-07-27 MED ORDER — PEG 3350-KCL-NA BICARB-NACL 420 G PO SOLR: 4000.0000 mL | ORAL | Status: DC

## 2015-07-27 NOTE — Assessment & Plan Note (Signed)
49 y/o female who presents to schedule first ever screening colonoscopy. Clinically doing well from GI standpoint. No FH colon cancer.  I have discussed the risks, alternatives, benefits with regards to but not limited to the risk of reaction to medication, bleeding, infection, perforation and the patient is agreeable to proceed. Written consent to be obtained.  Given h/o nausea with anesthesia and use of Xanax, we will augment conscious sedation with Phenergan 25mg  IV 30 minutes before procedure.

## 2015-07-27 NOTE — Progress Notes (Signed)
cc'ed to pcp °

## 2015-07-27 NOTE — Patient Instructions (Signed)
1. Colonoscopy as scheduled. See separate instructions.  

## 2015-07-27 NOTE — Progress Notes (Signed)
Primary Care Physician:  Rosita Fire, MD  Primary Gastroenterologist:  Garfield Cornea, MD   Chief Complaint  Patient presents with  . set up TCS    HPI:  Kirsten Walls is a 49 y.o. female here to schedule first ever screening colonoscopy. Due to polypharmacy she was asked office visit. She is doing well. Denies any blood in the stool or melena. No constipation or diarrhea. Denies abdominal pain, heartburn, unintentional weight loss, dysphagia. No family history of colon cancer.    Current Outpatient Prescriptions  Medication Sig Dispense Refill  . albuterol (VENTOLIN HFA) 108 (90 BASE) MCG/ACT inhaler Inhale 2 puffs into the lungs every 6 (six) hours as needed. For shortness of breath/asthma/bronchitis     . ALPRAZolam (XANAX) 0.5 MG tablet Take 0.5 mg by mouth at bedtime as needed for anxiety.    Marland Kitchen amLODipine (NORVASC) 5 MG tablet Take 5 mg by mouth daily.    . benzonatate (TESSALON) 100 MG capsule Take 1 capsule (100 mg total) by mouth 3 (three) times daily as needed for cough. 15 capsule 0  . clindamycin (CLEOCIN) 150 MG capsule Take 2 capsules (300 mg total) by mouth 3 (three) times daily. 42 capsule 0  . ibuprofen (ADVIL,MOTRIN) 800 MG tablet Take 800 mg by mouth daily.    . meloxicam (MOBIC) 15 MG tablet Take 15 mg by mouth 2 (two) times daily.    . methocarbamol (ROBAXIN) 500 MG tablet Take 500 mg by mouth every 6 (six) hours as needed.     . naproxen (NAPROSYN) 500 MG tablet Take 1 tablet (500 mg total) by mouth 2 (two) times daily with a meal. As needed for pain 20 tablet 0  . pantoprazole (PROTONIX) 40 MG tablet Take 40 mg by mouth daily.     . permethrin (ELIMITE) 5 % cream Apply to affected area once 60 g 0  . tiZANidine (ZANAFLEX) 2 MG tablet Take 2 mg by mouth every 8 (eight) hours.    . topiramate (TOPAMAX) 25 MG tablet Take 100 mg by mouth daily.    Marland Kitchen triamcinolone ointment (KENALOG) 0.5 % Apply 1 application topically 2 (two) times daily. 30 g 0   No current  facility-administered medications for this visit.    Allergies as of 07/27/2015 - Review Complete 07/27/2015  Allergen Reaction Noted  . Penicillins Hives   . Azithromycin Rash 10/24/2013  . Sulfamethoxazole-trimethoprim Hives and Rash 03/16/2014    Past Medical History  Diagnosis Date  . Hypertension   . Arthritis   . Bronchitis   . Asthma   . Seizures ([place])   . Chronic shoulder pain     Past Surgical History  Procedure Laterality Date  . Cesarean section  x2  . Bunionectomy  both big toe  . Abdominal hysterectomy    . Tumor removal  left knee cap  . Knee surgery    . Foot surgery    . Shoulder arthroscopy      Family History  Problem Relation Age of Onset  . Asthma    . Hypertension Mother   . Hyperlipidemia Mother   . Colon cancer Neg Hx     Social History   Social History  . Marital Status: Single    Spouse Name: N/A  . Number of Children: 3  . Years of Education: N/A   Occupational History  . Not on file.   Social History Main Topics  . Smoking status: Current Every Day Smoker -- 0.50 packs/day for 30 years  Types: Cigarettes  . Smokeless tobacco: Never Used  . Alcohol Use: No  . Drug Use: No  . Sexual Activity: Not Currently    Birth Control/ Protection: Surgical   Other Topics Concern  . Not on file   Social History Narrative      ROS:  General: Negative for anorexia, weight loss, fever, chills, fatigue, weakness. Eyes: Negative for vision changes.  ENT: Negative for hoarseness, difficulty swallowing , nasal congestion. CV: Negative for chest pain, angina, palpitations, dyspnea on exertion, peripheral edema.  Respiratory: Negative for dyspnea at rest, dyspnea on exertion, cough, sputum, wheezing.  GI: See history of present illness. GU:  Negative for dysuria, hematuria, urinary incontinence, urinary frequency, nocturnal urination.  MS: Negative for joint pain, low back pain.  Derm: Negative for rash or itching.  Neuro: Negative for  weakness, abnormal sensation, seizure, frequent headaches, memory loss, confusion.  Psych: Negative for anxiety, depression, suicidal ideation, hallucinations.  Endo: Negative for unusual weight change.  Heme: Negative for bruising or bleeding. Allergy: Negative for rash or hives.    Physical Examination:  BP 102/72 mmHg  Pulse 58  Temp(Src) 97.5 F (36.4 C)  Ht 5\' 3"  (1.6 m)  Wt 131 lb 9.6 oz (59.693 kg)  BMI 23.32 kg/m2   General: Well-nourished, well-developed in no acute distress.  Head: Normocephalic, atraumatic.   Eyes: Conjunctiva pink, no icterus. Mouth: Oropharyngeal mucosa moist and pink , no lesions erythema or exudate. Neck: Supple without thyromegaly, masses, or lymphadenopathy.  Lungs: Clear to auscultation bilaterally.  Heart: Regular rate and rhythm, no murmurs rubs or gallops.  Abdomen: Bowel sounds are normal, nontender, nondistended, no hepatosplenomegaly or masses, no abdominal bruits or    hernia , no rebound or guarding.   Rectal: Not performed Extremities: No lower extremity edema. No clubbing or deformities.  Neuro: Alert and oriented x 4 , grossly normal neurologically.  Skin: Warm and dry, no rash or jaundice.   Psych: Alert and cooperative, normal mood and affect.  Labs: Labs May 2016 Hemoglobin A1c 5.2, BUN 13, creatinine 1.0, glucose 56, total bilirubin 0.3, alkaline phosphatase 84, AST 16, ALT 20, albumin 4.5, white blood cell count 6900, hemoglobin 13.7, hematocrit 41.9, platelets 273,000  Imaging Studies: Dg Chest 2 View  07/22/2015  CLINICAL DATA:  Cough and wheezing for 2 days EXAM: CHEST  2 VIEW COMPARISON:  September 09, 2012 FINDINGS: Lungs are clear. Heart size and pulmonary vascularity are normal. No adenopathy. No pneumothorax. No bone lesions. IMPRESSION: No abnormality noted. Electronically Signed   By: Lowella Grip III M.D.   On: 07/22/2015 08:00   Mm Digital Screening Bilateral  07/19/2015  CLINICAL DATA:  Screening. EXAM:  DIGITAL SCREENING BILATERAL MAMMOGRAM WITH CAD COMPARISON:  Previous exam(s). ACR Breast Density Category b: There are scattered areas of fibroglandular density. FINDINGS: There are no findings suspicious for malignancy. Images were processed with CAD. IMPRESSION: No mammographic evidence of malignancy. A result letter of this screening mammogram will be mailed directly to the patient. RECOMMENDATION: Screening mammogram in one year. (Code:SM-B-01Y) BI-RADS CATEGORY  1: Negative. Electronically Signed   By: Margarette Canada M.D.   On: 07/19/2015 11:47

## 2015-08-03 DIAGNOSIS — L309 Dermatitis, unspecified: Secondary | ICD-10-CM | POA: Diagnosis not present

## 2015-08-17 ENCOUNTER — Telehealth: Payer: Self-pay | Admitting: Gastroenterology

## 2015-08-17 NOTE — Telephone Encounter (Signed)
Pt picked up prep kit and is aware of instructions

## 2015-08-17 NOTE — Telephone Encounter (Signed)
Patient was confused with prep and drank 1/2 yesterday. Melanie called from endo.   Here is the plan:  1. Go ahead and take 1/2 dose of prep this afternoon as on instructions. 2. Needs to come by and pick up an additional prep kit to take the other half tomorrow morning.

## 2015-08-17 NOTE — OR Nursing (Signed)
Called patient to verify patient understood her instructions for colonoscopy. Patient stated she took half of her prep yesterday and was going to take the other half today. Patient asked what she was supposed to do with the dulcolax tablets. I called office and spoke with Laban Emperor NP who said patient needed to get another prep so that she could do the split dosing. Patient called and informed to go to the office now to pick up another prep and to take the dulcolax tablets at 2pm today and then to drink half of the prep today and take the other half in the morning per her original instructions. Patient informed to take the enema at 0800 prior to her arrival. Patient verbalized understanding.

## 2015-08-18 ENCOUNTER — Encounter (HOSPITAL_COMMUNITY): Payer: Self-pay

## 2015-08-18 ENCOUNTER — Encounter (HOSPITAL_COMMUNITY)
Admission: RE | Disposition: A | Discharge status: Home / Self Care | Payer: Self-pay | Source: Ambulatory Visit | Attending: Internal Medicine

## 2015-08-18 ENCOUNTER — Ambulatory Visit (HOSPITAL_COMMUNITY)
Admission: RE | Admit: 2015-08-18 | Discharge: 2015-08-18 | Disposition: A | Discharge status: Home / Self Care | Payer: Medicare Other | Source: Ambulatory Visit | Attending: Internal Medicine | Admitting: Internal Medicine

## 2015-08-18 DIAGNOSIS — Z1211 Encounter for screening for malignant neoplasm of colon: Secondary | ICD-10-CM | POA: Diagnosis not present

## 2015-08-18 DIAGNOSIS — Z882 Allergy status to sulfonamides status: Secondary | ICD-10-CM | POA: Diagnosis not present

## 2015-08-18 DIAGNOSIS — M25519 Pain in unspecified shoulder: Secondary | ICD-10-CM | POA: Insufficient documentation

## 2015-08-18 DIAGNOSIS — G8929 Other chronic pain: Secondary | ICD-10-CM | POA: Insufficient documentation

## 2015-08-18 DIAGNOSIS — Z79899 Other long term (current) drug therapy: Secondary | ICD-10-CM | POA: Diagnosis not present

## 2015-08-18 DIAGNOSIS — M1990 Unspecified osteoarthritis, unspecified site: Secondary | ICD-10-CM | POA: Insufficient documentation

## 2015-08-18 DIAGNOSIS — K621 Rectal polyp: Secondary | ICD-10-CM | POA: Insufficient documentation

## 2015-08-18 DIAGNOSIS — Z8249 Family history of ischemic heart disease and other diseases of the circulatory system: Secondary | ICD-10-CM | POA: Diagnosis not present

## 2015-08-18 DIAGNOSIS — K5730 Diverticulosis of large intestine without perforation or abscess without bleeding: Secondary | ICD-10-CM | POA: Insufficient documentation

## 2015-08-18 DIAGNOSIS — J45909 Unspecified asthma, uncomplicated: Secondary | ICD-10-CM | POA: Diagnosis not present

## 2015-08-18 DIAGNOSIS — I10 Essential (primary) hypertension: Secondary | ICD-10-CM | POA: Insufficient documentation

## 2015-08-18 DIAGNOSIS — Z90710 Acquired absence of both cervix and uterus: Secondary | ICD-10-CM | POA: Diagnosis not present

## 2015-08-18 DIAGNOSIS — Z880 Allergy status to penicillin: Secondary | ICD-10-CM | POA: Insufficient documentation

## 2015-08-18 DIAGNOSIS — Z86010 Personal history of colonic polyps: Secondary | ICD-10-CM | POA: Insufficient documentation

## 2015-08-18 HISTORY — PX: COLONOSCOPY: SHX5424

## 2015-08-18 SURGERY — COLONOSCOPY
Anesthesia: Moderate Sedation

## 2015-08-18 MED ORDER — MEPERIDINE HCL 100 MG/ML IJ SOLN
INTRAMUSCULAR | Status: DC | PRN
  Administered 2015-08-18 (×2): 25 mg via INTRAVENOUS
  Administered 2015-08-18: 50 mg via INTRAVENOUS

## 2015-08-18 MED ORDER — ONDANSETRON HCL 4 MG/2ML IJ SOLN
INTRAMUSCULAR | Status: DC | PRN
  Administered 2015-08-18: 4 mg via INTRAVENOUS

## 2015-08-18 MED ORDER — PROMETHAZINE HCL 25 MG/ML IJ SOLN
INTRAMUSCULAR | Status: AC
  Filled 2015-08-18: qty 1

## 2015-08-18 MED ORDER — SODIUM CHLORIDE 0.9 % IV SOLN
INTRAVENOUS | Status: DC
  Administered 2015-08-18: 09:00:00 via INTRAVENOUS

## 2015-08-18 MED ORDER — STERILE WATER FOR IRRIGATION IR SOLN
Status: DC | PRN
  Administered 2015-08-18: 10:00:00

## 2015-08-18 MED ORDER — ONDANSETRON HCL 4 MG/2ML IJ SOLN
INTRAMUSCULAR | Status: AC
  Filled 2015-08-18: qty 2

## 2015-08-18 MED ORDER — MIDAZOLAM HCL 5 MG/5ML IJ SOLN
INTRAMUSCULAR | Status: AC
  Filled 2015-08-18: qty 10

## 2015-08-18 MED ORDER — MEPERIDINE HCL 100 MG/ML IJ SOLN
INTRAMUSCULAR | Status: DC
Start: 2015-08-18 — End: 2015-08-18
  Filled 2015-08-18: qty 2

## 2015-08-18 MED ORDER — SODIUM CHLORIDE 0.9 % IJ SOLN
INTRAMUSCULAR | Status: AC
  Filled 2015-08-18: qty 3

## 2015-08-18 MED ORDER — MIDAZOLAM HCL 5 MG/5ML IJ SOLN
INTRAMUSCULAR | Status: DC | PRN
  Administered 2015-08-18: 2 mg via INTRAVENOUS
  Administered 2015-08-18 (×2): 1 mg via INTRAVENOUS

## 2015-08-18 MED ORDER — PROMETHAZINE HCL 25 MG/ML IJ SOLN
25.0000 mg | Freq: Once | INTRAMUSCULAR | Status: AC
  Administered 2015-08-18: 25 mg via INTRAVENOUS

## 2015-08-18 MED ORDER — SIMETHICONE 40 MG/0.6ML PO SUSP
ORAL | Status: AC
  Filled 2015-08-18: qty 30

## 2015-08-18 NOTE — H&P (View-Only) (Signed)
Primary Care Physician:  FANTA,TESFAYE, MD  Primary Gastroenterologist:  Michael Rourk, MD   Chief Complaint  Patient presents with  . set up TCS    HPI:  Kirsten Walls is a 49 y.o. female here to schedule first ever screening colonoscopy. Due to polypharmacy she was asked office visit. She is doing well. Denies any blood in the stool or melena. No constipation or diarrhea. Denies abdominal pain, heartburn, unintentional weight loss, dysphagia. No family history of colon cancer.    Current Outpatient Prescriptions  Medication Sig Dispense Refill  . albuterol (VENTOLIN HFA) 108 (90 BASE) MCG/ACT inhaler Inhale 2 puffs into the lungs every 6 (six) hours as needed. For shortness of breath/asthma/bronchitis     . ALPRAZolam (XANAX) 0.5 MG tablet Take 0.5 mg by mouth at bedtime as needed for anxiety.    . amLODipine (NORVASC) 5 MG tablet Take 5 mg by mouth daily.    . benzonatate (TESSALON) 100 MG capsule Take 1 capsule (100 mg total) by mouth 3 (three) times daily as needed for cough. 15 capsule 0  . clindamycin (CLEOCIN) 150 MG capsule Take 2 capsules (300 mg total) by mouth 3 (three) times daily. 42 capsule 0  . ibuprofen (ADVIL,MOTRIN) 800 MG tablet Take 800 mg by mouth daily.    . meloxicam (MOBIC) 15 MG tablet Take 15 mg by mouth 2 (two) times daily.    . methocarbamol (ROBAXIN) 500 MG tablet Take 500 mg by mouth every 6 (six) hours as needed.     . naproxen (NAPROSYN) 500 MG tablet Take 1 tablet (500 mg total) by mouth 2 (two) times daily with a meal. As needed for pain 20 tablet 0  . pantoprazole (PROTONIX) 40 MG tablet Take 40 mg by mouth daily.     . permethrin (ELIMITE) 5 % cream Apply to affected area once 60 g 0  . tiZANidine (ZANAFLEX) 2 MG tablet Take 2 mg by mouth every 8 (eight) hours.    . topiramate (TOPAMAX) 25 MG tablet Take 100 mg by mouth daily.    . triamcinolone ointment (KENALOG) 0.5 % Apply 1 application topically 2 (two) times daily. 30 g 0   No current  facility-administered medications for this visit.    Allergies as of 07/27/2015 - Review Complete 07/27/2015  Allergen Reaction Noted  . Penicillins Hives   . Azithromycin Rash 10/24/2013  . Sulfamethoxazole-trimethoprim Hives and Rash 03/16/2014    Past Medical History  Diagnosis Date  . Hypertension   . Arthritis   . Bronchitis   . Asthma   . Seizures ([facility])   . Chronic shoulder pain     Past Surgical History  Procedure Laterality Date  . Cesarean section  x2  . Bunionectomy  both big toe  . Abdominal hysterectomy    . Tumor removal  left knee cap  . Knee surgery    . Foot surgery    . Shoulder arthroscopy      Family History  Problem Relation Age of Onset  . Asthma    . Hypertension Mother   . Hyperlipidemia Mother   . Colon cancer Neg Hx     Social History   Social History  . Marital Status: Single    Spouse Name: N/A  . Number of Children: 3  . Years of Education: N/A   Occupational History  . Not on file.   Social History Main Topics  . Smoking status: Current Every Day Smoker -- 0.50 packs/day for 30 years      Types: Cigarettes  . Smokeless tobacco: Never Used  . Alcohol Use: No  . Drug Use: No  . Sexual Activity: Not Currently    Birth Control/ Protection: Surgical   Other Topics Concern  . Not on file   Social History Narrative      ROS:  General: Negative for anorexia, weight loss, fever, chills, fatigue, weakness. Eyes: Negative for vision changes.  ENT: Negative for hoarseness, difficulty swallowing , nasal congestion. CV: Negative for chest pain, angina, palpitations, dyspnea on exertion, peripheral edema.  Respiratory: Negative for dyspnea at rest, dyspnea on exertion, cough, sputum, wheezing.  GI: See history of present illness. GU:  Negative for dysuria, hematuria, urinary incontinence, urinary frequency, nocturnal urination.  MS: Negative for joint pain, low back pain.  Derm: Negative for rash or itching.  Neuro: Negative for  weakness, abnormal sensation, seizure, frequent headaches, memory loss, confusion.  Psych: Negative for anxiety, depression, suicidal ideation, hallucinations.  Endo: Negative for unusual weight change.  Heme: Negative for bruising or bleeding. Allergy: Negative for rash or hives.    Physical Examination:  BP 102/72 mmHg  Pulse 58  Temp(Src) 97.5 F (36.4 C)  Ht 5' 3" (1.6 m)  Wt 131 lb 9.6 oz (59.693 kg)  BMI 23.32 kg/m2   General: Well-nourished, well-developed in no acute distress.  Head: Normocephalic, atraumatic.   Eyes: Conjunctiva pink, no icterus. Mouth: Oropharyngeal mucosa moist and pink , no lesions erythema or exudate. Neck: Supple without thyromegaly, masses, or lymphadenopathy.  Lungs: Clear to auscultation bilaterally.  Heart: Regular rate and rhythm, no murmurs rubs or gallops.  Abdomen: Bowel sounds are normal, nontender, nondistended, no hepatosplenomegaly or masses, no abdominal bruits or    hernia , no rebound or guarding.   Rectal: Not performed Extremities: No lower extremity edema. No clubbing or deformities.  Neuro: Alert and oriented x 4 , grossly normal neurologically.  Skin: Warm and dry, no rash or jaundice.   Psych: Alert and cooperative, normal mood and affect.  Labs: Labs May 2016 Hemoglobin A1c 5.2, BUN 13, creatinine 1.0, glucose 56, total bilirubin 0.3, alkaline phosphatase 84, AST 16, ALT 20, albumin 4.5, white blood cell count 6900, hemoglobin 13.7, hematocrit 41.9, platelets 273,000  Imaging Studies: Dg Chest 2 View  07/22/2015  CLINICAL DATA:  Cough and wheezing for 2 days EXAM: CHEST  2 VIEW COMPARISON:  September 09, 2012 FINDINGS: Lungs are clear. Heart size and pulmonary vascularity are normal. No adenopathy. No pneumothorax. No bone lesions. IMPRESSION: No abnormality noted. Electronically Signed   By: William  Woodruff III M.D.   On: 07/22/2015 08:00   Mm Digital Screening Bilateral  07/19/2015  CLINICAL DATA:  Screening. EXAM:  DIGITAL SCREENING BILATERAL MAMMOGRAM WITH CAD COMPARISON:  Previous exam(s). ACR Breast Density Category b: There are scattered areas of fibroglandular density. FINDINGS: There are no findings suspicious for malignancy. Images were processed with CAD. IMPRESSION: No mammographic evidence of malignancy. A result letter of this screening mammogram will be mailed directly to the patient. RECOMMENDATION: Screening mammogram in one year. (Code:SM-B-01Y) BI-RADS CATEGORY  1: Negative. Electronically Signed   By: Jeffrey  Hu M.D.   On: 07/19/2015 11:47      

## 2015-08-18 NOTE — Interval H&P Note (Signed)
History and Physical Interval Note:  08/18/2015 10:11 AM  Kirsten Walls  has presented today for surgery, with the diagnosis of SCREENING  The various methods of treatment have been discussed with the patient and family. After consideration of risks, benefits and other options for treatment, the patient has consented to  Procedure(s) with comments: COLONOSCOPY (N/A) - 1000 as a surgical intervention .  The patient's history has been reviewed, patient examined, no change in status, stable for surgery.  I have reviewed the patient's chart and labs.  Questions were answered to the patient's satisfaction.     No change. Screening colonoscopy per plan.  The risks, benefits, limitations, alternatives and imponderables have been reviewed with the patient. Questions have been answered. All parties are agreeable.   Kirsten Walls

## 2015-08-18 NOTE — Discharge Instructions (Signed)
°Colonoscopy °Discharge Instructions ° °Read the instructions outlined below and refer to this sheet in the next few weeks. These discharge instructions provide you with general information on caring for yourself after you leave the hospital. Your doctor may also give you specific instructions. While your treatment has been planned according to the most current medical practices available, unavoidable complications occasionally occur. If you have any problems or questions after discharge, call Dr. Rourk at [phone]. °ACTIVITY °· You may resume your regular activity, but move at a slower pace for the next 24 hours.  °· Take frequent rest periods for the next 24 hours.  °· Walking will help get rid of the air and reduce the bloated feeling in your belly (abdomen).  °· No driving for 24 hours (because of the medicine (anesthesia) used during the test).   °· Do not sign any important legal documents or operate any machinery for 24 hours (because of the anesthesia used during the test).  °NUTRITION °· Drink plenty of fluids.  °· You may resume your normal diet as instructed by your doctor.  °· Begin with a light meal and progress to your normal diet. Heavy or fried foods are harder to digest and may make you feel sick to your stomach (nauseated).  °· Avoid alcoholic beverages for 24 hours or as instructed.  °MEDICATIONS °· You may resume your normal medications unless your doctor tells you otherwise.  °WHAT YOU CAN EXPECT TODAY °· Some feelings of bloating in the abdomen.  °· Passage of more gas than usual.  °· Spotting of blood in your stool or on the toilet paper.  °IF YOU HAD POLYPS REMOVED DURING THE COLONOSCOPY: °· No aspirin products for 7 days or as instructed.  °· No alcohol for 7 days or as instructed.  °· Eat a soft diet for the next 24 hours.  °FINDING OUT THE RESULTS OF YOUR TEST °Not all test results are available during your visit. If your test results are not back during the visit, make an appointment  with your caregiver to find out the results. Do not assume everything is normal if you have not heard from your caregiver or the medical facility. It is important for you to follow up on all of your test results.  °SEEK IMMEDIATE MEDICAL ATTENTION IF: °· You have more than a spotting of blood in your stool.  °· Your belly is swollen (abdominal distention).  °· You are nauseated or vomiting.  °· You have a temperature over 101.  °· You have abdominal pain or discomfort that is severe or gets worse throughout the day.  ° ° °Colon polyp and diverticulosis information provided ° °Further recommendations to follow pending review of pathology report ° ° °Diverticulosis °Diverticulosis is the condition that develops when small pouches (diverticula) form in the wall of your colon. Your colon, or large intestine, is where water is absorbed and stool is formed. The pouches form when the inside layer of your colon pushes through weak spots in the outer layers of your colon. °CAUSES  °No one knows exactly what causes diverticulosis. °RISK FACTORS °· Being older than 50. Your risk for this condition increases with age. Diverticulosis is rare in people younger than 40 years. By age 80, almost everyone has it. °· Eating a low-fiber diet. °· Being frequently constipated. °· Being overweight. °· Not getting enough exercise. °· Smoking. °· Taking over-the-counter pain medicines, like aspirin and ibuprofen. °SYMPTOMS  °Most people with diverticulosis do not have symptoms. °DIAGNOSIS  °  Because diverticulosis often has no symptoms, health care providers often discover the condition during an exam for other colon problems. In many cases, a health care provider will diagnose diverticulosis while using a flexible scope to examine the colon (colonoscopy). °TREATMENT  °If you have never developed an infection related to diverticulosis, you may not need treatment. If you have had an infection before, treatment may include: °· Eating more  fruits, vegetables, and grains. °· Taking a fiber supplement. °· Taking a live bacteria supplement (probiotic). °· Taking medicine to relax your colon. °HOME CARE INSTRUCTIONS  °· Drink at least 6-8 glasses of water each day to prevent constipation. °· Try not to strain when you have a bowel movement. °· Keep all follow-up appointments. °If you have had an infection before:  °· Increase the fiber in your diet as directed by your health care provider or dietitian. °· Take a dietary fiber supplement if your health care provider approves. °· Only take medicines as directed by your health care provider. °SEEK MEDICAL CARE IF:  °· You have abdominal pain. °· You have bloating. °· You have cramps. °· You have not gone to the bathroom in 3 days. °SEEK IMMEDIATE MEDICAL CARE IF:  °· Your pain gets worse. °· Your bloating becomes very bad. °· You have a fever or chills, and your symptoms suddenly get worse. °· You begin vomiting. °· You have bowel movements that are bloody or black. °MAKE SURE YOU: °· Understand these instructions. °· Will watch your condition. °· Will get help right away if you are not doing well or get worse. °  °This information is not intended to replace advice given to you by your health care provider. Make sure you discuss any questions you have with your health care provider. °  °Document Released: 06/01/2004 Document Revised: 09/09/2013 Document Reviewed: 07/30/2013 °Elsevier Interactive Patient Education ©2016 [employer] °Colon Polyps °Polyps are lumps of extra tissue growing inside the body. Polyps can grow in the large intestine (colon). Most colon polyps are noncancerous (benign). However, some colon polyps can become cancerous over time. Polyps that are larger than a pea may be harmful. To be safe, caregivers remove and test all polyps. °CAUSES  °Polyps form when mutations in the genes cause your cells to grow and divide even though no more tissue is needed. °RISK FACTORS °There are a number  of risk factors that can increase your chances of getting colon polyps. They include: °· Being older than 50 years. °· Family history of colon polyps or colon cancer. °· Long-term colon diseases, such as colitis or Crohn disease. °· Being overweight. °· Smoking. °· Being inactive. °· Drinking too much alcohol. °SYMPTOMS  °Most small polyps do not cause symptoms. If symptoms are present, they may include: °· Blood in the stool. The stool may look dark red or black. °· Constipation or diarrhea that lasts longer than 1 week. °DIAGNOSIS °People often do not know they have polyps until their caregiver finds them during a regular checkup. Your caregiver can use 4 tests to check for polyps: °· Digital rectal exam. The caregiver wears gloves and feels inside the rectum. This test would find polyps only in the rectum. °· Barium enema. The caregiver puts a liquid called barium into your rectum before taking X-rays of your colon. Barium makes your colon look white. Polyps are dark, so they are easy to see in the X-ray pictures. °· Sigmoidoscopy. A thin, flexible tube (sigmoidoscope) is placed into your rectum. The sigmoidoscope has a   light and tiny camera in it. The caregiver uses the sigmoidoscope to look at the last third of your colon. °· Colonoscopy. This test is like sigmoidoscopy, but the caregiver looks at the entire colon. This is the most common method for finding and removing polyps. °TREATMENT  °Any polyps will be removed during a sigmoidoscopy or colonoscopy. The polyps are then tested for cancer. °PREVENTION  °To help lower your risk of getting more colon polyps: °· Eat plenty of fruits and vegetables. Avoid eating fatty foods. °· Do not smoke. °· Avoid drinking alcohol. °· Exercise every day. °· Lose weight if recommended by your caregiver. °· Eat plenty of calcium and folate. Foods that are rich in calcium include milk, cheese, and broccoli. Foods that are rich in folate include chickpeas, kidney beans, and  spinach. °HOME CARE INSTRUCTIONS °Keep all follow-up appointments as directed by your caregiver. You may need periodic exams to check for polyps. °SEEK MEDICAL CARE IF: °You notice bleeding during a bowel movement. °  °This information is not intended to replace advice given to you by your health care provider. Make sure you discuss any questions you have with your health care provider. °  °Document Released: 05/31/2004 Document Revised: 09/25/2014 Document Reviewed: 11/14/2011 °Elsevier Interactive Patient Education ©2016 [employer] ° °

## 2015-08-18 NOTE — Op Note (Signed)
[facility] [address] [place], [address]   COLONOSCOPY PROCEDURE REPORT  PATIENT: Kirsten Walls, Kirsten Walls  MR#: [id number] BIRTHDATE: [birth year] , 49  yrs. old GENDER: female ENDOSCOPIST: R.  Garfield Cornea, MD FACP [facility] REFERRED [id number] Legrand Rams, M.D. PROCEDURE DATE:  09/16/15 PROCEDURE:   Colonoscopy with biopsy INDICATIONS:First-ever average risk colorectal cancer screening examination. MEDICATIONS: Versed 4 mg IV and Demerol 100 mg IV in divided doses. Phenergan 25 mg IV.  Zofran 4 mg IV. ASA CLASS:       Class II  CONSENT: The risks, benefits, alternatives and imponderables including but not limited to bleeding, perforation as well as the possibility of a missed lesion have been reviewed.  The potential for biopsy, lesion removal, etc. have also been discussed. Questions have been answered.  All parties agreeable.  Please see the history and physical in the medical record for more information.  DESCRIPTION OF PROCEDURE:   After the risks benefits and alternatives of the procedure were thoroughly explained, informed consent was obtained.  The digital rectal exam revealed no abnormalities of the rectum.   The [id number]  endoscope was introduced through the anus and advanced to the cecum, which was identified by both the appendix and ileocecal valve. No adverse events experienced.   The quality of the prep was adequate  The instrument was then slowly withdrawn as the colon was fully examined. Estimated blood loss is zero unless otherwise noted in this procedure report.      COLON FINDINGS: Normal-appearing rectal mucosa aside from (1) diminutive polyp at 5 cm in from the anal verge.  Rectal vault small. Unable to retroflex. Rectal mucosa seen well on?"face. Scattered left-sided diverticula; the remainder of the colonic mucosa appeared normal.  Retroflexion was not performed. .  Withdrawal time=7 minutes 0 seconds.  The scope was withdrawn  and the procedure completed. COMPLICATIONS: There were no immediate complications. EBL 2 mL ENDOSCOPIC IMPRESSION: Single rectal polyp?"removed as described above. Colonic diverticulosis.  RECOMMENDATIONS: Follow-up on pathology.  eSigned:  R. Garfield Cornea, MD Rosalita Chessman [facility] 09/16/15 10:40 AM   cc:  CPT CODES: ICD CODES:  The ICD and CPT codes recommended by this software are interpretations from the data that the clinical staff has captured with the software.  The verification of the translation of this report to the ICD and CPT codes and modifiers is the sole responsibility of the health care institution and practicing physician where this report was generated.  [employer] will not be held responsible for the validity of the ICD and CPT codes included on this report.  AMA assumes no liability for data contained or not contained herein. CPT is a Designer, television/film set of the [employer].

## 2015-08-22 ENCOUNTER — Encounter: Payer: Self-pay | Admitting: Internal Medicine

## 2015-08-23 ENCOUNTER — Encounter (HOSPITAL_COMMUNITY): Payer: Self-pay | Admitting: Internal Medicine

## 2015-09-06 DIAGNOSIS — R202 Paresthesia of skin: Secondary | ICD-10-CM | POA: Diagnosis not present

## 2015-09-30 DIAGNOSIS — R569 Unspecified convulsions: Secondary | ICD-10-CM | POA: Diagnosis not present

## 2015-09-30 DIAGNOSIS — I10 Essential (primary) hypertension: Secondary | ICD-10-CM | POA: Diagnosis not present

## 2015-09-30 DIAGNOSIS — F17290 Nicotine dependence, other tobacco product, uncomplicated: Secondary | ICD-10-CM | POA: Diagnosis not present

## 2015-09-30 DIAGNOSIS — M25511 Pain in right shoulder: Secondary | ICD-10-CM | POA: Diagnosis not present

## 2015-10-19 DIAGNOSIS — M19011 Primary osteoarthritis, right shoulder: Secondary | ICD-10-CM | POA: Diagnosis not present

## 2015-12-23 DIAGNOSIS — R569 Unspecified convulsions: Secondary | ICD-10-CM | POA: Diagnosis not present

## 2015-12-23 DIAGNOSIS — I10 Essential (primary) hypertension: Secondary | ICD-10-CM | POA: Diagnosis not present

## 2015-12-23 DIAGNOSIS — G4709 Other insomnia: Secondary | ICD-10-CM | POA: Diagnosis not present

## 2015-12-23 DIAGNOSIS — M1990 Unspecified osteoarthritis, unspecified site: Secondary | ICD-10-CM | POA: Diagnosis not present

## 2016-03-30 DIAGNOSIS — R569 Unspecified convulsions: Secondary | ICD-10-CM | POA: Diagnosis not present

## 2016-03-30 DIAGNOSIS — G4709 Other insomnia: Secondary | ICD-10-CM | POA: Diagnosis not present

## 2016-03-30 DIAGNOSIS — I10 Essential (primary) hypertension: Secondary | ICD-10-CM | POA: Diagnosis not present

## 2016-03-30 DIAGNOSIS — F17290 Nicotine dependence, other tobacco product, uncomplicated: Secondary | ICD-10-CM | POA: Diagnosis not present

## 2016-04-07 DIAGNOSIS — M542 Cervicalgia: Secondary | ICD-10-CM | POA: Diagnosis not present

## 2016-04-07 DIAGNOSIS — M79671 Pain in right foot: Secondary | ICD-10-CM | POA: Diagnosis not present

## 2016-04-07 DIAGNOSIS — R569 Unspecified convulsions: Secondary | ICD-10-CM | POA: Diagnosis not present

## 2016-04-26 DIAGNOSIS — M542 Cervicalgia: Secondary | ICD-10-CM | POA: Diagnosis not present

## 2016-04-28 ENCOUNTER — Ambulatory Visit (HOSPITAL_COMMUNITY)
Admission: RE | Admit: 2016-04-28 | Discharge: 2016-04-28 | Disposition: A | Discharge status: Home / Self Care | Payer: Medicare Other | Source: Ambulatory Visit | Attending: Internal Medicine | Admitting: Internal Medicine

## 2016-04-28 ENCOUNTER — Other Ambulatory Visit (HOSPITAL_COMMUNITY): Payer: Self-pay | Admitting: Internal Medicine

## 2016-04-28 DIAGNOSIS — M542 Cervicalgia: Secondary | ICD-10-CM | POA: Diagnosis not present

## 2016-05-04 ENCOUNTER — Ambulatory Visit (INDEPENDENT_AMBULATORY_CARE_PROVIDER_SITE_OTHER): Payer: Medicare Other

## 2016-05-04 ENCOUNTER — Ambulatory Visit (INDEPENDENT_AMBULATORY_CARE_PROVIDER_SITE_OTHER): Payer: Medicare Other | Admitting: Podiatry

## 2016-05-04 ENCOUNTER — Encounter: Payer: Self-pay | Admitting: Podiatry

## 2016-05-04 VITALS — BP 115/70 | HR 54 | Resp 16 | Ht 63.0 in | Wt 136.0 lb

## 2016-05-04 DIAGNOSIS — M79672 Pain in left foot: Secondary | ICD-10-CM | POA: Diagnosis not present

## 2016-05-04 DIAGNOSIS — M79671 Pain in right foot: Secondary | ICD-10-CM

## 2016-05-04 DIAGNOSIS — M779 Enthesopathy, unspecified: Secondary | ICD-10-CM | POA: Diagnosis not present

## 2016-05-04 MED ORDER — TRIAMCINOLONE ACETONIDE 10 MG/ML IJ SUSP
10.0000 mg | Freq: Once | INTRAMUSCULAR | Status: AC
  Administered 2016-05-04: 10 mg

## 2016-05-04 NOTE — Progress Notes (Signed)
   Subjective:    Patient ID: Kirsten Walls, female    DOB: 01-09-1966, 50 y.o.   MRN: [id number]  HPI Chief Complaint  Patient presents with  . Foot Pain    Bilateral; lateral side; knot on side (arch area); pt stated, "Has tingling and numbness in bottom of both feet; Pain starts in toes and radiates to the top of the foot"; x3-4 months      Review of Systems  All other systems reviewed and are negative.      Objective:   Physical Exam        Assessment & Plan:

## 2016-05-05 NOTE — Progress Notes (Signed)
Subjective:     Patient ID: Kirsten Walls, female   DOB: Jan 02, 1966, 50 y.o.   MRN: [id number]  HPI patient presents with moderate discomfort on the outside of both feet and states that she has a lot of back and knee problems also. States it's been hurting for a number of months and getting gradually worse   Review of Systems  All other systems reviewed and are negative.      Objective:   Physical Exam  Constitutional: She is oriented to person, place, and time.  Cardiovascular: Intact distal pulses.   Musculoskeletal: Normal range of motion.  Neurological: She is oriented to person, place, and time.  Skin: Skin is warm.  Nursing note and vitals reviewed.  neurovascular status intact muscle strength adequate range of motion within normal limits with patient found to have quite a bit of discomfort in the outside of the fifth metatarsals bilateral with inflammation and fluid buildup noted. Patient does have mild diminishment of PT pulses but DP are very strong and she does get Muscle achiness at times. Patient is found to have good digital perfusion and is well oriented 3     Assessment:     Peroneal tendinitis at the insertion fifth metatarsal bilateral with possible mild vascular disease    Plan:     H&P x-rays reviewed and I educated her on reducing smoking and signs to look for that may require her to see a vascular doctor. At this point I think it is premature as she does have strong DP pulses and only mild diminishment of PT pulses. I did do a careful injections of the outside fifth metatarsal bilateral and applied fascial brace is to lift up the lateral foot and instructed on usage and patient will be seen back to recheck again in the next 3-4 weeks  X-ray report indicates that structure looks good with no indications of bone injury and mild arthritis

## 2016-07-03 ENCOUNTER — Other Ambulatory Visit (HOSPITAL_COMMUNITY): Payer: Self-pay | Admitting: Internal Medicine

## 2016-07-03 DIAGNOSIS — Z1231 Encounter for screening mammogram for malignant neoplasm of breast: Secondary | ICD-10-CM

## 2016-07-06 DIAGNOSIS — F17290 Nicotine dependence, other tobacco product, uncomplicated: Secondary | ICD-10-CM | POA: Diagnosis not present

## 2016-07-06 DIAGNOSIS — Z0000 Encounter for general adult medical examination without abnormal findings: Secondary | ICD-10-CM | POA: Diagnosis not present

## 2016-07-06 DIAGNOSIS — M1990 Unspecified osteoarthritis, unspecified site: Secondary | ICD-10-CM | POA: Diagnosis not present

## 2016-07-06 DIAGNOSIS — J40 Bronchitis, not specified as acute or chronic: Secondary | ICD-10-CM | POA: Diagnosis not present

## 2016-07-06 DIAGNOSIS — Z23 Encounter for immunization: Secondary | ICD-10-CM | POA: Diagnosis not present

## 2016-07-06 DIAGNOSIS — I10 Essential (primary) hypertension: Secondary | ICD-10-CM | POA: Diagnosis not present

## 2016-07-06 DIAGNOSIS — R569 Unspecified convulsions: Secondary | ICD-10-CM | POA: Diagnosis not present

## 2016-07-06 DIAGNOSIS — M25511 Pain in right shoulder: Secondary | ICD-10-CM | POA: Diagnosis not present

## 2016-07-06 DIAGNOSIS — E785 Hyperlipidemia, unspecified: Secondary | ICD-10-CM | POA: Diagnosis not present

## 2016-07-06 DIAGNOSIS — R739 Hyperglycemia, unspecified: Secondary | ICD-10-CM | POA: Diagnosis not present

## 2016-07-17 ENCOUNTER — Ambulatory Visit (HOSPITAL_COMMUNITY)
Admission: RE | Admit: 2016-07-17 | Discharge: 2016-07-17 | Disposition: A | Discharge status: Home / Self Care | Payer: Medicare Other | Source: Ambulatory Visit | Attending: Internal Medicine | Admitting: Internal Medicine

## 2016-07-17 DIAGNOSIS — Z1231 Encounter for screening mammogram for malignant neoplasm of breast: Secondary | ICD-10-CM | POA: Diagnosis not present

## 2016-08-11 ENCOUNTER — Emergency Department (HOSPITAL_COMMUNITY)
Admission: EM | Admit: 2016-08-11 | Discharge: 2016-08-11 | Disposition: A | Discharge status: Home / Self Care | Payer: Medicare Other | Attending: Emergency Medicine | Admitting: Emergency Medicine

## 2016-08-11 ENCOUNTER — Encounter (HOSPITAL_COMMUNITY): Payer: Self-pay | Admitting: Emergency Medicine

## 2016-08-11 DIAGNOSIS — Z79899 Other long term (current) drug therapy: Secondary | ICD-10-CM | POA: Diagnosis not present

## 2016-08-11 DIAGNOSIS — M5442 Lumbago with sciatica, left side: Secondary | ICD-10-CM | POA: Diagnosis not present

## 2016-08-11 DIAGNOSIS — I10 Essential (primary) hypertension: Secondary | ICD-10-CM | POA: Insufficient documentation

## 2016-08-11 DIAGNOSIS — Z791 Long term (current) use of non-steroidal anti-inflammatories (NSAID): Secondary | ICD-10-CM | POA: Insufficient documentation

## 2016-08-11 DIAGNOSIS — M545 Low back pain: Secondary | ICD-10-CM | POA: Diagnosis present

## 2016-08-11 DIAGNOSIS — F17210 Nicotine dependence, cigarettes, uncomplicated: Secondary | ICD-10-CM | POA: Diagnosis not present

## 2016-08-11 DIAGNOSIS — J45909 Unspecified asthma, uncomplicated: Secondary | ICD-10-CM | POA: Insufficient documentation

## 2016-08-11 DIAGNOSIS — M5432 Sciatica, left side: Secondary | ICD-10-CM

## 2016-08-11 MED ORDER — HYDROCODONE-ACETAMINOPHEN 5-325 MG PO TABS: 2.0000 | ORAL_TABLET | ORAL | 0 refills | Status: DC | PRN

## 2016-08-11 MED ORDER — METHYLPREDNISOLONE SODIUM SUCC 125 MG IJ SOLR
125.0000 mg | Freq: Once | INTRAMUSCULAR | Status: AC
  Administered 2016-08-11: 125 mg via INTRAMUSCULAR
  Filled 2016-08-11: qty 2

## 2016-08-11 MED ORDER — PREDNISONE 10 MG (21) PO TBPK: ORAL_TABLET | ORAL | 0 refills | Status: DC

## 2016-08-11 MED ORDER — KETOROLAC TROMETHAMINE 60 MG/2ML IM SOLN
60.0000 mg | Freq: Once | INTRAMUSCULAR | Status: AC
  Administered 2016-08-11: 60 mg via INTRAMUSCULAR
  Filled 2016-08-11: qty 2

## 2016-08-11 NOTE — ED Provider Notes (Signed)
[facility] DEPT Provider Note   CSN: [id number] Arrival date & time: 08/11/16  1608     History   Chief Complaint Chief Complaint  Patient presents with  . Back Pain    HPI Kirsten Walls is a 50 y.o. female.  The history is provided by the patient. No language interpreter was used.  Back Pain  Location:  Generalized Quality:  Aching Pain severity:  Moderate Pain is:  Worse during the night Onset quality:  Gradual Timing:  Constant Progression:  Worsening Chronicity:  New Relieved by:  Nothing Worsened by:  Nothing Ineffective treatments:  None tried Associated symptoms: no abdominal pain   Pt complains of low back pain.  Pt has had in the past.  Pt reports she has had steroid injections that help  Past Medical History:  Diagnosis Date  . Arthritis   . Asthma   . Bronchitis   . Chronic shoulder pain   . Hypertension   . Seizures [facility])     Patient Active Problem List   Diagnosis Date Noted  . History of colonic polyps   . Diverticulosis of colon without hemorrhage   . Encounter for screening colonoscopy 07/27/2015  . Polypharmacy 07/27/2015  . Arthritis, shoulder region 04/27/2014  . Acute bursitis of right shoulder 02/16/2014  . Shoulder injury 02/16/2014  . 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 BUNION 03/17/2009  . H N P-LUMBAR 06/03/2008  . KNEE, ARTHRITIS, DEGEN./OSTEO 02/11/2008  . NEOPLASMS UNSPEC NATURE BONE SOFT TISSUE&SKIN 02/03/2008  . LOW BACK PAIN 02/03/2008  . DERANGEMENT MENISCUS 01/23/2008  . JOINT EFFUSION, LEFT KNEE 01/23/2008  . TEAR MEDIAL MENISCUS 01/23/2008    Past Surgical History:  Procedure Laterality Date  . ABDOMINAL HYSTERECTOMY    . BUNIONECTOMY  both big toe  . CESAREAN SECTION  x2  . COLONOSCOPY N/A 08/18/2015   Procedure: COLONOSCOPY;  Surgeon: Daneil Dolin, MD;  Location: [facility] ENDO SUITE;  Service: Endoscopy;  Laterality: N/A;  1000  . FOOT SURGERY    . KNEE SURGERY    . SHOULDER ARTHROSCOPY    . TUMOR  REMOVAL  left knee cap    OB History    Gravida Para Term Preterm AB Living   4 4 4     4    SAB TAB Ectopic Multiple Live Births                   Home Medications    Prior to Admission medications   Medication Sig Start Date End Date Taking? Authorizing Provider  albuterol (VENTOLIN HFA) 108 (90 BASE) MCG/ACT inhaler Inhale 2 puffs into the lungs every 6 (six) hours as needed. For shortness of breath/asthma/bronchitis     Historical Provider, MD  ALPRAZolam Duanne Moron) 0.5 MG tablet Take 0.5 mg by mouth at bedtime as needed for anxiety.    Historical Provider, MD  amLODipine (NORVASC) 5 MG tablet Take 5 mg by mouth daily.    Historical Provider, MD  baclofen (LIORESAL) 10 MG tablet Take 10 mg by mouth 2 (two) times daily. Pt takes 2 tablets to equal 20 mg    Historical Provider, MD  benzonatate (TESSALON) 100 MG capsule Take 1 capsule (100 mg total) by mouth 3 (three) times daily as needed for cough. 07/22/15   Francine Graven, DO  ibuprofen (ADVIL,MOTRIN) 800 MG tablet Take 800 mg by mouth daily.    Historical Provider, MD  methocarbamol (ROBAXIN) 500 MG tablet Take 500 mg by mouth every 6 (six) hours as needed.  02/02/15   Historical Provider, MD  naproxen (NAPROSYN) 500 MG tablet Take 1 tablet (500 mg total) by mouth 2 (two) times daily with a meal. As needed for pain 05/28/15   Fransico Meadow, PA-C  pantoprazole (PROTONIX) 40 MG tablet Take 40 mg by mouth daily.  02/11/15   Historical Provider, MD  polyethylene glycol-electrolytes (TRILYTE) 420 G solution Take 4,000 mLs by mouth as directed. 07/27/15   Daneil Dolin, MD  tiZANidine (ZANAFLEX) 2 MG tablet Take 2 mg by mouth every 8 (eight) hours.    Historical Provider, MD  topiramate (TOPAMAX) 25 MG tablet Take 100 mg by mouth daily.    Historical Provider, MD  triamcinolone ointment (KENALOG) 0.5 % Apply 1 application topically 2 (two) times daily. 05/28/15   Fransico Meadow, PA-C    Family History Family History  Problem Relation Age of  Onset  . Hypertension Mother   . Hyperlipidemia Mother   . Asthma    . Colon cancer Neg Hx     Social History Social History  Substance Use Topics  . Smoking status: Current Every Day Smoker    Packs/day: 0.50    Years: 30.00    Types: Cigarettes  . Smokeless tobacco: Never Used  . Alcohol use No     Allergies   Penicillins; Azithromycin; and Sulfamethoxazole-trimethoprim   Review of Systems Review of Systems  Gastrointestinal: Negative for abdominal pain.  Musculoskeletal: Positive for back pain.  All other systems reviewed and are negative.    Physical Exam Updated Vital Signs BP 131/85 (BP Location: Left Arm)   Pulse 64   Temp 98.1 F (36.7 C) (Oral)   Resp 18   Ht 5\' 3"  (1.6 m)   Wt 60.8 kg   SpO2 100%   BMI 23.74 kg/m   Physical Exam  Constitutional: She is oriented to person, place, and time. She appears well-developed and well-nourished.  HENT:  Head: Normocephalic.  Eyes: EOM are normal.  Neck: Normal range of motion.  Cardiovascular: Normal rate and regular rhythm.   Pulmonary/Chest: Effort normal and breath sounds normal.  Abdominal: Soft. She exhibits no distension.  Musculoskeletal: Normal range of motion.  Tender lower lumbar spine diffusely.  Pain with movement. Pain with moving left leg, nv and ns intact   Neurological: She is alert and oriented to person, place, and time.  Skin: Skin is warm.  Psychiatric: She has a normal mood and affect.  Nursing note and vitals reviewed.    ED Treatments / Results  Labs (all labs ordered are listed, but only abnormal results are displayed) Labs Reviewed - No data to display  EKG  EKG Interpretation None       Radiology No results found.  Procedures Procedures (including critical care time)  Medications Ordered in ED Medications  methylPREDNISolone sodium succinate (SOLU-MEDROL) 125 mg/2 mL injection 125 mg (not administered)  ketorolac (TORADOL) injection 60 mg (not administered)      Initial Impression / Assessment and Plan / ED Course  I have reviewed the triage vital signs and the nursing notes.  Pertinent labs & imaging results that were available during my care of the patient were reviewed by me and considered in my medical decision making (see chart for details).  Clinical Course     Pt has had steroid injections in the past.   Pt advised to see Dr. Legrand Rams for recheck.    Final Clinical Impressions(s) / ED Diagnoses   Final diagnoses:  Acute bilateral low  back pain with left-sided sciatica  Sciatica of left side    New Prescriptions New Prescriptions   HYDROCODONE-ACETAMINOPHEN (NORCO/VICODIN) 5-325 MG TABLET    Take 2 tablets by mouth every 4 (four) hours as needed.   PREDNISONE (STERAPRED UNI-PAK 21 TAB) 10 MG (21) TBPK TABLET    6,5,4,3,2,1 taper  An After Visit Summary was printed and given to the patient.   Fransico Meadow, PA-C 08/11/16 [place], PA-C 08/17/16 [place], MD 08/21/16 [phone]

## 2016-08-11 NOTE — ED Triage Notes (Signed)
Pt reports lumbar back pain that started this am. No known injury.

## 2016-09-27 NOTE — Congregational Nurse Program (Signed)
Congregational Nurse Program Note  Date of Encounter: 09/26/2016  Past Medical History: Past Medical History:  Diagnosis Date  . Arthritis   . Asthma   . Bronchitis   . Chronic shoulder pain   . Hypertension   . Seizures ([place])     Encounter Details:     CNP Questionnaire - 09/26/16 1030      Patient Demographics   Is this a new or existing patient? New   Patient is considered a/an Not Applicable   Race African-American/Black     Patient Assistance   Location of Patient Assistance [facility] [place]   Patient's financial/insurance status Medicaid;Medicare;Low Income   Uninsured Patient ([place] [employer]s) No   Patient referred to apply for the following financial assistance Not Applicable   Food insecurities addressed Referred to food bank or resource   Transportation assistance No   Assistance securing medications No   Educational health offerings Chronic disease;Medications;Hypertension     Encounter Details   Primary purpose of visit Chronic Illness/Condition Visit;Education/Health Concerns;Navigating the Healthcare System   Was an Emergency Department visit averted? Not Applicable   Does patient have a medical provider? Yes   Patient referred to Not Applicable   Was a mental health screening completed? (GAINS tool) No   Does patient have dental issues? No   Does patient have vision issues? No   Does your patient have an abnormal blood pressure today? No   Since previous encounter, have you referred patient for abnormal blood pressure that resulted in a new diagnosis or medication change? No   Does your patient have an abnormal blood glucose today? No   Since previous encounter, have you referred patient for abnormal blood glucose that resulted in a new diagnosis or medication change? No   Was there a life-saving intervention made? No     New client to [facility] program encountered [facility] [place].  Client has a primary care provider and is being treated per client for hypertension. Encouraged client to avoid salt, to consider smoking cessation. Counseled client to follow her primary medical provider's instructions and take medications as prescribed by her provider and continue for follow up care as suggested by her primary provider.

## 2016-10-02 DIAGNOSIS — R569 Unspecified convulsions: Secondary | ICD-10-CM | POA: Diagnosis not present

## 2016-10-02 DIAGNOSIS — F17290 Nicotine dependence, other tobacco product, uncomplicated: Secondary | ICD-10-CM | POA: Diagnosis not present

## 2016-10-02 DIAGNOSIS — I10 Essential (primary) hypertension: Secondary | ICD-10-CM | POA: Diagnosis not present

## 2016-10-02 DIAGNOSIS — G4709 Other insomnia: Secondary | ICD-10-CM | POA: Diagnosis not present

## 2016-12-22 DIAGNOSIS — H6091 Unspecified otitis externa, right ear: Secondary | ICD-10-CM | POA: Diagnosis not present

## 2016-12-22 DIAGNOSIS — J029 Acute pharyngitis, unspecified: Secondary | ICD-10-CM | POA: Diagnosis not present

## 2017-01-24 DIAGNOSIS — R569 Unspecified convulsions: Secondary | ICD-10-CM | POA: Diagnosis not present

## 2017-01-24 DIAGNOSIS — I10 Essential (primary) hypertension: Secondary | ICD-10-CM | POA: Diagnosis not present

## 2017-01-24 DIAGNOSIS — M79641 Pain in right hand: Secondary | ICD-10-CM | POA: Diagnosis not present

## 2017-01-24 DIAGNOSIS — F411 Generalized anxiety disorder: Secondary | ICD-10-CM | POA: Diagnosis not present

## 2017-02-05 ENCOUNTER — Ambulatory Visit (INDEPENDENT_AMBULATORY_CARE_PROVIDER_SITE_OTHER): Payer: Medicare Other | Admitting: Podiatry

## 2017-02-05 ENCOUNTER — Encounter: Payer: Self-pay | Admitting: Podiatry

## 2017-02-05 DIAGNOSIS — M7670 Peroneal tendinitis, unspecified leg: Secondary | ICD-10-CM

## 2017-02-05 MED ORDER — METHYLPREDNISOLONE 4 MG PO TBPK: ORAL_TABLET | ORAL | 0 refills | Status: DC

## 2017-02-05 MED ORDER — DICLOFENAC SODIUM 75 MG PO TBEC: 75.0000 mg | DELAYED_RELEASE_TABLET | Freq: Two times a day (BID) | ORAL | 0 refills | Status: DC

## 2017-02-05 MED ORDER — CAPSAICIN 0.075 % EX CREA: 1.0000 "application " | TOPICAL_CREAM | Freq: Two times a day (BID) | CUTANEOUS | 1 refills | Status: DC

## 2017-02-05 NOTE — Progress Notes (Signed)
   HPI: Patient is a 51 year old female presenting today with a complaint of posterior heel pain bilaterally. She states the pain radiates of her posterior legs and down the lateral sides of bilateral feet.    Physical Exam: General: The patient is alert and oriented x3 in no acute distress.  Dermatology: Skin is warm, dry and supple bilateral lower extremities. Negative for open lesions or macerations.  Vascular: Palpable pedal pulses bilaterally. No edema or erythema noted. Capillary refill within normal limits.  Neurological: Epicritic and protective threshold grossly intact bilaterally.   Musculoskeletal Exam: Pain on palpation to base of fifth metatarsal at the insertion of the peroneus brevis tendon bilaterally. Range of motion within normal limits to all pedal and ankle joints bilateral. Muscle strength 5/5 in all groups bilateral.    Assessment: 1. Peroneal enthesopathy bilateral 2. Peroneal tendinitis   Plan of Care:  1. Patient was evaluated. 2. Prescription for Medrol Dosepak given. 3. Prescription for diclofenac 75 mg given. 4. Prescription for capsaicin cream given. 5. Instructed patient not to go barefoot. 6. Return to clinic in 3 weeks.   Edrick Kins, DPM [facility]  Dr. Edrick Kins, [place]                                        [place], [place] [address]                Office [phone]  Fax [phone]

## 2017-02-06 ENCOUNTER — Encounter: Payer: Self-pay | Admitting: Orthopedic Surgery

## 2017-02-06 ENCOUNTER — Ambulatory Visit (INDEPENDENT_AMBULATORY_CARE_PROVIDER_SITE_OTHER): Payer: Medicare Other

## 2017-02-06 ENCOUNTER — Ambulatory Visit (INDEPENDENT_AMBULATORY_CARE_PROVIDER_SITE_OTHER): Payer: Medicare Other | Admitting: Orthopedic Surgery

## 2017-02-06 VITALS — BP 116/81 | HR 62 | Ht 62.0 in | Wt 133.0 lb

## 2017-02-06 DIAGNOSIS — M79641 Pain in right hand: Secondary | ICD-10-CM

## 2017-02-06 DIAGNOSIS — M654 Radial styloid tenosynovitis [de Quervain]: Secondary | ICD-10-CM

## 2017-02-06 NOTE — Patient Instructions (Addendum)
ICE THE WRIST AND HAND 3 X A DAY FOR 20 MIN   WEAR BRACE FOR 6 WEEKS   TAKE IBUPROFEN 3 TIMES A DAY  MAKE APPT FOR EVAL OF YOUR FEET De Quervain Tenosynovitis Tendons attach muscles to bones. They also help with joint movements. When tendons become irritated or swollen, it is called tendinitis. The extensor pollicis brevis (EPB) tendon connects the EPB muscle to a bone that is near the base of the thumb. The EPB muscle helps to straighten and extend the thumb. De Quervain tenosynovitis is a condition in which the EPB tendon lining (sheath) becomes irritated, thickened, and swollen. This condition is sometimes called stenosing tenosynovitis. This condition causes pain on the thumb side of the back of the wrist. What are the causes? Causes of this condition include:  Activities that repeatedly cause your thumb and wrist to extend.  A sudden increase in activity or change in activity that affects your wrist. What increases the risk? This condition is more likely to develop in:  Females.  People who have diabetes.  Women who have recently given birth.  People who are over 22 years of age.  People who do activities that involve repeated hand and wrist motions, such as tennis, racquetball, volleyball, gardening, and taking care of children.  People who do heavy labor.  People who have poor wrist strength and flexibility.  People who do not warm up properly before activities. What are the signs or symptoms? Symptoms of this condition include:  Pain or tenderness over the thumb side of the back of the wrist when your thumb and wrist are not moving.  Pain that gets worse when you straighten your thumb or extend your thumb or wrist.  Pain when the injured area is touched.  Locking or catching of the thumb joint while you bend and straighten your thumb.  Decreased thumb motion due to pain.  Swelling over the affected area. How is this diagnosed? This condition is diagnosed with a  medical history and physical exam. Your health care provider will ask for details about your injury and ask about your symptoms. How is this treated? Treatment may include the use of icing and medicines to reduce pain and swelling. You may also be advised to wear a splint or brace to limit your thumb and wrist motion. In less severe cases, treatment may also include working with a physical therapist to strengthen your wrist and calm the irritation around your EPB tendon sheath. In severe cases, surgery may be needed. Follow these instructions at home: If you have a splint or brace:   Wear it as told by your health care provider. Remove it only as told by your health care provider.  Loosen the splint or brace if your fingers become numb and tingle, or if they turn cold and blue.  Keep the splint or brace clean and dry. Managing pain, stiffness, and swelling   If directed, apply ice to the injured area.  Put ice in a plastic bag.  Place a towel between your skin and the bag.  Leave the ice on for 20 minutes, 2-3 times per day.  Move your fingers often to avoid stiffness and to lessen swelling.  Raise (elevate) the injured area above the level of your heart while you are sitting or lying down. General instructions   Return to your normal activities as told by your health care provider. Ask your health care provider what activities are safe for you.  Take over-the-counter and  prescription medicines only as told by your health care provider.  Keep all follow-up visits as told by your health care provider. This is important.  Do not drive or operate heavy machinery while taking prescription pain medicine. Contact a health care provider if:  Your pain, tenderness, or swelling gets worse, even if you have had treatment.  You have numbness or tingling in your wrist, hand, or fingers on the injured side. This information is not intended to replace advice given to you by your health care  provider. Make sure you discuss any questions you have with your health care provider. Document Released: 09/04/2005 Document Revised: 02/10/2016 Document Reviewed: 11/10/2014 Elsevier Interactive Patient Education  2017 [employer].

## 2017-02-06 NOTE — Progress Notes (Signed)
NEW PATIENT OFFICE VISIT    Chief Complaint  Patient presents with  . Hand Injury    right hand pain, fell 2 weeks ago.    51 yo RHD FELL 2 WEEKS AGO NOW C/O DORSAL RADIAL wrist and thumb pain in trouble picking up heavy objects such as proximal pain and. She describes a dull pain exacerbated by ulnar deviation which is constant but worse with those maneuvers    Review of Systems  Constitutional: Negative for chills and fever.  Musculoskeletal: Positive for joint pain.       Feet hurt  Neurological: Negative for tingling, sensory change and focal weakness.    Past Medical History:  Diagnosis Date  . Arthritis   . Asthma   . Bronchitis   . Chronic shoulder pain   . Hypertension   . Seizures ([place])     Past Surgical History:  Procedure Laterality Date  . ABDOMINAL HYSTERECTOMY    . BUNIONECTOMY  both big toe  . CESAREAN SECTION  x2  . COLONOSCOPY N/A 08/18/2015   Procedure: COLONOSCOPY;  Surgeon: Kirsten Dolin, MD;  [facility];  Service: Endoscopy;  Laterality: N/A;  1000  . FOOT SURGERY    . KNEE SURGERY    . SHOULDER ARTHROSCOPY    . TUMOR REMOVAL  left knee cap    Family History  Problem Relation Age of Onset  . Hypertension Mother   . Hyperlipidemia Mother   . Asthma Unknown   . Colon cancer Neg Hx    Social History  Substance Use Topics  . Smoking status: Current Every Day Smoker    Packs/day: 0.50    Years: 30.00    Types: Cigarettes  . Smokeless tobacco: Never Used  . Alcohol use No    BP 116/81   Pulse 62   Ht 5\' 2"  (1.575 m)   Wt 133 lb (60.3 kg)   BMI 24.33 kg/m   Physical Exam  Constitutional: She is oriented to person, place, and time. She appears well-developed and well-nourished. No distress.  Cardiovascular: Normal rate and intact distal pulses.   Neurological: She is alert and oriented to person, place, and time. She has normal reflexes. She exhibits normal muscle tone. Coordination normal.  Skin: Skin is warm and  dry. No rash noted. She is not diaphoretic. No erythema. No pallor.  Psychiatric: She has a normal mood and affect. Her behavior is normal. Judgment and thought content normal.    Ortho Exam Right wrist tender over the first extensor compartment. Remaining portion of the hand and wrist nontender including ulna and radius. Painful ulnar deviation normal range of motion flexion-extension pronation supination older and radial deviation  Wrist joint stability test was normal  Grip strength was normal  Neurovascular exam revealed normal sensation and pulse normal color capillary refill and temperature and skin was intact and normal without erythema No orders of the defined types were placed in this encounter.   No diagnosis found.   Current Outpatient Prescriptions:  .  albuterol (VENTOLIN HFA) 108 (90 BASE) MCG/ACT inhaler, Inhale 2 puffs into the lungs every 6 (six) hours as needed. For shortness of breath/asthma/bronchitis , Disp: , Rfl:  .  ALPRAZolam (XANAX) 0.5 MG tablet, Take 0.5 mg by mouth at bedtime as needed for anxiety., Disp: , Rfl:  .  amLODipine (NORVASC) 5 MG tablet, Take 5 mg by mouth daily., Disp: , Rfl:  .  baclofen (LIORESAL) 10 MG tablet, Take 10 mg by mouth 2 (  two) times daily. Pt takes 2 tablets to equal 20 mg, Disp: , Rfl:  .  benzonatate (TESSALON) 100 MG capsule, Take 1 capsule (100 mg total) by mouth 3 (three) times daily as needed for cough., Disp: 15 capsule, Rfl: 0 .  capsicum (ZOSTRIX) 0.075 % topical cream, Apply 1 application topically 2 (two) times daily., Disp: 28.3 g, Rfl: 1 .  diclofenac (VOLTAREN) 75 MG EC tablet, Take 1 tablet (75 mg total) by mouth 2 (two) times daily., Disp: 60 tablet, Rfl: 0 .  HYDROcodone-acetaminophen (NORCO/VICODIN) 5-325 MG tablet, Take 2 tablets by mouth every 4 (four) hours as needed., Disp: 10 tablet, Rfl: 0 .  ibuprofen (ADVIL,MOTRIN) 800 MG tablet, Take 800 mg by mouth daily., Disp: , Rfl:  .  methocarbamol (ROBAXIN) 500 MG  tablet, Take 500 mg by mouth every 6 (six) hours as needed. , Disp: , Rfl:  .  methylPREDNISolone (MEDROL DOSEPAK) 4 MG TBPK tablet, 6 day dose pack - take as directed, Disp: 21 tablet, Rfl: 0 .  naproxen (NAPROSYN) 500 MG tablet, Take 1 tablet (500 mg total) by mouth 2 (two) times daily with a meal. As needed for pain, Disp: 20 tablet, Rfl: 0 .  pantoprazole (PROTONIX) 40 MG tablet, Take 40 mg by mouth daily. , Disp: , Rfl:  .  polyethylene glycol-electrolytes (TRILYTE) 420 G solution, Take 4,000 mLs by mouth as directed., Disp: 4000 mL, Rfl: 0 .  predniSONE (STERAPRED UNI-PAK 21 TAB) 10 MG (21) TBPK tablet, 6,5,4,3,2,1 taper, Disp: 21 tablet, Rfl: 0 .  tiZANidine (ZANAFLEX) 2 MG tablet, Take 2 mg by mouth every 8 (eight) hours., Disp: , Rfl:  .  topiramate (TOPAMAX) 25 MG tablet, Take 100 mg by mouth daily., Disp: , Rfl:  .  triamcinolone ointment (KENALOG) 0.5 %, Apply 1 application topically 2 (two) times daily., Disp: 30 g, Rfl: 0  PLAN:   X-rays of the right hand  Diagnosis

## 2017-02-26 ENCOUNTER — Ambulatory Visit: Payer: Medicare Other | Admitting: Podiatry

## 2017-03-12 ENCOUNTER — Ambulatory Visit: Payer: Medicare Other | Admitting: Podiatry

## 2017-03-12 ENCOUNTER — Ambulatory Visit (INDEPENDENT_AMBULATORY_CARE_PROVIDER_SITE_OTHER): Payer: Medicare Other | Admitting: Orthopedic Surgery

## 2017-03-12 ENCOUNTER — Encounter: Payer: Self-pay | Admitting: Orthopedic Surgery

## 2017-03-12 VITALS — BP 118/79 | HR 51 | Wt 139.0 lb

## 2017-03-12 DIAGNOSIS — M7670 Peroneal tendinitis, unspecified leg: Secondary | ICD-10-CM

## 2017-03-12 DIAGNOSIS — M7672 Peroneal tendinitis, left leg: Secondary | ICD-10-CM | POA: Diagnosis not present

## 2017-03-12 NOTE — Progress Notes (Addendum)
NEW PROBLEM/OFFICE VISIT    Chief Complaint  Patient presents with  . Foot Pain    bilateral foot pain    51 year old female who was seen at the [facility] for peroneal tendinitis treated with injection right and left foot as well as NSAIDs and comes to [place] due to being unhappy with the results of that treatment.  She reports atraumatic onset of bilateral lateral foot pain over the peroneal tendons with swelling and difficulty shoe wear and inability to lay her foot on the side. The pain is constant dull aching and associated with some swelling over the lateral border of the right and left foot    Review of Systems  Constitutional: Negative for chills and fever.  Skin: Negative for rash.     Past Medical History:  Diagnosis Date  . Arthritis   . Asthma   . Bronchitis   . Chronic shoulder pain   . Hypertension   . Seizures ([place])     Past Surgical History:  Procedure Laterality Date  . ABDOMINAL HYSTERECTOMY    . BUNIONECTOMY  both big toe  . CESAREAN SECTION  x2  . COLONOSCOPY N/A 08/18/2015   Procedure: COLONOSCOPY;  Surgeon: Daneil Dolin, MD;  [facility];  Service: Endoscopy;  Laterality: N/A;  1000  . FOOT SURGERY    . KNEE SURGERY    . SHOULDER ARTHROSCOPY    . TUMOR REMOVAL  left knee cap    Family History  Problem Relation Age of Onset  . Hypertension Mother   . Hyperlipidemia Mother   . Asthma Unknown   . Colon cancer Neg Hx    Social History  Substance Use Topics  . Smoking status: Current Every Day Smoker    Packs/day: 0.50    Years: 30.00    Types: Cigarettes  . Smokeless tobacco: Never Used  . Alcohol use No    BP 118/79   Pulse (!) 51   Wt 139 lb (63 kg)   BMI 25.42 kg/m   Physical Exam  Constitutional: She is oriented to person, place, and time. She appears well-developed and well-nourished. No distress.  Cardiovascular: Normal rate and intact distal pulses.   Neurological: She is alert and oriented to person,  place, and time. She has normal reflexes. She exhibits normal muscle tone. Coordination normal.  Skin: Skin is warm and dry. No rash noted. She is not diaphoretic. No erythema. No pallor.  Psychiatric: She has a normal mood and affect. Her behavior is normal. Judgment and thought content normal.    Left Ankle Exam   Comments:  She has tenderness over the peroneal tendon insertion with some swelling. This is exacerbated by passive range of motion inversion but full range of motion is noted at the ankle ankle joint is stable  Right and left lower extremity exam In the standing position she has a flexible flatfoot with no posterior tibial tendinitis. No weakness. Skin warm dry and intact pulses good sensation normal, pretty much bilateral exam is the same      Encounter Diagnoses  Name Primary?  . Peroneal tendonitis of left lower leg Yes  . Peroneal tendinitis, unspecified laterality    Prior x-rays done in August show 2 pins in the great toe metatarsal for I presume bunion surgery. She has a abnormal TMT angle.  PLAN:   I really don't have any options for her so I will let her see a foot and ankle specialist on the orthopedic side  to delineate further treatment

## 2017-04-09 ENCOUNTER — Telehealth: Payer: Self-pay | Admitting: Orthopedic Surgery

## 2017-04-09 NOTE — Telephone Encounter (Signed)
Kirsten Walls came by the office stating we were to refer her to another doctor but but her phone has been out of order.  We have given her the phone number to Dr. Jess Barters office and she will call them herself.

## 2017-04-27 DIAGNOSIS — M25511 Pain in right shoulder: Secondary | ICD-10-CM | POA: Diagnosis not present

## 2017-04-27 DIAGNOSIS — E559 Vitamin D deficiency, unspecified: Secondary | ICD-10-CM | POA: Diagnosis not present

## 2017-04-27 DIAGNOSIS — M1990 Unspecified osteoarthritis, unspecified site: Secondary | ICD-10-CM | POA: Diagnosis not present

## 2017-04-27 DIAGNOSIS — R5383 Other fatigue: Secondary | ICD-10-CM | POA: Diagnosis not present

## 2017-04-27 DIAGNOSIS — E7800 Pure hypercholesterolemia, unspecified: Secondary | ICD-10-CM | POA: Diagnosis not present

## 2017-04-27 DIAGNOSIS — I10 Essential (primary) hypertension: Secondary | ICD-10-CM | POA: Diagnosis not present

## 2017-04-27 DIAGNOSIS — R569 Unspecified convulsions: Secondary | ICD-10-CM | POA: Diagnosis not present

## 2017-04-27 DIAGNOSIS — J40 Bronchitis, not specified as acute or chronic: Secondary | ICD-10-CM | POA: Diagnosis not present

## 2017-04-27 DIAGNOSIS — E785 Hyperlipidemia, unspecified: Secondary | ICD-10-CM | POA: Diagnosis not present

## 2017-04-27 DIAGNOSIS — E538 Deficiency of other specified B group vitamins: Secondary | ICD-10-CM | POA: Diagnosis not present

## 2017-05-03 ENCOUNTER — Institutional Professional Consult (permissible substitution): Payer: Medicare Other | Admitting: Neurology

## 2017-05-10 DIAGNOSIS — T6391XD Toxic effect of contact with unspecified venomous animal, accidental (unintentional), subsequent encounter: Secondary | ICD-10-CM | POA: Diagnosis not present

## 2017-05-10 DIAGNOSIS — I10 Essential (primary) hypertension: Secondary | ICD-10-CM | POA: Diagnosis not present

## 2017-05-30 ENCOUNTER — Institutional Professional Consult (permissible substitution): Payer: Medicare Other | Admitting: Neurology

## 2017-06-26 ENCOUNTER — Institutional Professional Consult (permissible substitution): Payer: Medicare Other | Admitting: Neurology

## 2017-07-03 DIAGNOSIS — R569 Unspecified convulsions: Secondary | ICD-10-CM | POA: Diagnosis not present

## 2017-07-03 DIAGNOSIS — I10 Essential (primary) hypertension: Secondary | ICD-10-CM | POA: Diagnosis not present

## 2017-07-03 DIAGNOSIS — L259 Unspecified contact dermatitis, unspecified cause: Secondary | ICD-10-CM | POA: Diagnosis not present

## 2017-07-03 DIAGNOSIS — Z23 Encounter for immunization: Secondary | ICD-10-CM | POA: Diagnosis not present

## 2017-07-09 DIAGNOSIS — L03012 Cellulitis of left finger: Secondary | ICD-10-CM | POA: Diagnosis not present

## 2017-07-23 DIAGNOSIS — Z1389 Encounter for screening for other disorder: Secondary | ICD-10-CM | POA: Diagnosis not present

## 2017-07-23 DIAGNOSIS — E559 Vitamin D deficiency, unspecified: Secondary | ICD-10-CM | POA: Diagnosis not present

## 2017-07-23 DIAGNOSIS — I10 Essential (primary) hypertension: Secondary | ICD-10-CM | POA: Diagnosis not present

## 2017-07-23 DIAGNOSIS — M1990 Unspecified osteoarthritis, unspecified site: Secondary | ICD-10-CM | POA: Diagnosis not present

## 2017-07-23 DIAGNOSIS — R569 Unspecified convulsions: Secondary | ICD-10-CM | POA: Diagnosis not present

## 2017-07-26 ENCOUNTER — Other Ambulatory Visit (HOSPITAL_COMMUNITY): Payer: Self-pay | Admitting: Internal Medicine

## 2017-07-26 ENCOUNTER — Encounter: Payer: Self-pay | Admitting: Neurology

## 2017-07-26 DIAGNOSIS — Z1231 Encounter for screening mammogram for malignant neoplasm of breast: Secondary | ICD-10-CM

## 2017-07-30 ENCOUNTER — Encounter: Payer: Self-pay | Admitting: Neurology

## 2017-07-30 ENCOUNTER — Ambulatory Visit (INDEPENDENT_AMBULATORY_CARE_PROVIDER_SITE_OTHER): Payer: Medicare Other | Admitting: Neurology

## 2017-07-30 VITALS — BP 117/77 | HR 67 | Ht 62.0 in | Wt 140.0 lb

## 2017-07-30 DIAGNOSIS — F5105 Insomnia due to other mental disorder: Secondary | ICD-10-CM

## 2017-07-30 DIAGNOSIS — G4700 Insomnia, unspecified: Secondary | ICD-10-CM | POA: Insufficient documentation

## 2017-07-30 DIAGNOSIS — F5104 Psychophysiologic insomnia: Secondary | ICD-10-CM | POA: Diagnosis not present

## 2017-07-30 MED ORDER — MELATONIN 3 MG PO TABS: 3.0000 mg | ORAL_TABLET | Freq: Every day | ORAL | 0 refills | Status: DC

## 2017-07-30 NOTE — Progress Notes (Signed)
SLEEP MEDICINE CLINIC   Provider:  Larey Seat, [place] D  Primary Care Physician:  Rosita Fire, MD   Referring Provider: Rosita Fire, MD    Chief Complaint  Patient presents with  . New Patient (Initial Visit)    pt alone, pt states that for about a year she has been struggling with sleep. she states that she gets 3 days out of the week where she gets rest. pt is unable to read and write   The patient has been referred to psychiatry according to Dr. Josephine Cables notes. She claims she doesn't know about a psych referral.   [name] is a 51 year old African-American-she insists that she is 36. She is referred with a diagnosis of generalized anxiety disorder, and on 24 Jan 2017 was referred to psychiatry according to Dr. Ansel Bong notes.  However I find her today in the sleep clinic stating that she has great difficulty sleeping at night and that she is not sleepy and daytime.  She also has pain hip pain, low back pain and is on a variety of muscle relaxants which could induce snoring or apnea.  She also takes Proventil inhalers for asthma.  Protonix for GERD, amlodipine for hypertension, she is on baclofen, tizanidine, Norvasc, Topamax, Proventil, Protonix.    HPI:  Kirsten Walls is a 51 y.o. female , seen here as in a referral/ revisit  from Dr. Legrand Rams for chronic  Generalized anxiety with insomnia.   Chief complaint according to patient :" I can't sleep "  Sleep habits are as follows:" I do not have no time to just go to bed when I feels sleepy"  The patient cannot specify a habitual time, gets agitated and loud.  A long time ago she used to work third shift but she is no longer gainfully employed at this time and there is also no routine time to wake up in the morning. The patient lives by herself, she does not share her bedroom, she describes her bedroom is dark, quiet and cool.  She lets two fans run in the background. If she manages to fall asleep at one time however she will  only sleep for 2-3 hours through an right now only with the help of Xanax. Her doctor has taken her off Xanax and she has "not slept at all", she reports. She would like a bedtime - but feels frustrated.  Last night she did not sleep before midnight - she slept until 1.30 AM , or " something like that".  She is going to the restroom every hour. She has been told she sometimes snores. She has not been told to have apnea.    Sleep medical history and family sleep history: She is not aware of any family members carrying a diagnosis of sleep apnea, her mother has insomnia. The patient is on only 1 high blood pressure medicine, on one asthma medication, she does have recurrent bronchitis.    She is aware that this clinic does not follow chronic pain disorders, Psychiatric disorders  Social history: single - beer drinker, smoker- 1/2 ppd until September - quit on nicotrol patch. She got pregnant in 11 th grade , broke off school. Now has 3 daughters and one son.    Review of Systems: Out of a complete 14 system review, the patient complains of only the following symptoms, and all other reviewed systems are negative.  "I just cannot sleep and my Xanax was cut off" .  She decribes herself as depressed and  happy-   Epworth score 0 , Fatigue severity score 9  , depression score n/a     Social History   Socioeconomic History  . Marital status: Single    Spouse name: Not on file  . Number of children: 3  . Years of education: Not on file  . Highest education level: Not on file  Social Needs  . Financial resource strain: Not on file  . Food insecurity - worry: Not on file  . Food insecurity - inability: Not on file  . Transportation needs - medical: Not on file  . Transportation needs - non-medical: Not on file  Occupational History  . Not on file  Tobacco Use  . Smoking status: Former Smoker    Packs/day: 0.50    Years: 30.00    Pack years: 15.00    Types: Cigarettes  . Smokeless  tobacco: Never Used  Substance and Sexual Activity  . Alcohol use: No  . Drug use: No  . Sexual activity: Not Currently    Birth control/protection: Surgical  Other Topics Concern  . Not on file  Social History Narrative  . Not on file    Family History  Problem Relation Age of Onset  . Hypertension Mother   . Hyperlipidemia Mother   . Diabetes Mother   . Asthma Unknown   . Colon cancer Neg Hx       Past Surgical History:  Procedure Laterality Date  . ABDOMINAL HYSTERECTOMY    . BUNIONECTOMY  both big toe  . CESAREAN SECTION  x2  . FOOT SURGERY    . KNEE SURGERY    . SHOULDER ARTHROSCOPY    . TUMOR REMOVAL  left knee cap    Current Outpatient Medications  Medication Sig Dispense Refill  . albuterol (VENTOLIN HFA) 108 (90 BASE) MCG/ACT inhaler Inhale 2 puffs into the lungs every 6 (six) hours as needed. For shortness of breath/asthma/bronchitis     . amLODipine (NORVASC) 5 MG tablet Take 5 mg by mouth daily.    . baclofen (LIORESAL) 10 MG tablet Take 10 mg by mouth 2 (two) times daily. Pt takes 2 tablets to equal 20 mg    . diclofenac (VOLTAREN) 75 MG EC tablet Take 1 tablet (75 mg total) by mouth 2 (two) times daily. 60 tablet 0  . ibuprofen (ADVIL,MOTRIN) 800 MG tablet Take 800 mg by mouth daily.    . pantoprazole (PROTONIX) 40 MG tablet Take 40 mg by mouth daily.     Marland Kitchen tiZANidine (ZANAFLEX) 2 MG tablet Take 3 (three) times daily by mouth.    . topiramate (TOPAMAX) 25 MG tablet Take 100 mg by mouth daily.     No current facility-administered medications for this visit.     Allergies as of 07/30/2017 - Review Complete 07/30/2017  Allergen Reaction Noted  . Penicillins Hives   . Azithromycin Rash 10/24/2013  . Sulfamethoxazole-trimethoprim Hives and Rash 03/16/2014    Vitals: BP 117/77   Pulse 67   Ht 5\' 2"  (1.575 m)   Wt 140 lb (63.5 kg)   BMI 25.61 kg/m  Last Weight:  Wt Readings from Last 1 Encounters:  07/30/17 140 lb (63.5 kg)   ZOX:WRUE mass index  is 25.61 kg/m.     Last Height:   Ht Readings from Last 1 Encounters:  07/30/17 5\' 2"  (1.575 m)    Physical exam:  General: The patient is awake, alert and appears not in acute distress. The patient is well groomed. Head: Normocephalic,  atraumatic. Neck is supple. Mallampati 1-2 - wide open   neck circumference 13 . Nasal airflow congested , Retrognathia is seen.  Cardiovascular:  Regular rate and rhythm , without  murmurs or carotid bruit, and without distended neck veins. Respiratory: Lungs are clear to auscultation. Skin:  Without evidence of edema, or rash- tongue is pierced  Trunk: BMI is 25.6 . The patient's posture is slouched.  Neurologic exam :The patient is awake and alert, oriented to place and time.  Memory subjective  described as impaired -Memory testing deferred, patient is illiterate  Attention span & concentration ability appears limited. Speech is fluent, pressured,  Without dysarthria, dysphonia or aphasia.  Mood and affect are appropriate.  Cranial nerves:Pupils are equal and briskly reactive to light. Funduscopic exam without evidence of pallor or edema. Extraocular movements  in vertical and horizontal planes intact and without nystagmus. Visual fields by finger perimetry are intact. Hearing to finger rub intact.  Facial sensation intact to fine touch. Facial motor strength is symmetric and tongue and uvula move midline. Shoulder shrug was symmetrical.   Motor exam:   Normal tone, muscle bulk and symmetric strength in all extremities. right hip pain, ROM restricted.   Sensory:  Fine touch, pinprick and vibration were tested in all extremities. Proprioception tested in the upper extremities was normal. Coordination: Rapid alternating movements in the fingers/hands was normal. Finger-to-nose maneuver  normal without evidence of ataxia, dysmetria or tremor. Gait and station: Patient walks without assistive device . Turns with 3-4  Steps. Deep tendon reflexes: in the   upper and lower extremities are symmetric and intact. Babinski maneuver response is downgoing.   Dear Dr. Legrand Rams,   Assessment:  After physical and neurologic examination, Personal review of reports of other /same pre-existing records as far as provided in visit., my assessment is   1) no evidence of an organic sleep disorder.  The patient has failed to establish sleep routines and basic sleep hygiene.  She tends to eat and be active at night, watches TV all night long and is exposed to screen light all night.  2) my advice is to establish better sleep habits, which can be difficult if the patient is severely depressed or hypomanic.  I will give her a booklet about insomnia.  I would appreciate if her primary care physician will also educate her in sleep hygiene.  The patient at this time would not need an attended sleep study as it has low yield to place the patient in the sleep lab that has no established sleeping time.  3) Dr. Legrand Rams , please refer to psychiatry as you had stated in your 01-2017 record with the patient.   The patient was advised of the nature of the diagnosed disorder , the treatment options and the  risks for general health and wellness arising from not treating the condition.   I spent more than 45 minutes of face to face time with the patient, who was not cooperative Greater than 50% of time was spent in counseling and coordination of care. We have discussed the diagnosis and differential and I answered the patient's questions.    Plan:  Treatment plan and additional workup :  PCP to refer to behavior health, cognitive behavior therapy.  No benzodiazepines form here, patient will need her psychiatrist to prescribe anxiolytic/ antidepressant medication.  I asked the patient to try melatonin, take the TV out of her bedroom, and avoid daytime naps.     Larey Seat, MD 07/30/2017, 10:06  AM  Certified in Neurology by ABPN Certified in [place] by [facility]  [facility] [address] [place], [place] [address]

## 2017-07-30 NOTE — Patient Instructions (Signed)
Please remember to try to maintain good sleep hygiene, which means: Keep a regular sleep and wake schedule, try not to exercise or have a meal within 2 hours of your bedtime, try to keep your bedroom conducive for sleep, that is, cool and dark, without light distractors such as an illuminated alarm clock, and refrain from watching TV right before sleep or in the middle of the night and do not keep the TV or radio on during the night. Also, try not to use or play on electronic devices at bedtime, such as your cell phone, tablet PC or laptop. If you like to read at bedtime on an electronic device, try to dim the background light as much as possible. Do not eat in the middle of the night.   We will request a sleep study once you have made these adjustments.   For chronic insomnia, you are best followed by a psychiatrist and/or sleep psychologist. I ask you to follow with behavior health first before you return for a sleep study.

## 2017-08-10 ENCOUNTER — Ambulatory Visit (HOSPITAL_COMMUNITY): Payer: Medicare Other

## 2017-08-15 ENCOUNTER — Encounter (HOSPITAL_COMMUNITY): Payer: Self-pay

## 2017-08-15 ENCOUNTER — Ambulatory Visit (HOSPITAL_COMMUNITY)
Admission: RE | Admit: 2017-08-15 | Discharge: 2017-08-15 | Disposition: A | Discharge status: Home / Self Care | Payer: Medicare Other | Source: Ambulatory Visit | Attending: Internal Medicine | Admitting: Internal Medicine

## 2017-08-15 DIAGNOSIS — Z1231 Encounter for screening mammogram for malignant neoplasm of breast: Secondary | ICD-10-CM | POA: Diagnosis not present

## 2017-10-22 DIAGNOSIS — R569 Unspecified convulsions: Secondary | ICD-10-CM | POA: Diagnosis not present

## 2017-10-22 DIAGNOSIS — M1990 Unspecified osteoarthritis, unspecified site: Secondary | ICD-10-CM | POA: Diagnosis not present

## 2017-10-22 DIAGNOSIS — J410 Simple chronic bronchitis: Secondary | ICD-10-CM | POA: Diagnosis not present

## 2017-10-22 DIAGNOSIS — I10 Essential (primary) hypertension: Secondary | ICD-10-CM | POA: Diagnosis not present

## 2017-11-05 ENCOUNTER — Other Ambulatory Visit (HOSPITAL_COMMUNITY): Payer: Self-pay | Admitting: Internal Medicine

## 2017-11-05 ENCOUNTER — Ambulatory Visit (HOSPITAL_COMMUNITY)
Admission: RE | Admit: 2017-11-05 | Discharge: 2017-11-05 | Disposition: A | Discharge status: Home / Self Care | Payer: Medicare Other | Source: Ambulatory Visit | Attending: Internal Medicine | Admitting: Internal Medicine

## 2017-11-05 DIAGNOSIS — M545 Low back pain: Secondary | ICD-10-CM

## 2017-11-05 DIAGNOSIS — I10 Essential (primary) hypertension: Secondary | ICD-10-CM | POA: Diagnosis not present

## 2017-11-05 DIAGNOSIS — M5136 Other intervertebral disc degeneration, lumbar region: Secondary | ICD-10-CM | POA: Insufficient documentation

## 2017-11-05 DIAGNOSIS — M549 Dorsalgia, unspecified: Secondary | ICD-10-CM | POA: Diagnosis not present

## 2017-11-05 DIAGNOSIS — L089 Local infection of the skin and subcutaneous tissue, unspecified: Secondary | ICD-10-CM | POA: Diagnosis not present

## 2017-11-05 DIAGNOSIS — Z131 Encounter for screening for diabetes mellitus: Secondary | ICD-10-CM | POA: Diagnosis not present

## 2017-11-05 DIAGNOSIS — R739 Hyperglycemia, unspecified: Secondary | ICD-10-CM | POA: Diagnosis not present

## 2017-11-05 DIAGNOSIS — M48061 Spinal stenosis, lumbar region without neurogenic claudication: Secondary | ICD-10-CM | POA: Diagnosis not present

## 2017-11-06 ENCOUNTER — Other Ambulatory Visit (HOSPITAL_COMMUNITY)
Admission: RE | Admit: 2017-11-06 | Discharge: 2017-11-06 | Disposition: A | Discharge status: Home / Self Care | Payer: Medicare Other | Source: Ambulatory Visit | Attending: Internal Medicine | Admitting: Internal Medicine

## 2017-11-06 DIAGNOSIS — R69 Illness, unspecified: Secondary | ICD-10-CM | POA: Diagnosis not present

## 2017-11-06 LAB — POTASSIUM: Potassium: 3.6 mmol/L (ref 3.5–5.1)

## 2017-11-12 ENCOUNTER — Ambulatory Visit (HOSPITAL_COMMUNITY)
Admission: RE | Admit: 2017-11-12 | Discharge: 2017-11-12 | Disposition: A | Discharge status: Home / Self Care | Payer: Medicare Other | Source: Ambulatory Visit | Attending: Internal Medicine | Admitting: Internal Medicine

## 2017-11-12 DIAGNOSIS — M5126 Other intervertebral disc displacement, lumbar region: Secondary | ICD-10-CM | POA: Insufficient documentation

## 2017-11-12 DIAGNOSIS — M549 Dorsalgia, unspecified: Secondary | ICD-10-CM | POA: Diagnosis not present

## 2017-11-12 DIAGNOSIS — M545 Low back pain: Secondary | ICD-10-CM

## 2018-01-31 DIAGNOSIS — I10 Essential (primary) hypertension: Secondary | ICD-10-CM | POA: Diagnosis not present

## 2018-01-31 DIAGNOSIS — M1990 Unspecified osteoarthritis, unspecified site: Secondary | ICD-10-CM | POA: Diagnosis not present

## 2018-01-31 DIAGNOSIS — E559 Vitamin D deficiency, unspecified: Secondary | ICD-10-CM | POA: Diagnosis not present

## 2018-01-31 DIAGNOSIS — R569 Unspecified convulsions: Secondary | ICD-10-CM | POA: Diagnosis not present

## 2018-02-25 ENCOUNTER — Other Ambulatory Visit: Payer: Medicare Other | Admitting: Obstetrics and Gynecology

## 2018-04-01 ENCOUNTER — Other Ambulatory Visit: Payer: Self-pay

## 2018-04-01 ENCOUNTER — Encounter: Payer: Self-pay | Admitting: Obstetrics and Gynecology

## 2018-04-01 ENCOUNTER — Ambulatory Visit (INDEPENDENT_AMBULATORY_CARE_PROVIDER_SITE_OTHER): Payer: Medicare Other | Admitting: Obstetrics and Gynecology

## 2018-04-01 ENCOUNTER — Encounter (INDEPENDENT_AMBULATORY_CARE_PROVIDER_SITE_OTHER): Payer: Self-pay

## 2018-04-01 VITALS — BP 113/73 | HR 55 | Ht 63.0 in | Wt 151.6 lb

## 2018-04-01 DIAGNOSIS — Z1211 Encounter for screening for malignant neoplasm of colon: Secondary | ICD-10-CM | POA: Diagnosis not present

## 2018-04-01 DIAGNOSIS — Z01419 Encounter for gynecological examination (general) (routine) without abnormal findings: Secondary | ICD-10-CM

## 2018-04-01 DIAGNOSIS — Z1212 Encounter for screening for malignant neoplasm of rectum: Secondary | ICD-10-CM | POA: Diagnosis not present

## 2018-04-01 LAB — HEMOCCULT GUIAC POC 1CARD (OFFICE): Fecal Occult Blood, POC: NEGATIVE

## 2018-04-01 MED ORDER — ESTRADIOL 1 MG PO TABS: 1.0000 mg | ORAL_TABLET | Freq: Every day | ORAL | 11 refills | Status: DC

## 2018-04-01 NOTE — Addendum Note (Signed)
Addended by: Gaylyn Rong A on: 04/01/2018 09:56 AM   Modules accepted: Orders

## 2018-04-01 NOTE — Progress Notes (Signed)
Patient ID: Kirsten Walls, female   DOB: 1966-07-10, 52 y.o.   MRN: [id number]   Assessment:  Annual Gyn Exam Plan:  1. pap smear not done 2. return annually or prn 3    Annual mammogram advised after age 38 4.   Rx  Estradiol 1mg  Subjective:  Kirsten Walls is a 52 y.o. female [phone] who presents for annual exam. No LMP recorded. Patient has had a hysterectomy. The patient notes that she still has night sweats and it affects her "a lot" and has trouble sleeping at night. She notes that she has her air on and it still doesn't help with the sweating. She denies having heart disease. She notes that she has pains in her feet and has to massage them to better the pain but denies being diagnosed with blood clots.  The following portions of the patient's history were reviewed and updated as appropriate: allergies, current medications, past family history, past medical history, past social history, past surgical history and problem list. Past Medical History:  Diagnosis Date  . Arthritis   . Asthma   . Bronchitis   . Chronic shoulder pain   . Hypertension   . Insomnia   . Seizures ([place])     Past Surgical History:  Procedure Laterality Date  . ABDOMINAL HYSTERECTOMY    . BUNIONECTOMY  both big toe  . CESAREAN SECTION  x2  . COLONOSCOPY N/A 08/18/2015   Procedure: COLONOSCOPY;  Surgeon: Daneil Dolin, MD;  [facility];  Service: Endoscopy;  Laterality: N/A;  1000  . FOOT SURGERY    . KNEE SURGERY    . SHOULDER ARTHROSCOPY    . TUMOR REMOVAL  left knee cap     Current Outpatient Medications:  .  albuterol (VENTOLIN HFA) 108 (90 BASE) MCG/ACT inhaler, Inhale 2 puffs into the lungs every 6 (six) hours as needed. For shortness of breath/asthma/bronchitis , Disp: , Rfl:  .  amLODipine (NORVASC) 5 MG tablet, Take 5 mg by mouth daily., Disp: , Rfl:  .  baclofen (LIORESAL) 10 MG tablet, Take 10 mg by mouth 2 (two) times daily. Pt takes 2 tablets to equal 20 mg, Disp: , Rfl:   .  diclofenac (VOLTAREN) 75 MG EC tablet, Take 1 tablet (75 mg total) by mouth 2 (two) times daily., Disp: 60 tablet, Rfl: 0 .  ibuprofen (ADVIL,MOTRIN) 800 MG tablet, Take 800 mg by mouth daily., Disp: , Rfl:  .  Melatonin 3 MG TABS, Take 1 tablet (3 mg total) at bedtime by mouth., Disp: , Rfl: 0 .  pantoprazole (PROTONIX) 40 MG tablet, Take 40 mg by mouth daily. , Disp: , Rfl:  .  tiZANidine (ZANAFLEX) 2 MG tablet, Take 3 (three) times daily by mouth., Disp: , Rfl:  .  topiramate (TOPAMAX) 25 MG tablet, Take 100 mg by mouth daily., Disp: , Rfl:   Review of Systems Constitutional: negative Gastrointestinal: negative Genitourinary: Normal  Objective:  There were no vitals taken for this visit.   BMI: There is no height or weight on file to calculate BMI.  General Appearance: Alert, appropriate appearance for age. No acute distress HEENT: Grossly normal Neck / Thyroid:  Cardiovascular: RRR; normal S1, S2, no murmur Lungs: CTA bilaterally Back: No CVAT Breast Exam: No masses or nodes.No dimpling, nipple retraction or discharge, tissues normal. She had clogged breast duct and biopsy done rt [facility]  when she had one of her children Gastrointestinal: Soft, non-tender, no masses or organomegaly Pelvic Exam:  VULVA: normal appearing vulva with no masses, tenderness or lesions VAGINA: normal appearing vagina with normal color and discharge, no lesions CERVIX: normal appearing cervix without discharge or lesions, UTERUS: uterus is normal size, shape, consistency and nontender,  RECTAL: non-tender normal rectal, no masses, guaiac negative stool obtained, PAP: Pap smear not performed. Lymphatic Exam: Non-palpable nodes in neck, clavicular, axillary, or inguinal regions Skin: no rash or abnormalities Neurologic: Normal gait and speech, no tremor  Psychiatric: Alert and oriented, appropriate affect.  Urinalysis:Not done  By signing my name below, I, Kirsten Walls, attest that this documentation  has been prepared under the direction and in the presence of Jonnie Kind, MD. Electronically Signed: [place]. 04/01/18. 9:01 AM.  I personally performed the services described in this documentation, which was SCRIBED in my presence. The recorded information has been reviewed and considered accurate. It has been edited as necessary during review. Jonnie Kind, MD

## 2018-04-04 DIAGNOSIS — L03113 Cellulitis of right upper limb: Secondary | ICD-10-CM | POA: Diagnosis not present

## 2018-04-04 DIAGNOSIS — L259 Unspecified contact dermatitis, unspecified cause: Secondary | ICD-10-CM | POA: Diagnosis not present

## 2018-04-15 DIAGNOSIS — Z1231 Encounter for screening mammogram for malignant neoplasm of breast: Secondary | ICD-10-CM | POA: Diagnosis not present

## 2018-05-02 DIAGNOSIS — M1990 Unspecified osteoarthritis, unspecified site: Secondary | ICD-10-CM | POA: Diagnosis not present

## 2018-05-02 DIAGNOSIS — R569 Unspecified convulsions: Secondary | ICD-10-CM | POA: Diagnosis not present

## 2018-05-02 DIAGNOSIS — I10 Essential (primary) hypertension: Secondary | ICD-10-CM | POA: Diagnosis not present

## 2018-05-08 NOTE — Congregational Nurse Program (Signed)
Congregational Nurse Program Note  Date of Encounter: 05/08/2018  Past Medical History: Past Medical History:  Diagnosis Date  . Arthritis   . Asthma   . Bronchitis   . Chronic shoulder pain   . Hypertension   . Insomnia   . Seizures [facility])     Encounter Details: CNP Questionnaire - 05/08/18 0941      Questionnaire   Patient Status  Not Applicable    Race  Black or African American    Location Patient Served At  [employer], [employer]    Uninsured  Not Applicable    Food  No food insecurities    Housing/Utilities  Yes, have permanent housing    Transportation  No transportation needs    Interpersonal Safety  Yes, feel physically and emotionally safe where you currently live    Medication  No medication insecurities    Medical Provider  Yes    Referrals  Not Applicable    ED Visit Averted  Not Applicable    Life-Saving Intervention Made  Not Applicable      Seen at the food pantry. No concerns B [address] RN, [place] [facility] [phone]

## 2018-06-20 DIAGNOSIS — R42 Dizziness and giddiness: Secondary | ICD-10-CM | POA: Diagnosis not present

## 2018-06-20 DIAGNOSIS — Z0000 Encounter for general adult medical examination without abnormal findings: Secondary | ICD-10-CM | POA: Diagnosis not present

## 2018-06-20 DIAGNOSIS — R739 Hyperglycemia, unspecified: Secondary | ICD-10-CM | POA: Diagnosis not present

## 2018-06-20 DIAGNOSIS — M1990 Unspecified osteoarthritis, unspecified site: Secondary | ICD-10-CM | POA: Diagnosis not present

## 2018-06-20 DIAGNOSIS — E7800 Pure hypercholesterolemia, unspecified: Secondary | ICD-10-CM | POA: Diagnosis not present

## 2018-06-20 DIAGNOSIS — J40 Bronchitis, not specified as acute or chronic: Secondary | ICD-10-CM | POA: Diagnosis not present

## 2018-06-20 DIAGNOSIS — R569 Unspecified convulsions: Secondary | ICD-10-CM | POA: Diagnosis not present

## 2018-06-20 DIAGNOSIS — M25511 Pain in right shoulder: Secondary | ICD-10-CM | POA: Diagnosis not present

## 2018-06-20 DIAGNOSIS — R197 Diarrhea, unspecified: Secondary | ICD-10-CM | POA: Diagnosis not present

## 2018-06-20 DIAGNOSIS — E785 Hyperlipidemia, unspecified: Secondary | ICD-10-CM | POA: Diagnosis not present

## 2018-06-20 DIAGNOSIS — E559 Vitamin D deficiency, unspecified: Secondary | ICD-10-CM | POA: Diagnosis not present

## 2018-06-20 DIAGNOSIS — E538 Deficiency of other specified B group vitamins: Secondary | ICD-10-CM | POA: Diagnosis not present

## 2018-06-20 DIAGNOSIS — I10 Essential (primary) hypertension: Secondary | ICD-10-CM | POA: Diagnosis not present

## 2018-07-03 ENCOUNTER — Ambulatory Visit: Payer: Medicare Other | Admitting: Obstetrics and Gynecology

## 2018-08-06 ENCOUNTER — Ambulatory Visit (INDEPENDENT_AMBULATORY_CARE_PROVIDER_SITE_OTHER): Payer: Medicare Other | Admitting: Adult Health

## 2018-08-06 ENCOUNTER — Encounter: Payer: Self-pay | Admitting: Adult Health

## 2018-08-06 VITALS — BP 119/72 | HR 55 | Ht 62.0 in | Wt 138.0 lb

## 2018-08-06 DIAGNOSIS — N951 Menopausal and female climacteric states: Secondary | ICD-10-CM | POA: Diagnosis not present

## 2018-08-06 NOTE — Progress Notes (Signed)
  Subjective:     Patient ID: Kirsten Walls, female   DOB: 06-01-1966, 52 y.o.   MRN: [id number]  HPI Kirsten Walls is a 52 year old black female, sp hysterectomy back in follow up on starting estrace in July for hot flashes and she stopped them, says the pills made hot flashes worse.  PCP is Dr Legrand Rams.   Review of Systems +hot flashes,but stopped estrace it made them worse Patient denies any headaches, hearing loss, fatigue, blurred vision, shortness of breath, chest pain, abdominal pain, problems with bowel movements, urination, or intercourse. No joint pain or mood swings. Reviewed past medical,surgical, social and family history. Reviewed medications and allergies.     Objective:   Physical Exam BP 119/72 (BP Location: Left Arm, Patient Position: Sitting, Cuff Size: Normal)   Pulse (!) 55   Ht 5\' 2"  (1.575 m)   Wt 138 lb (62.6 kg)   BMI 25.24 kg/m  Skin warm and dry. Lungs: clear to ausculation bilaterally. Cardiovascular: regular rate and rhythm. You do not have to have ET, and she threw pills in trash.     Assessment:     1. Hot flashes due to menopause       Plan:     F/U prn

## 2018-11-20 ENCOUNTER — Ambulatory Visit: Payer: Medicare Other | Admitting: Orthopedic Surgery

## 2018-12-02 ENCOUNTER — Ambulatory Visit: Payer: Medicare Other | Admitting: Orthopedic Surgery

## 2019-01-15 ENCOUNTER — Other Ambulatory Visit: Payer: Self-pay

## 2019-01-15 ENCOUNTER — Encounter (HOSPITAL_COMMUNITY): Payer: Self-pay | Admitting: Emergency Medicine

## 2019-01-15 ENCOUNTER — Emergency Department (HOSPITAL_COMMUNITY)
Admission: EM | Admit: 2019-01-15 | Discharge: 2019-01-15 | Disposition: A | Discharge status: Home / Self Care | Payer: Medicare Other | Attending: Emergency Medicine | Admitting: Emergency Medicine

## 2019-01-15 DIAGNOSIS — T63441A Toxic effect of venom of bees, accidental (unintentional), initial encounter: Secondary | ICD-10-CM | POA: Diagnosis present

## 2019-01-15 DIAGNOSIS — T7840XA Allergy, unspecified, initial encounter: Secondary | ICD-10-CM | POA: Diagnosis not present

## 2019-01-15 MED ORDER — METHYLPREDNISOLONE SODIUM SUCC 125 MG IJ SOLR
125.0000 mg | Freq: Once | INTRAMUSCULAR | Status: AC
  Administered 2019-01-15: 19:00:00 125 mg via INTRAVENOUS
  Filled 2019-01-15: qty 2

## 2019-01-15 MED ORDER — SODIUM CHLORIDE 0.9 % IV SOLN: INTRAVENOUS | Status: DC

## 2019-01-15 MED ORDER — FAMOTIDINE IN NACL 20-0.9 MG/50ML-% IV SOLN
20.0000 mg | Freq: Once | INTRAVENOUS | Status: AC
  Administered 2019-01-15: 20 mg via INTRAVENOUS
  Filled 2019-01-15: qty 50

## 2019-01-15 MED ORDER — PREDNISONE 10 MG PO TABS: 40.0000 mg | ORAL_TABLET | Freq: Every day | ORAL | 0 refills | Status: DC

## 2019-01-15 MED ORDER — FAMOTIDINE 20 MG PO TABS: 20.0000 mg | ORAL_TABLET | Freq: Two times a day (BID) | ORAL | 0 refills | Status: DC

## 2019-01-15 MED ORDER — EPINEPHRINE 0.3 MG/0.3ML IJ SOAJ: 0.3000 mg | INTRAMUSCULAR | 0 refills | Status: AC | PRN

## 2019-01-15 NOTE — ED Notes (Signed)
Pt given sprite to drink, was able to keep fluids down without any problems.

## 2019-01-15 NOTE — ED Provider Notes (Signed)
[facility] EMERGENCY DEPARTMENT Provider Note   CSN: [id number] Arrival date & time: 01/15/19  1729    History   Chief Complaint Chief Complaint  Patient presents with  . Insect Bite    HPI Kirsten Walls is a 53 y.o. female.     Patient status post a wasp sting or hornet sting to her left inner thigh occurred shortly prior to arrival.  Patient started to itch all over and her throat started to get tight.  She took a Benadryl at home 25 mg and also EMS gave her an EpiPen.  Patient with some improvement upon arrival but still felt tight in the throat.  No wheezing.  Patient without any known prior history to allergies to bee stings.  Patient was only stung once.     Past Medical History:  Diagnosis Date  . Arthritis   . Asthma   . Bronchitis   . Chronic shoulder pain   . Hypertension   . Insomnia   . Seizures [facility])     Patient Active Problem List   Diagnosis Date Noted  . Insomnia related to another mental disorder 07/30/2017  . Chronic insomnia 07/30/2017  . History of colonic polyps   . Diverticulosis of colon without hemorrhage   . Encounter for screening colonoscopy 07/27/2015  . Polypharmacy 07/27/2015  . Arthritis, shoulder region 04/27/2014  . Acute bursitis of right shoulder 02/16/2014  . Shoulder injury 02/16/2014  . 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 BUNION 03/17/2009  . H N P-LUMBAR 06/03/2008  . KNEE, ARTHRITIS, DEGEN./OSTEO 02/11/2008  . NEOPLASMS UNSPEC NATURE BONE SOFT TISSUE&SKIN 02/03/2008  . LOW BACK PAIN 02/03/2008  . DERANGEMENT MENISCUS 01/23/2008  . JOINT EFFUSION, LEFT KNEE 01/23/2008  . TEAR MEDIAL MENISCUS 01/23/2008    Past Surgical History:  Procedure Laterality Date  . ABDOMINAL HYSTERECTOMY    . BUNIONECTOMY  both big toe  . CESAREAN SECTION  x2  . COLONOSCOPY N/A 08/18/2015   Procedure: COLONOSCOPY;  Surgeon: Daneil Dolin, MD;  [facility];  Service: Endoscopy;  Laterality: N/A;  1000  . FOOT SURGERY     . KNEE SURGERY    . SHOULDER ARTHROSCOPY    . TUMOR REMOVAL  left knee cap     OB History    Gravida  4   Para  4   Term  4   Preterm      AB      Living  4     SAB      TAB      Ectopic      Multiple      Live Births               Home Medications    Prior to Admission medications   Medication Sig Start Date End Date Taking? Authorizing Provider  albuterol (VENTOLIN HFA) 108 (90 BASE) MCG/ACT inhaler Inhale 2 puffs into the lungs every 6 (six) hours as needed. For shortness of breath/asthma/bronchitis     [provider]  amLODipine (NORVASC) 5 MG tablet Take 5 mg by mouth daily.    [provider]  baclofen (LIORESAL) 10 MG tablet Take 10 mg by mouth 2 (two) times daily. Pt takes 2 tablets to equal 20 mg    [provider]  diclofenac (VOLTAREN) 75 MG EC tablet Take 1 tablet (75 mg total) by mouth 2 (two) times daily. Patient not taking: Reported on 08/06/2018 02/05/17   Edrick Kins, DPM  ibuprofen (  ADVIL,MOTRIN) 800 MG tablet Take 800 mg by mouth daily.    [provider]  Melatonin 3 MG TABS Take 1 tablet (3 mg total) at bedtime by mouth. 07/30/17   Dohmeier, Asencion Partridge, MD  pantoprazole (PROTONIX) 40 MG tablet Take 40 mg by mouth daily.  02/11/15   [provider]  tiZANidine (ZANAFLEX) 2 MG tablet Take 3 (three) times daily by mouth.    [provider]  topiramate (TOPAMAX) 25 MG tablet Take 100 mg by mouth daily.    [provider]    Family History Family History  Problem Relation Age of Onset  . Hypertension Mother   . Hyperlipidemia Mother   . Diabetes Mother   . Asthma Other   . Colon cancer Neg Hx     Social History Social History   Tobacco Use  . Smoking status: Light Tobacco Smoker    Packs/day: 0.00    Years: 30.00    Pack years: 0.00    Types: Cigarettes  . Smokeless tobacco: Never Used  . Tobacco comment: social  Substance Use Topics  . Alcohol use: Yes    Comment:  social  . Drug use: No     Allergies   Penicillins; Azithromycin; and Sulfamethoxazole-trimethoprim   Review of Systems Review of Systems  Constitutional: Negative for chills and fever.  HENT: Positive for trouble swallowing. Negative for congestion, rhinorrhea and sore throat.   Eyes: Negative for pain and visual disturbance.  Respiratory: Negative for cough, shortness of breath and wheezing.   Cardiovascular: Negative for chest pain and leg swelling.  Gastrointestinal: Negative for abdominal pain, diarrhea, nausea and vomiting.  Genitourinary: Negative for dysuria.  Musculoskeletal: Negative for back pain and neck pain.  Skin: Positive for wound. Negative for rash.  Neurological: Negative for dizziness, light-headedness and headaches.  Hematological: Does not bruise/bleed easily.  Psychiatric/Behavioral: Negative for confusion.     Physical Exam Updated Vital Signs BP (!) 146/99 (BP Location: Left Arm)   Pulse 94   Temp 98.9 F (37.2 C) (Oral)   Resp 18   Ht 1.575 m (5\' 2" )   Wt 62.6 kg   SpO2 97%   BMI 25.24 kg/m   Physical Exam Vitals signs and nursing note reviewed.  Constitutional:      General: She is not in acute distress.    Appearance: Normal appearance. She is well-developed. She is not ill-appearing or toxic-appearing.  HENT:     Head: Normocephalic and atraumatic.     Mouth/Throat:     Mouth: Mucous membranes are moist.     Comments: Tongue swelling no swelling to the lips.  Patient able to talk fine. Eyes:     Extraocular Movements: Extraocular movements intact.     Conjunctiva/sclera: Conjunctivae normal.     Pupils: Pupils are equal, round, and reactive to light.  Neck:     Musculoskeletal: Neck supple.  Cardiovascular:     Rate and Rhythm: Normal rate and regular rhythm.     Heart sounds: No murmur.  Pulmonary:     Effort: Pulmonary effort is normal. No respiratory distress.     Breath sounds: Normal breath sounds. No wheezing.  Abdominal:      General: Bowel sounds are normal.     Palpations: Abdomen is soft.     Tenderness: There is no abdominal tenderness.  Musculoskeletal: Normal range of motion.     Comments: Of the also staying to the left inner thigh with an area of erythema measuring approximately 5  cm.  With bit of a welt.  No other hives anywhere else.  Skin:    General: Skin is warm and dry.     Capillary Refill: Capillary refill takes less than 2 seconds.  Neurological:     General: No focal deficit present.     Mental Status: She is alert and oriented to person, place, and time.      ED Treatments / Results  Labs (all labs ordered are listed, but only abnormal results are displayed) Labs Reviewed - No data to display  EKG None  Radiology No results found.  Procedures Procedures (including critical care time)  CRITICAL CARE Performed by: Fredia Sorrow Total critical care time: 30 minutes Critical care time was exclusive of separately billable procedures and treating other patients. Critical care was necessary to treat or prevent imminent or life-threatening deterioration. Critical care was time spent personally by me on the following activities: development of treatment plan with patient and/or surrogate as well as nursing, discussions with consultants, evaluation of patient's response to treatment, examination of patient, obtaining history from patient or surrogate, ordering and performing treatments and interventions, ordering and review of laboratory studies, ordering and review of radiographic studies, pulse oximetry and re-evaluation of patient's condition.   Medications Ordered in ED Medications  0.9 %  sodium chloride infusion (has no administration in time range)  famotidine (PEPCID) IVPB 20 mg premix (has no administration in time range)  methylPREDNISolone sodium succinate (SOLU-MEDROL) 125 mg/2 mL injection 125 mg (has no administration in time range)     Initial Impression /  Assessment and Plan / ED Course  I have reviewed the triage vital signs and the nursing notes.  Pertinent labs & imaging results that were available during my care of the patient were reviewed by me and considered in my medical decision making (see chart for details).       Patient improving significantly upon arrival to the emergency department from the allergic reaction from the wall sting.  Patient received Solu-Medrol and IV Pepcid Sherry had Benadryl and EpiPen.  Patient now feeling much better.  Her throat no longer feels tight.  No wheezing no tongue swelling.  No lip swelling.  Patient will be continued on prednisone for the next 5 days Pepcid for the next 7 days she will take Benadryl for the next 24 hours and she is given a new prescription for EpiPen's.  Patient will return for any new or worse symptoms   Final Clinical Impressions(s) / ED Diagnoses   Final diagnoses:  None    ED Discharge Orders    None       Fredia Sorrow, MD 01/15/19 2045

## 2019-01-15 NOTE — Discharge Instructions (Signed)
Take Benadryl 25 mg every 6 hours for the next 24 hours.  Take the Pepcid as directed for the next 7 days.  And take the prednisone as directed for the next 5 days.  Prescription for EpiPen was provided.  Keep one at home and wanting your pocketbook.  Return for any new or worse symptoms.  The area where the wasp stung you will be swollen for a few days and will be read that is normal.  Return for any difficulty breathing swelling of the tongue lips or tightening of the throat.

## 2019-01-15 NOTE — ED Triage Notes (Signed)
Per EMS patient was stung on her left inner thigh by wasp and had an immediate allergic reaction with hives, difficulty breathing, wheezing, and trouble talking due to throat swelling. Patient was given 50 mg benadryl IV and 0.3 mg of epinephrine.

## 2019-04-25 DIAGNOSIS — Z1231 Encounter for screening mammogram for malignant neoplasm of breast: Secondary | ICD-10-CM | POA: Diagnosis not present

## 2019-08-06 ENCOUNTER — Other Ambulatory Visit: Payer: Self-pay

## 2019-08-06 ENCOUNTER — Emergency Department (HOSPITAL_COMMUNITY)
Admission: EM | Admit: 2019-08-06 | Discharge: 2019-08-07 | Disposition: A | Discharge status: Home / Self Care | Payer: Medicare Other | Attending: Emergency Medicine | Admitting: Emergency Medicine

## 2019-08-06 ENCOUNTER — Encounter (HOSPITAL_COMMUNITY): Payer: Self-pay | Admitting: Emergency Medicine

## 2019-08-06 DIAGNOSIS — F17210 Nicotine dependence, cigarettes, uncomplicated: Secondary | ICD-10-CM | POA: Diagnosis not present

## 2019-08-06 DIAGNOSIS — I10 Essential (primary) hypertension: Secondary | ICD-10-CM | POA: Insufficient documentation

## 2019-08-06 DIAGNOSIS — R040 Epistaxis: Secondary | ICD-10-CM | POA: Insufficient documentation

## 2019-08-06 DIAGNOSIS — J45909 Unspecified asthma, uncomplicated: Secondary | ICD-10-CM | POA: Insufficient documentation

## 2019-08-06 DIAGNOSIS — Z79899 Other long term (current) drug therapy: Secondary | ICD-10-CM | POA: Insufficient documentation

## 2019-08-06 LAB — CBC WITH DIFFERENTIAL/PLATELET
Abs Immature Granulocytes: 0.04 10*3/uL (ref 0.00–0.07)
Basophils Absolute: 0 10*3/uL (ref 0.0–0.1)
Basophils Relative: 0 %
Eosinophils Absolute: 0 10*3/uL (ref 0.0–0.5)
Eosinophils Relative: 0 %
HCT: 35.8 % — ABNORMAL LOW (ref 36.0–46.0)
Hemoglobin: 12 g/dL (ref 12.0–15.0)
Immature Granulocytes: 0 %
Lymphocytes Relative: 9 %
Lymphs Abs: 1.3 10*3/uL (ref 0.7–4.0)
MCH: 31.8 pg (ref 26.0–34.0)
MCHC: 33.5 g/dL (ref 30.0–36.0)
MCV: 95 fL (ref 80.0–100.0)
Monocytes Absolute: 0.7 10*3/uL (ref 0.1–1.0)
Monocytes Relative: 5 %
Neutro Abs: 13 10*3/uL — ABNORMAL HIGH (ref 1.7–7.7)
Neutrophils Relative %: 86 %
Platelets: 309 10*3/uL (ref 150–400)
RBC: 3.77 MIL/uL — ABNORMAL LOW (ref 3.87–5.11)
RDW: 13 % (ref 11.5–15.5)
WBC: 15 10*3/uL — ABNORMAL HIGH (ref 4.0–10.5)
nRBC: 0 % (ref 0.0–0.2)

## 2019-08-06 MED ORDER — OXYMETAZOLINE HCL 0.05 % NA SOLN
1.0000 | Freq: Once | NASAL | Status: AC
  Administered 2019-08-06: 22:00:00 1 via NASAL
  Filled 2019-08-06: qty 30

## 2019-08-06 MED ORDER — ACETAMINOPHEN 325 MG PO TABS
650.0000 mg | ORAL_TABLET | Freq: Once | ORAL | Status: AC
  Administered 2019-08-06: 650 mg via ORAL
  Filled 2019-08-06: qty 2

## 2019-08-06 NOTE — Discharge Instructions (Signed)
Take your blood pressure medications as prescribed and follow-up with your doctor as well as the ear nose and throat doctor.  Return to the ED if you have new or worsening symptoms.

## 2019-08-06 NOTE — ED Provider Notes (Signed)
[facility] EMERGENCY DEPARTMENT Provider Note   CSN: [id number] Arrival date & time: 08/06/19  2142     History   Chief Complaint Chief Complaint  Patient presents with  . Epistaxis    HPI Kirsten Walls is a 53 y.o. female.     Patient presents via EMS with nosebleed.  States this started about 9 PM and woke her from sleep.  She went to bed at 7 PM feeling well.  She denies any recent illness, runny nose, sore throat, trauma to her nose, coughing or fever.  States she woke up with a gradual onset frontal headache and noticed that her nose is bleeding as well.  States it was coming from both sides.  EMS states hypertensive on their arrival.  Patient did take an extra dose of her blood pressure medicine before coming in.  She does not take any blood thinners.  Denies any focal weakness, numbness or tingling.  Denies any chest pain or shortness of breath.  Denies any cocaine use.  Has never had a nosebleed like this before.  The history is provided by the patient.  Epistaxis Associated symptoms: headaches   Associated symptoms: no congestion, no cough, no dizziness and no fever     Past Medical History:  Diagnosis Date  . Arthritis   . Asthma   . Bronchitis   . Chronic shoulder pain   . Hypertension   . Insomnia   . Seizures [facility])     Patient Active Problem List   Diagnosis Date Noted  . Insomnia related to another mental disorder 07/30/2017  . Chronic insomnia 07/30/2017  . History of colonic polyps   . Diverticulosis of colon without hemorrhage   . Encounter for screening colonoscopy 07/27/2015  . Polypharmacy 07/27/2015  . Arthritis, shoulder region 04/27/2014  . Acute bursitis of right shoulder 02/16/2014  . Shoulder injury 02/16/2014  . 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 BUNION 03/17/2009  . H N P-LUMBAR 06/03/2008  . KNEE, ARTHRITIS, DEGEN./OSTEO 02/11/2008  . NEOPLASMS UNSPEC NATURE BONE SOFT TISSUE&SKIN 02/03/2008  . LOW BACK PAIN 02/03/2008  .  DERANGEMENT MENISCUS 01/23/2008  . JOINT EFFUSION, LEFT KNEE 01/23/2008  . TEAR MEDIAL MENISCUS 01/23/2008    Past Surgical History:  Procedure Laterality Date  . ABDOMINAL HYSTERECTOMY    . BUNIONECTOMY  both big toe  . CESAREAN SECTION  x2  . COLONOSCOPY N/A 08/18/2015   Procedure: COLONOSCOPY;  Surgeon: Daneil Dolin, MD;  Location: [facility];  Service: Endoscopy;  Laterality: N/A;  1000  . FOOT SURGERY    . KNEE SURGERY    . SHOULDER ARTHROSCOPY    . TUMOR REMOVAL  left knee cap     OB History    Gravida  4   Para  4   Term  4   Preterm      AB      Living  4     SAB      TAB      Ectopic      Multiple      Live Births               Home Medications    Prior to Admission medications   Medication Sig Start Date End Date Taking? Authorizing Provider  albuterol (VENTOLIN HFA) 108 (90 BASE) MCG/ACT inhaler Inhale 2 puffs into the lungs every 6 (six) hours as needed. For shortness of breath/asthma/bronchitis     [provider]  amLODipine (NORVASC) 5 MG  tablet Take 5 mg by mouth daily.    [provider]  baclofen (LIORESAL) 10 MG tablet Take 10 mg by mouth 2 (two) times daily.     [provider]  EPINEPHrine (EPIPEN 2-PAK) 0.3 mg/0.3 mL IJ SOAJ injection Inject 0.3 mLs (0.3 mg total) into the muscle as needed for anaphylaxis. 01/15/19   Fredia Sorrow, MD  famotidine (PEPCID) 20 MG tablet Take 1 tablet (20 mg total) by mouth 2 (two) times daily. 01/15/19   Fredia Sorrow, MD  ibuprofen (ADVIL,MOTRIN) 800 MG tablet Take 800 mg by mouth every 8 (eight) hours as needed for mild pain or moderate pain.     [provider]  pantoprazole (PROTONIX) 40 MG tablet Take 40 mg by mouth daily.  02/11/15   [provider]  predniSONE (DELTASONE) 10 MG tablet Take 4 tablets (40 mg total) by mouth daily. 01/15/19   Fredia Sorrow, MD  topiramate (TOPAMAX) 25 MG tablet Take 100 mg by mouth daily.    [provider]  Vitamin D, Ergocalciferol, (DRISDOL) 1.25 MG (50000 UT) CAPS capsule Take 50,000 Units by mouth every 7 (seven) days.    [provider]    Family History Family History  Problem Relation Age of Onset  . Hypertension Mother   . Hyperlipidemia Mother   . Diabetes Mother   . Asthma Other   . Colon cancer Neg Hx     Social History Social History   Tobacco Use  . Smoking status: Heavy Tobacco Smoker    Packs/day: 0.50    Years: 30.00    Pack years: 15.00    Types: Cigarettes  . Smokeless tobacco: Never Used  . Tobacco comment: social  Substance Use Topics  . Alcohol use: Yes    Comment: social  . Drug use: No     Allergies   Penicillins, Azithromycin, and Sulfamethoxazole-trimethoprim   Review of Systems Review of Systems  Constitutional: Negative for activity change, appetite change, chills and fever.  HENT: Positive for nosebleeds. Negative for congestion.   Eyes: Negative for visual disturbance.  Respiratory: Negative for cough, chest tightness and shortness of breath.   Cardiovascular: Negative for chest pain.  Gastrointestinal: Negative for abdominal pain, nausea and vomiting.  Genitourinary: Negative for dysuria, hematuria, vaginal bleeding and vaginal discharge.  Musculoskeletal: Negative for arthralgias and myalgias.  Skin: Negative for rash.  Neurological: Positive for headaches. Negative for dizziness and light-headedness.   all other systems are negative except as noted in the HPI and PMH.     Physical Exam Updated Vital Signs BP 115/85 (BP Location: Right Arm)   Pulse (!) 126   Temp 99.8 F (37.7 C) (Oral)   Resp 19   Ht 5\' 3"  (1.6 m)   Wt 59 kg   SpO2 96%   BMI 23.03 kg/m   Physical Exam Vitals signs and nursing note reviewed.  Constitutional:      General: She is not in acute distress.    Appearance: She is well-developed.  HENT:     Head: Normocephalic and atraumatic.     Nose:     Comments: Raw abrasions to bilateral  nares. No active bleeding. No clots.    Mouth/Throat:     Pharynx: No oropharyngeal exudate.     Comments: No Blood in posterior pharynx. Eyes:     Conjunctiva/sclera: Conjunctivae normal.     Pupils: Pupils are equal, round, and reactive to light.  Neck:     Musculoskeletal: Normal range of  motion and neck supple.     Comments: No meningismus. Cardiovascular:     Rate and Rhythm: Normal rate and regular rhythm.     Heart sounds: Normal heart sounds. No murmur.  Pulmonary:     Effort: Pulmonary effort is normal. No respiratory distress.     Breath sounds: Normal breath sounds.  Chest:     Chest wall: No tenderness.  Abdominal:     Palpations: Abdomen is soft.     Tenderness: There is no abdominal tenderness. There is no guarding or rebound.  Musculoskeletal: Normal range of motion.        General: No tenderness.  Skin:    General: Skin is warm.     Capillary Refill: Capillary refill takes less than 2 seconds.  Neurological:     General: No focal deficit present.     Mental Status: She is alert and oriented to person, place, and time. Mental status is at baseline.     Cranial Nerves: No cranial nerve deficit.     Motor: No abnormal muscle tone.     Coordination: Coordination normal.     Comments: No ataxia on finger to nose bilaterally. No pronator drift. 5/5 strength throughout. CN 2-12 intact.Equal grip strength. Sensation intact.   Psychiatric:        Behavior: Behavior normal.      ED Treatments / Results  Labs (all labs ordered are listed, but only abnormal results are displayed) Labs Reviewed  CBC WITH DIFFERENTIAL/PLATELET - Abnormal; Notable for the following components:      Result Value   WBC 15.0 (*)    RBC 3.77 (*)    HCT 35.8 (*)    Neutro Abs 13.0 (*)    All other components within normal limits    EKG None  Radiology No results found.  Procedures .Epistaxis Management  Date/Time: 08/06/2019 11:53 PM Performed by: Ezequiel Essex, MD  Authorized by: Ezequiel Essex, MD   Consent:    Consent obtained:  Verbal   Consent given by:  Patient   Risks discussed:  Bleeding Anesthesia (see MAR for exact dosages):    Anesthesia method:  None Procedure details:    Treatment site:  L anterior and R anterior   Repair method: afrin.   Treatment complexity:  Limited Post-procedure details:    Assessment:  No improvement   Patient tolerance of procedure:  Tolerated well, no immediate complications   (including critical care time)  Medications Ordered in ED Medications  oxymetazoline (AFRIN) 0.05 % nasal spray 1 spray (1 spray Each Nare Given 08/06/19 2200)     Initial Impression / Assessment and Plan / ED Course  I have reviewed the triage vital signs and the nursing notes.  Pertinent labs & imaging results that were available during my care of the patient were reviewed by me and considered in my medical decision making (see chart for details).       Atraumatic nosebleed 1 hour prior to arrival.  Denies trauma.  Denies cocaine use.  Hypertensive at home but normotensive here.  She is tachycardic.  No active bleeding.  She is given oxymetazoline.  Patient's heart rate has improved to the 90s.   Labs showed normal hemoglobin and platelet count.  No further bleeding noticed in the ED.  Instructed on holding pressure if bleeding recurs.  Continue her home medications and follow-up with her PCP as well as ENT.  Return precautions discussed.    BP 115/70   Pulse 86  Temp 99.8 F (37.7 C) (Oral)   Resp 19   Ht 5\' 3"  (1.6 m)   Wt 59 kg   SpO2 99%   BMI 23.03 kg/m   Final Clinical Impressions(s) / ED Diagnoses   Final diagnoses:  Epistaxis    ED Discharge Orders    None       [place]idence Stivers, Annie Main, MD 08/06/19 2355

## 2019-08-06 NOTE — ED Triage Notes (Signed)
Pt arrives via [facility] w/nose bleed x 1hr. Pt spitting out clots upon arrival, bleeding controlled at this time. EMS states pt was hypertensive upon their arrival, BP 115/85 during triage. Pts only complaint is headache.

## 2019-08-12 ENCOUNTER — Other Ambulatory Visit (HOSPITAL_COMMUNITY)
Admission: RE | Admit: 2019-08-12 | Discharge: 2019-08-12 | Disposition: A | Discharge status: Home / Self Care | Payer: Medicare Other | Source: Ambulatory Visit | Attending: Internal Medicine | Admitting: Internal Medicine

## 2019-08-12 ENCOUNTER — Other Ambulatory Visit: Payer: Self-pay

## 2019-08-12 DIAGNOSIS — Z20822 Contact with and (suspected) exposure to covid-19: Secondary | ICD-10-CM

## 2019-08-12 DIAGNOSIS — I10 Essential (primary) hypertension: Secondary | ICD-10-CM | POA: Diagnosis present

## 2019-08-12 DIAGNOSIS — R569 Unspecified convulsions: Secondary | ICD-10-CM | POA: Insufficient documentation

## 2019-08-12 DIAGNOSIS — Z0001 Encounter for general adult medical examination with abnormal findings: Secondary | ICD-10-CM | POA: Insufficient documentation

## 2019-08-12 LAB — CBC WITH DIFFERENTIAL/PLATELET
Abs Immature Granulocytes: 0.03 10*3/uL (ref 0.00–0.07)
Basophils Absolute: 0.1 10*3/uL (ref 0.0–0.1)
Basophils Relative: 1 %
Eosinophils Absolute: 0.1 10*3/uL (ref 0.0–0.5)
Eosinophils Relative: 2 %
HCT: 34.6 % — ABNORMAL LOW (ref 36.0–46.0)
Hemoglobin: 11.3 g/dL — ABNORMAL LOW (ref 12.0–15.0)
Immature Granulocytes: 0 %
Lymphocytes Relative: 41 %
Lymphs Abs: 3.3 10*3/uL (ref 0.7–4.0)
MCH: 31.5 pg (ref 26.0–34.0)
MCHC: 32.7 g/dL (ref 30.0–36.0)
MCV: 96.4 fL (ref 80.0–100.0)
Monocytes Absolute: 0.5 10*3/uL (ref 0.1–1.0)
Monocytes Relative: 6 %
Neutro Abs: 4 10*3/uL (ref 1.7–7.7)
Neutrophils Relative %: 50 %
Platelets: 324 10*3/uL (ref 150–400)
RBC: 3.59 MIL/uL — ABNORMAL LOW (ref 3.87–5.11)
RDW: 13.7 % (ref 11.5–15.5)
WBC: 8 10*3/uL (ref 4.0–10.5)
nRBC: 0 % (ref 0.0–0.2)

## 2019-08-12 LAB — BASIC METABOLIC PANEL
Anion gap: 8 (ref 5–15)
BUN: 22 mg/dL — ABNORMAL HIGH (ref 6–20)
CO2: 24 mmol/L (ref 22–32)
Calcium: 9.1 mg/dL (ref 8.9–10.3)
Chloride: 104 mmol/L (ref 98–111)
Creatinine, Ser: 0.9 mg/dL (ref 0.44–1.00)
GFR calc Af Amer: >60 mL/min (ref >60)
GFR calc non Af Amer: >60 mL/min (ref >60)
Glucose, Bld: 92 mg/dL (ref 70–99)
Potassium: 3.7 mmol/L (ref 3.5–5.1)
Sodium: 136 mmol/L (ref 135–145)

## 2019-08-12 LAB — LIPID PANEL
Cholesterol: 180 mg/dL (ref 0–200)
HDL: 82 mg/dL (ref >40)
LDL Cholesterol: 74 mg/dL (ref 0–99)
Total CHOL/HDL Ratio: 2.2 RATIO
Triglycerides: 120 mg/dL (ref <150)
VLDL: 24 mg/dL (ref 0–40)

## 2019-08-12 LAB — HEPATIC FUNCTION PANEL
ALT: 19 U/L (ref 0–44)
AST: 19 U/L (ref 15–41)
Albumin: 4.1 g/dL (ref 3.5–5.0)
Alkaline Phosphatase: 60 U/L (ref 38–126)
Bilirubin, Direct: <0.1 mg/dL (ref 0.0–0.2)
Total Bilirubin: 0.5 mg/dL (ref 0.3–1.2)
Total Protein: 7.3 g/dL (ref 6.5–8.1)

## 2019-08-13 LAB — NOVEL CORONAVIRUS, NAA: SARS-CoV-2, NAA: NOT DETECTED

## 2019-08-26 DIAGNOSIS — R040 Epistaxis: Secondary | ICD-10-CM | POA: Diagnosis present

## 2019-11-12 ENCOUNTER — Ambulatory Visit (INDEPENDENT_AMBULATORY_CARE_PROVIDER_SITE_OTHER): Payer: Medicare Other | Admitting: Orthopedic Surgery

## 2019-11-12 ENCOUNTER — Encounter: Payer: Self-pay | Admitting: Orthopedic Surgery

## 2019-11-12 ENCOUNTER — Other Ambulatory Visit: Payer: Self-pay

## 2019-11-12 VITALS — BP 116/83 | HR 59 | Temp 97.3°F | Ht 62.0 in | Wt 143.4 lb

## 2019-11-12 DIAGNOSIS — G5601 Carpal tunnel syndrome, right upper limb: Secondary | ICD-10-CM | POA: Diagnosis not present

## 2019-11-12 DIAGNOSIS — M654 Radial styloid tenosynovitis [de Quervain]: Secondary | ICD-10-CM

## 2019-11-12 NOTE — Addendum Note (Signed)
Addended by: Brand Males E on: 11/12/2019 09:11 AM   Modules accepted: Orders

## 2019-11-12 NOTE — Patient Instructions (Signed)
Needs NCS/EMG

## 2019-11-12 NOTE — Progress Notes (Signed)
Chief Complaint  Patient presents with  . Hand Pain    Right hand pain from thumb and through wrist has been hurting for awhile     54 year old female was seen on Feb 06, 2017 after a fall diagnosed with de Quervain's syndrome treated with ibuprofen prednisone bracing and ice comes in with continued pain over the right thumb radiating from the first extensor compartment distally and she also has some volar wrist pain and volar forearm pain and some occasional numbness and tingling in the thumb and portion of the hand.  She does not want to get an injection  Review of Systems  Skin: Negative.   Neurological: Negative for weakness.   Past Medical History:  Diagnosis Date  . Arthritis   . Asthma   . Bronchitis   . Chronic shoulder pain   . Hypertension   . Insomnia   . Seizures (HCC)    BP 116/83 (BP Location: Right Arm, Patient Position: Sitting)   Pulse (!) 59   Temp (!) 97.3 F (36.3 C)   Ht 5\' 2"  (1.575 m)   Wt 143 lb 6.4 oz (65 kg)   BMI 26.23 kg/m  Physical Exam Vitals and nursing note reviewed.  Constitutional:      Appearance: Normal appearance.  Musculoskeletal:     Comments: Right hand, tenderness over the first extensor compartment.  Positive Finkelstein's test.  She also has tenderness on the volar aspect of the wrist and over the carpal tunnel with positive compression test.  Forearm seems tender at the distal aspect including the FCR tendon  Color capillary refill are normal  Sensation appears to be intact at this time good distal radial pulse  No restriction in range of motion wrist is stable  Neurological:     Mental Status: She is alert and oriented to person, place, and time.  Psychiatric:        Mood and Affect: Mood normal.    Encounter Diagnoses  Name Primary?  Tennis Must Quervain's disease (tenosynovitis) Yes  . Carpal tunnel syndrome of right wrist     The patient declined injection  However we would like to send her for nerve conduction study  for carpal tunnel syndrome.  Depending on the results of that study we will proceed with a first extensor compartment release plus or minus a carpal tunnel release  Patient is understanding of the plan and will wait for [place] to call her with those results

## 2019-12-26 ENCOUNTER — Encounter: Payer: Self-pay | Admitting: Physical Medicine and Rehabilitation

## 2019-12-26 ENCOUNTER — Ambulatory Visit (INDEPENDENT_AMBULATORY_CARE_PROVIDER_SITE_OTHER): Payer: Medicare Other | Admitting: Physical Medicine and Rehabilitation

## 2019-12-26 ENCOUNTER — Other Ambulatory Visit: Payer: Self-pay

## 2019-12-26 DIAGNOSIS — R202 Paresthesia of skin: Secondary | ICD-10-CM | POA: Diagnosis not present

## 2019-12-26 NOTE — Progress Notes (Signed)
.  Numeric Pain Rating Scale and Functional Assessment Average Pain 10   In the last MONTH (on 0-10 scale) has pain interfered with the following?  1. General activity like being  able to carry out your everyday physical activities such as walking, climbing stairs, carrying groceries, or moving a chair?  Rating(10)     

## 2019-12-29 NOTE — Procedures (Signed)
EMG & NCV Findings: Evaluation of the right median motor nerve showed reduced amplitude (4.3 mV).  All remaining nerves (as indicated in the following tables) were within normal limits.    Impression: Essentially NORMAL electrodiagnostic study of the right upper limb.  There is no significant electrodiagnostic evidence of nerve entrapment or generalized peripheral neuropathy.    As you know, purely sensory or demyelinating radiculopathies and chemical radiculitis may not be detected with this particular electrodiagnostic study.  Recommendations: 1.  Follow-up with referring physician. 2.  Continue current management of symptoms.  ___________________________ Laurence Spates FAAPMR Board Certified, American Board of Physical Medicine and Rehabilitation    Nerve Conduction Studies Anti Sensory Summary Table   Stim Site NR Peak (ms) Norm Peak (ms) P-T Amp (V) Norm P-T Amp Site1 Site2 Delta-P (ms) Dist (cm) Vel (m/s) Norm Vel (m/s)  Right Median Acr Palm Anti Sensory (2nd Digit)  31.7C  Wrist    3.2 <3.6 16.7 >10 Wrist Palm 1.6 0.0    Palm    1.6 <2.0 4.2         Right Radial Anti Sensory (Base 1st Digit)  31.9C  Wrist    2.0 <3.1 30.0  Wrist Base 1st Digit 2.0 0.0    Right Ulnar Anti Sensory (5th Digit)  32C  Wrist    3.0 <3.7 17.3 >15.0 Wrist 5th Digit 3.0 14.0 47 >38   Motor Summary Table   Stim Site NR Onset (ms) Norm Onset (ms) O-P Amp (mV) Norm O-P Amp Site1 Site2 Delta-0 (ms) Dist (cm) Vel (m/s) Norm Vel (m/s)  Right Median Motor (Abd Poll Brev)  32C  Wrist    2.8 <4.2 *4.3 >5 Elbow Wrist 3.6 19.5 54 >50  Elbow    6.4  3.3         Right Ulnar Motor (Abd Dig Min)  31.8C  Wrist    2.7 <4.2 8.6 >3 B Elbow Wrist 2.9 19.5 67 >53  B Elbow    5.6  8.1  A Elbow B Elbow 1.8 10.0 56 >53  A Elbow    7.4  7.5           Nerve Conduction Studies Anti Sensory Left/Right Comparison   Stim Site L Lat (ms) R Lat (ms) L-R Lat (ms) L Amp (V) R Amp (V) L-R Amp (%) Site1 Site2 L Vel  (m/s) R Vel (m/s) L-R Vel (m/s)  Median Acr Palm Anti Sensory (2nd Digit)  31.7C  Wrist  3.2   16.7  Wrist Palm     Palm  1.6   4.2        Radial Anti Sensory (Base 1st Digit)  31.9C  Wrist  2.0   30.0  Wrist Base 1st Digit     Ulnar Anti Sensory (5th Digit)  32C  Wrist  3.0   17.3  Wrist 5th Digit  47    Motor Left/Right Comparison   Stim Site L Lat (ms) R Lat (ms) L-R Lat (ms) L Amp (mV) R Amp (mV) L-R Amp (%) Site1 Site2 L Vel (m/s) R Vel (m/s) L-R Vel (m/s)  Median Motor (Abd Poll Brev)  32C  Wrist  2.8   *4.3  Elbow Wrist  54   Elbow  6.4   3.3        Ulnar Motor (Abd Dig Min)  31.8C  Wrist  2.7   8.6  B Elbow Wrist  67   B Elbow  5.6   8.1  A Elbow  B Elbow  56   A Elbow  7.4   7.5           Waveforms:

## 2019-12-29 NOTE — Progress Notes (Signed)
Kirsten Walls - 54 y.o. female MRN [id number]  Date of birth: 09-20-1965  Office Visit Note: Visit Date: 12/26/2019 PCP: Rosita Fire, MD Referred by: Rosita Fire, MD  Subjective: Chief Complaint  Patient presents with  . Right Hand - Pain, Numbness, Tingling  . Right Forearm - Pain   HPI: Kirsten Walls is a 54 y.o. female who comes in today At the request of Dr. Arther Abbott for electrodiagnostic study of the right upper limb.  Patient is right-hand dominant with history of de Quervain's tenosynovitis with treatment with bracing prednisone etc.  She has occasional tingling in the right hand and thumb but does not really endorse frank numbness in the hand.  She has no radicular complaints.  No prior electrodiagnostic studies.  No symptoms in the left hand and no history of peripheral neuropathy.  ROS Otherwise per HPI.  Assessment & Plan: Visit Diagnoses:  1. Paresthesia of skin     Plan: Impression: Essentially NORMAL electrodiagnostic study of the right upper limb.  There is no significant electrodiagnostic evidence of nerve entrapment or generalized peripheral neuropathy.    As you know, purely sensory or demyelinating radiculopathies and chemical radiculitis may not be detected with this particular electrodiagnostic study.  Recommendations: 1.  Follow-up with referring physician. 2.  Continue current management of symptoms.   Meds & Orders: No orders of the defined types were placed in this encounter.   Orders Placed This Encounter  Procedures  . NCV with EMG (electromyography)    Follow-up: Return for Arther Abbott, MD.   Procedures: No procedures performed  EMG & NCV Findings: Evaluation of the right median motor nerve showed reduced amplitude (4.3 mV).  All remaining nerves (as indicated in the following tables) were within normal limits.    Impression: Essentially NORMAL electrodiagnostic study of the right upper limb.  There is no significant  electrodiagnostic evidence of nerve entrapment or generalized peripheral neuropathy.    As you know, purely sensory or demyelinating radiculopathies and chemical radiculitis may not be detected with this particular electrodiagnostic study.  Recommendations: 1.  Follow-up with referring physician. 2.  Continue current management of symptoms.  ___________________________ Laurence Spates FAAPMR Board Certified, American Board of Physical Medicine and Rehabilitation    Nerve Conduction Studies Anti Sensory Summary Table   Stim Site NR Peak (ms) Norm Peak (ms) P-T Amp (V) Norm P-T Amp Site1 Site2 Delta-P (ms) Dist (cm) Vel (m/s) Norm Vel (m/s)  Right Median Acr Palm Anti Sensory (2nd Digit)  31.7C  Wrist    3.2 <3.6 16.7 >10 Wrist Palm 1.6 0.0    Palm    1.6 <2.0 4.2         Right Radial Anti Sensory (Base 1st Digit)  31.9C  Wrist    2.0 <3.1 30.0  Wrist Base 1st Digit 2.0 0.0    Right Ulnar Anti Sensory (5th Digit)  32C  Wrist    3.0 <3.7 17.3 >15.0 Wrist 5th Digit 3.0 14.0 47 >38   Motor Summary Table   Stim Site NR Onset (ms) Norm Onset (ms) O-P Amp (mV) Norm O-P Amp Site1 Site2 Delta-0 (ms) Dist (cm) Vel (m/s) Norm Vel (m/s)  Right Median Motor (Abd Poll Brev)  32C  Wrist    2.8 <4.2 *4.3 >5 Elbow Wrist 3.6 19.5 54 >50  Elbow    6.4  3.3         Right Ulnar Motor (Abd Dig Min)  31.8C  Wrist    2.7 <4.2 8.6 >  3 B Elbow Wrist 2.9 19.5 67 >53  B Elbow    5.6  8.1  A Elbow B Elbow 1.8 10.0 56 >53  A Elbow    7.4  7.5           Nerve Conduction Studies Anti Sensory Left/Right Comparison   Stim Site L Lat (ms) R Lat (ms) L-R Lat (ms) L Amp (V) R Amp (V) L-R Amp (%) Site1 Site2 L Vel (m/s) R Vel (m/s) L-R Vel (m/s)  Median Acr Palm Anti Sensory (2nd Digit)  31.7C  Wrist  3.2   16.7  Wrist Palm     Palm  1.6   4.2        Radial Anti Sensory (Base 1st Digit)  31.9C  Wrist  2.0   30.0  Wrist Base 1st Digit     Ulnar Anti Sensory (5th Digit)  32C  Wrist  3.0   17.3  Wrist  5th Digit  47    Motor Left/Right Comparison   Stim Site L Lat (ms) R Lat (ms) L-R Lat (ms) L Amp (mV) R Amp (mV) L-R Amp (%) Site1 Site2 L Vel (m/s) R Vel (m/s) L-R Vel (m/s)  Median Motor (Abd Poll Brev)  32C  Wrist  2.8   *4.3  Elbow Wrist  54   Elbow  6.4   3.3        Ulnar Motor (Abd Dig Min)  31.8C  Wrist  2.7   8.6  B Elbow Wrist  67   B Elbow  5.6   8.1  A Elbow B Elbow  56   A Elbow  7.4   7.5           Waveforms:             Clinical History: No specialty comments available.   She reports that she has been smoking cigarettes. She has a 15.00 pack-year smoking history. She has never used smokeless tobacco. No results for input(s): HGBA1C, LABURIC in the last 8760 hours.  Objective:  VS:  HT:    WT:   BMI:     BP:   HR: bpm  TEMP: ( )  RESP:  Physical Exam Musculoskeletal:        General: No swelling, tenderness or deformity.     Comments: Inspection reveals no atrophy of the bilateral APB or FDI or hand intrinsics. There is no swelling, color changes, allodynia or dystrophic changes. There is 5 out of 5 strength in the bilateral wrist extension, finger abduction and long finger flexion. There is intact sensation to light touch in all dermatomal and peripheral nerve distributions.  There is a positive Finkelstein's on the right. There is a negative Hoffmann's test bilaterally.  Skin:    General: Skin is warm and dry.     Findings: No erythema or rash.  Neurological:     General: No focal deficit present.     Mental Status: She is alert and oriented to person, place, and time.     Motor: No weakness or abnormal muscle tone.     Coordination: Coordination normal.  Psychiatric:        Mood and Affect: Mood normal.        Behavior: Behavior normal.     Ortho Exam Imaging: No results found.  Past Medical/Family/Surgical/Social History: Medications & Allergies reviewed per EMR, new medications updated. Patient Active Problem List   Diagnosis Date Noted    . Insomnia related to another mental  disorder 07/30/2017  . Chronic insomnia 07/30/2017  . History of colonic polyps   . Diverticulosis of colon without hemorrhage   . Encounter for screening colonoscopy 07/27/2015  . Polypharmacy 07/27/2015  . Arthritis, shoulder region 04/27/2014  . Acute bursitis of right shoulder 02/16/2014  . Shoulder injury 02/16/2014  . 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 BUNION 03/17/2009  . H N P-LUMBAR 06/03/2008  . KNEE, ARTHRITIS, DEGEN./OSTEO 02/11/2008  . NEOPLASMS UNSPEC NATURE BONE SOFT TISSUE&SKIN 02/03/2008  . LOW BACK PAIN 02/03/2008  . DERANGEMENT MENISCUS 01/23/2008  . JOINT EFFUSION, LEFT KNEE 01/23/2008  . TEAR MEDIAL MENISCUS 01/23/2008   Past Medical History:  Diagnosis Date  . Arthritis   . Asthma   . Bronchitis   . Chronic shoulder pain   . Hypertension   . Insomnia   . Seizures ([place])    Family History  Problem Relation Age of Onset  . Hypertension Mother   . Hyperlipidemia Mother   . Diabetes Mother   . Asthma Other   . Colon cancer Neg Hx    Past Surgical History:  Procedure Laterality Date  . ABDOMINAL HYSTERECTOMY    . BUNIONECTOMY  both big toe  . CESAREAN SECTION  x2  . COLONOSCOPY N/A 08/18/2015   Procedure: COLONOSCOPY;  Surgeon: Daneil Dolin, MD;  Location: [facility];  Service: Endoscopy;  Laterality: N/A;  1000  . FOOT SURGERY    . KNEE SURGERY    . SHOULDER ARTHROSCOPY    . TUMOR REMOVAL  left knee cap   Social History   Occupational History  . Not on file  Tobacco Use  . Smoking status: Heavy Tobacco Smoker    Packs/day: 0.50    Years: 30.00    Pack years: 15.00    Types: Cigarettes  . Smokeless tobacco: Never Used  . Tobacco comment: social  Substance and Sexual Activity  . Alcohol use: Yes    Comment: social  . Drug use: No  . Sexual activity: Not Currently    Birth control/protection: Surgical    Comment: hyst

## 2020-03-22 ENCOUNTER — Other Ambulatory Visit: Payer: Self-pay

## 2020-03-22 ENCOUNTER — Emergency Department (HOSPITAL_COMMUNITY)
Admission: EM | Admit: 2020-03-22 | Discharge: 2020-03-22 | Disposition: A | Discharge status: Home / Self Care | Payer: Medicare Other | Source: Home / Self Care | Attending: Emergency Medicine | Admitting: Emergency Medicine

## 2020-03-22 ENCOUNTER — Encounter (HOSPITAL_COMMUNITY): Payer: Self-pay

## 2020-03-22 ENCOUNTER — Encounter (HOSPITAL_COMMUNITY): Payer: Self-pay | Admitting: Emergency Medicine

## 2020-03-22 ENCOUNTER — Observation Stay (HOSPITAL_COMMUNITY)
Admission: EM | Admit: 2020-03-22 | Discharge: 2020-03-24 | Disposition: A | Discharge status: Home / Self Care | Payer: Medicare Other | Attending: Internal Medicine | Admitting: Internal Medicine

## 2020-03-22 DIAGNOSIS — Z20822 Contact with and (suspected) exposure to covid-19: Secondary | ICD-10-CM | POA: Insufficient documentation

## 2020-03-22 DIAGNOSIS — K219 Gastro-esophageal reflux disease without esophagitis: Secondary | ICD-10-CM | POA: Diagnosis present

## 2020-03-22 DIAGNOSIS — D72829 Elevated white blood cell count, unspecified: Secondary | ICD-10-CM | POA: Diagnosis present

## 2020-03-22 DIAGNOSIS — R040 Epistaxis: Secondary | ICD-10-CM

## 2020-03-22 DIAGNOSIS — I10 Essential (primary) hypertension: Secondary | ICD-10-CM | POA: Insufficient documentation

## 2020-03-22 DIAGNOSIS — D62 Acute posthemorrhagic anemia: Secondary | ICD-10-CM | POA: Diagnosis not present

## 2020-03-22 DIAGNOSIS — F17210 Nicotine dependence, cigarettes, uncomplicated: Secondary | ICD-10-CM | POA: Insufficient documentation

## 2020-03-22 DIAGNOSIS — Z720 Tobacco use: Secondary | ICD-10-CM | POA: Diagnosis present

## 2020-03-22 DIAGNOSIS — J45909 Unspecified asthma, uncomplicated: Secondary | ICD-10-CM | POA: Diagnosis not present

## 2020-03-22 LAB — CBC WITH DIFFERENTIAL/PLATELET
Abs Immature Granulocytes: 0.01 10*3/uL (ref 0.00–0.07)
Basophils Absolute: 0 10*3/uL (ref 0.0–0.1)
Basophils Relative: 1 %
Eosinophils Absolute: 0.1 10*3/uL (ref 0.0–0.5)
Eosinophils Relative: 1 %
HCT: 35.9 % — ABNORMAL LOW (ref 36.0–46.0)
Hemoglobin: 11.9 g/dL — ABNORMAL LOW (ref 12.0–15.0)
Immature Granulocytes: 0 %
Lymphocytes Relative: 30 %
Lymphs Abs: 1.6 10*3/uL (ref 0.7–4.0)
MCH: 31.5 pg (ref 26.0–34.0)
MCHC: 33.1 g/dL (ref 30.0–36.0)
MCV: 95 fL (ref 80.0–100.0)
Monocytes Absolute: 0.3 10*3/uL (ref 0.1–1.0)
Monocytes Relative: 6 %
Neutro Abs: 3.4 10*3/uL (ref 1.7–7.7)
Neutrophils Relative %: 62 %
Platelets: 318 10*3/uL (ref 150–400)
RBC: 3.78 MIL/uL — ABNORMAL LOW (ref 3.87–5.11)
RDW: 14 % (ref 11.5–15.5)
WBC: 5.4 10*3/uL (ref 4.0–10.5)
nRBC: 0 % (ref 0.0–0.2)

## 2020-03-22 MED ORDER — TRANEXAMIC ACID FOR EPISTAXIS
500.0000 mg | Freq: Once | TOPICAL | Status: AC
  Administered 2020-03-22: 11:00:00 500 mg via TOPICAL
  Filled 2020-03-22: qty 10

## 2020-03-22 NOTE — Discharge Instructions (Addendum)
Avoid bending over, straining, or heavy lifting today.  Leave the nasal packing in place for 2 days.  Return here for removal.  Also call Dr. Benjamine Mola office to arrange a follow-up appointment.

## 2020-03-22 NOTE — ED Notes (Signed)
Pts nose still bleeding/clots

## 2020-03-22 NOTE — ED Notes (Signed)
Pt given an emesis bag to spit blood into and wash rags to hold pressure to the nose. Informed pt to hold the bridge of her nose she states she cannot do that due to packing in other nare.

## 2020-03-22 NOTE — ED Triage Notes (Addendum)
Patient c/o epistaxis times x2 hours. EMS sprayed Afrin with no improvement. Pt presents with large clots in both nostrils. Pt reports bending over to wash hair in sink when nose bleed began.   Second nose bleed today. States was seen here for same and was told needed cauterization if occurred again.   Denies blood thinners.

## 2020-03-22 NOTE — ED Triage Notes (Signed)
Pt seen here this morning for same. Pt presents with rhinorocket to LEFT, but now bleeding to right nare.

## 2020-03-22 NOTE — ED Provider Notes (Signed)
[facility] Provider Note   CSN: [id number] Arrival date & time: 03/22/20  1019     History Chief Complaint  Patient presents with  . Epistaxis    Kirsten Walls is a 54 y.o. female.  HPI      Kirsten Walls is a 54 y.o. female who presents to the Emergency Department by EMS for nosebleed.  She states that she woke this morning at 4 AM with bleeding from both nostrils.  He states that she used Afrin and the bleeding resolved.  She woke around 8 AM this morning to wash her hair and the nosebleed began again after leaning forward into the bathroom sink.  She reports excessive bleeding from her left nostril and blood dripping into the back of her throat.  EMS was contacted and Afrin was reapplied without improvement.  She states that she was seen here in November for a similar episode.  She has a history of hypertension, states that she takes her medications daily and took her morning dose at 4 AM.  She denies use of blood thinners and cocaine.  No recent injury.  She also denies syncope, headache, dizziness, visual changes or nausea vomiting.   Past Medical History:  Diagnosis Date  . Arthritis   . Asthma   . Bronchitis   . Chronic shoulder pain   . Hypertension   . Insomnia   . Seizures [facility])     Patient Active Problem List   Diagnosis Date Noted  . Insomnia related to another mental disorder 07/30/2017  . Chronic insomnia 07/30/2017  . History of colonic polyps   . Diverticulosis of colon without hemorrhage   . Encounter for screening colonoscopy 07/27/2015  . Polypharmacy 07/27/2015  . Arthritis, shoulder region 04/27/2014  . Acute bursitis of right shoulder 02/16/2014  . Shoulder injury 02/16/2014  . 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 BUNION 03/17/2009  . H N P-LUMBAR 06/03/2008  . KNEE, ARTHRITIS, DEGEN./OSTEO 02/11/2008  . NEOPLASMS UNSPEC NATURE BONE SOFT TISSUE&SKIN 02/03/2008  . LOW BACK PAIN 02/03/2008  . DERANGEMENT MENISCUS  01/23/2008  . JOINT EFFUSION, LEFT KNEE 01/23/2008  . TEAR MEDIAL MENISCUS 01/23/2008    Past Surgical History:  Procedure Laterality Date  . ABDOMINAL HYSTERECTOMY    . BUNIONECTOMY  both big toe  . CESAREAN SECTION  x2  . COLONOSCOPY N/A 08/18/2015   Procedure: COLONOSCOPY;  Surgeon: Daneil Dolin, MD;  Location: [facility];  Service: Endoscopy;  Laterality: N/A;  1000  . FOOT SURGERY    . KNEE SURGERY    . SHOULDER ARTHROSCOPY    . TUMOR REMOVAL  left knee cap     OB History    Gravida  4   Para  4   Term  4   Preterm      AB      Living  4     SAB      TAB      Ectopic      Multiple      Live Births              Family History  Problem Relation Age of Onset  . Hypertension Mother   . Hyperlipidemia Mother   . Diabetes Mother   . Asthma Other   . Colon cancer Neg Hx     Social History   Tobacco Use  . Smoking status: Heavy Tobacco Smoker    Packs/day: 0.50    Years: 30.00    Pack  years: 15.00    Types: Cigarettes  . Smokeless tobacco: Never Used  . Tobacco comment: social  Substance Use Topics  . Alcohol use: Yes    Comment: social  . Drug use: No    Home Medications Prior to Admission medications   Medication Sig Start Date End Date Taking? Authorizing Provider  albuterol (VENTOLIN HFA) 108 (90 BASE) MCG/ACT inhaler Inhale 2 puffs into the lungs every 6 (six) hours as needed. For shortness of breath/asthma/bronchitis     [provider]  amLODipine (NORVASC) 5 MG tablet Take 5 mg by mouth daily.    [provider]  baclofen (LIORESAL) 10 MG tablet Take 10 mg by mouth 2 (two) times daily.     [provider]  EPINEPHrine (EPIPEN 2-PAK) 0.3 mg/0.3 mL IJ SOAJ injection Inject 0.3 mLs (0.3 mg total) into the muscle as needed for anaphylaxis. 01/15/19   Fredia Sorrow, MD  famotidine (PEPCID) 20 MG tablet Take 1 tablet (20 mg total) by mouth 2 (two) times daily. 01/15/19   Fredia Sorrow, MD  ibuprofen  (ADVIL,MOTRIN) 800 MG tablet Take 800 mg by mouth every 8 (eight) hours as needed for mild pain or moderate pain.     [provider]  pantoprazole (PROTONIX) 40 MG tablet Take 40 mg by mouth daily.  02/11/15   [provider]  predniSONE (DELTASONE) 10 MG tablet Take 4 tablets (40 mg total) by mouth daily. 01/15/19   Fredia Sorrow, MD  topiramate (TOPAMAX) 25 MG tablet Take 100 mg by mouth daily.    [provider]  Vitamin D, Ergocalciferol, (DRISDOL) 1.25 MG (50000 UT) CAPS capsule Take 50,000 Units by mouth every 7 (seven) days.    [provider]    Allergies    Penicillins, Azithromycin, and Sulfamethoxazole-trimethoprim  Review of Systems   Review of Systems  Constitutional: Negative for appetite change, chills, fatigue and fever.  HENT: Positive for nosebleeds. Negative for sore throat and trouble swallowing.   Eyes: Negative for visual disturbance.  Respiratory: Negative for shortness of breath.   Cardiovascular: Negative for chest pain.  Gastrointestinal: Negative for nausea and vomiting.  Musculoskeletal: Negative for neck pain and neck stiffness.  Skin: Negative for rash.  Neurological: Negative for dizziness, syncope, weakness, light-headedness, numbness and headaches.  Hematological: Does not bruise/bleed easily.    Physical Exam Updated Vital Signs Ht 5\' 3"  (1.6 m)   Wt 67.6 kg   BMI 26.39 kg/m   Physical Exam Vitals and nursing note reviewed.  Constitutional:      Appearance: Normal appearance.  HENT:     Head: Normocephalic.     Nose: No septal deviation or signs of injury.     Left Nostril: Epistaxis present. No foreign body.     Comments: Epistaxis noted of the left nare.  No anterior sites of bleeding seen on exam.  No septal hematoma.  Right nare w/o bleeding, large nasal polyp present.    Mouth/Throat:     Pharynx: Uvula midline. No pharyngeal swelling or uvula swelling.     Comments: Bright red blood noted to the  posterior pharynx.  No clots seen Eyes:     Pupils: Pupils are equal, round, and reactive to light.  Neck:     Thyroid: No thyromegaly.     Meningeal: Kernig's sign absent.  Cardiovascular:     Rate and Rhythm: Normal rate and regular rhythm.     Heart sounds: Normal heart sounds.  Pulmonary:     Effort:  Pulmonary effort is normal.     Breath sounds: Normal breath sounds. No wheezing.  Abdominal:     Palpations: Abdomen is soft.     Tenderness: There is no abdominal tenderness. There is no guarding or rebound.  Musculoskeletal:        General: Normal range of motion.     Cervical back: Normal range of motion and neck supple.  Skin:    General: Skin is warm and dry.     Findings: No rash.  Neurological:     Mental Status: She is alert and oriented to person, place, and time.     ED Results / Procedures / Treatments   Labs (all labs ordered are listed, but only abnormal results are displayed) Labs Reviewed  CBC WITH DIFFERENTIAL/PLATELET - Abnormal; Notable for the following components:      Result Value   RBC 3.78 (*)    Hemoglobin 11.9 (*)    HCT 35.9 (*)    All other components within normal limits    EKG None  Radiology No results found.  Procedures .Epistaxis Management  Date/Time: 03/22/2020 12:00 PM Performed by: Kem Parkinson, PA-C Authorized by: Kem Parkinson, PA-C   Consent:    Consent obtained:  Verbal   Consent given by:  Patient   Risks discussed:  Bleeding and nasal injury   Alternatives discussed:  Alternative treatment Anesthesia (see MAR for exact dosages):    Anesthesia method:  None Procedure details:    Treatment site:  L posterior   Treatment method:  Nasal balloon   Treatment complexity:  Extensive   Treatment episode: initial   Post-procedure details:    Assessment:  Bleeding stopped   Patient tolerance of procedure:  Tolerated well, no immediate complications Comments:     TXA initially applied to cotton pledget in left nare,  minimal improvement.  Proceeded with nasal packing   (including critical care time)  Medications Ordered in ED Medications  tranexamic acid (CYKLOKAPRON) 1000 MG/10ML topical solution 500 mg (500 mg Topical Given by Other 03/22/20 1121)    ED Course  I have reviewed the triage vital signs and the nursing notes.  Pertinent labs & imaging results that were available during my care of the patient were reviewed by me and considered in my medical decision making (see chart for details).    MDM Rules/Calculators/A&P                          Patient with left-sided epistaxis.  Here in November for similar episode that resolved after using Afrin.  No improvement with Afrin this morning.  Noted to be mildly hypertensive.  Took her blood pressure medication this morning prior to arrival.  Hemoglobin reassuring. No headache, no focal neuro deficits.  Epistaxis controlled with nasal packing.  Patient observed in the department without further complication or recurrence of bleeding.  She agrees to treatment plan with return here in 2 days for removal and will follow up with ENT.   Final Clinical Impression(s) / ED Diagnoses Final diagnoses:  Left-sided epistaxis    Rx / DC Orders ED Discharge Orders    None       Bufford Lope 03/22/20 2202    Isla Pence, MD 03/23/20 [phone]

## 2020-03-22 NOTE — ED Notes (Signed)
Patient expressing anger over time spent in ED. Rude to staff.

## 2020-03-23 DIAGNOSIS — D72829 Elevated white blood cell count, unspecified: Secondary | ICD-10-CM | POA: Diagnosis present

## 2020-03-23 DIAGNOSIS — D62 Acute posthemorrhagic anemia: Secondary | ICD-10-CM | POA: Diagnosis not present

## 2020-03-23 DIAGNOSIS — Z720 Tobacco use: Secondary | ICD-10-CM

## 2020-03-23 DIAGNOSIS — R040 Epistaxis: Secondary | ICD-10-CM | POA: Diagnosis not present

## 2020-03-23 DIAGNOSIS — K219 Gastro-esophageal reflux disease without esophagitis: Secondary | ICD-10-CM

## 2020-03-23 LAB — CBC WITH DIFFERENTIAL/PLATELET
Abs Immature Granulocytes: 0.06 10*3/uL (ref 0.00–0.07)
Basophils Absolute: 0.1 10*3/uL (ref 0.0–0.1)
Basophils Relative: 0 %
Eosinophils Absolute: 0 10*3/uL (ref 0.0–0.5)
Eosinophils Relative: 0 %
HCT: 28.5 % — ABNORMAL LOW (ref 36.0–46.0)
Hemoglobin: 9.7 g/dL — ABNORMAL LOW (ref 12.0–15.0)
Immature Granulocytes: 0 %
Lymphocytes Relative: 18 %
Lymphs Abs: 2.5 10*3/uL (ref 0.7–4.0)
MCH: 32.2 pg (ref 26.0–34.0)
MCHC: 34 g/dL (ref 30.0–36.0)
MCV: 94.7 fL (ref 80.0–100.0)
Monocytes Absolute: 0.7 10*3/uL (ref 0.1–1.0)
Monocytes Relative: 5 %
Neutro Abs: 10.5 10*3/uL — ABNORMAL HIGH (ref 1.7–7.7)
Neutrophils Relative %: 77 %
Platelets: 166 10*3/uL (ref 150–400)
RBC: 3.01 MIL/uL — ABNORMAL LOW (ref 3.87–5.11)
RDW: 13.9 % (ref 11.5–15.5)
WBC: 13.8 10*3/uL — ABNORMAL HIGH (ref 4.0–10.5)
nRBC: 0 % (ref 0.0–0.2)

## 2020-03-23 LAB — CBC
HCT: 26.1 % — ABNORMAL LOW (ref 36.0–46.0)
Hemoglobin: 8.8 g/dL — ABNORMAL LOW (ref 12.0–15.0)
MCH: 31.5 pg (ref 26.0–34.0)
MCHC: 33.7 g/dL (ref 30.0–36.0)
MCV: 93.5 fL (ref 80.0–100.0)
Platelets: 195 10*3/uL (ref 150–400)
RBC: 2.79 MIL/uL — ABNORMAL LOW (ref 3.87–5.11)
RDW: 14.6 % (ref 11.5–15.5)
WBC: 13.3 10*3/uL — ABNORMAL HIGH (ref 4.0–10.5)
nRBC: 0 % (ref 0.0–0.2)

## 2020-03-23 LAB — COMPREHENSIVE METABOLIC PANEL
ALT: 21 U/L (ref 0–44)
AST: 28 U/L (ref 15–41)
Albumin: 3.7 g/dL (ref 3.5–5.0)
Alkaline Phosphatase: 60 U/L (ref 38–126)
Anion gap: 9 (ref 5–15)
BUN: 23 mg/dL — ABNORMAL HIGH (ref 6–20)
CO2: 22 mmol/L (ref 22–32)
Calcium: 8.8 mg/dL — ABNORMAL LOW (ref 8.9–10.3)
Chloride: 108 mmol/L (ref 98–111)
Creatinine, Ser: 0.95 mg/dL (ref 0.44–1.00)
GFR calc Af Amer: >60 mL/min (ref >60)
GFR calc non Af Amer: >60 mL/min (ref >60)
Glucose, Bld: 166 mg/dL — ABNORMAL HIGH (ref 70–99)
Potassium: 3.4 mmol/L — ABNORMAL LOW (ref 3.5–5.1)
Sodium: 139 mmol/L (ref 135–145)
Total Bilirubin: 0.6 mg/dL (ref 0.3–1.2)
Total Protein: 6.5 g/dL (ref 6.5–8.1)

## 2020-03-23 LAB — PROTIME-INR
INR: 1 (ref 0.8–1.2)
Prothrombin Time: 12.5 seconds (ref 11.4–15.2)

## 2020-03-23 LAB — HEMOGLOBIN AND HEMATOCRIT, BLOOD
HCT: 19.9 % — ABNORMAL LOW (ref 36.0–46.0)
Hemoglobin: 6.4 g/dL — CL (ref 12.0–15.0)

## 2020-03-23 LAB — HIV ANTIBODY (ROUTINE TESTING W REFLEX): HIV Screen 4th Generation wRfx: NONREACTIVE

## 2020-03-23 LAB — PREPARE RBC (CROSSMATCH)

## 2020-03-23 LAB — SARS CORONAVIRUS 2 BY RT PCR (HOSPITAL ORDER, PERFORMED IN ~~LOC~~ HOSPITAL LAB): SARS Coronavirus 2: NEGATIVE

## 2020-03-23 MED ORDER — FENTANYL CITRATE (PF) 100 MCG/2ML IJ SOLN
50.0000 ug | Freq: Once | INTRAMUSCULAR | Status: AC
  Administered 2020-03-23: 02:00:00 50 ug via INTRAMUSCULAR
  Filled 2020-03-23: qty 2

## 2020-03-23 MED ORDER — HYDROMORPHONE HCL 1 MG/ML IJ SOLN
0.5000 mg | Freq: Once | INTRAMUSCULAR | Status: AC
  Administered 2020-03-23: 0.5 mg via INTRAVENOUS
  Filled 2020-03-23: qty 1

## 2020-03-23 MED ORDER — ACETAMINOPHEN 650 MG RE SUPP: 650.0000 mg | Freq: Four times a day (QID) | RECTAL | Status: DC | PRN

## 2020-03-23 MED ORDER — SODIUM CHLORIDE 0.9 % IV SOLN: 10.0000 mL/h | Freq: Once | INTRAVENOUS | Status: DC

## 2020-03-23 MED ORDER — ONDANSETRON HCL 4 MG/2ML IJ SOLN
INTRAMUSCULAR | Status: AC
  Filled 2020-03-23: qty 2

## 2020-03-23 MED ORDER — ONDANSETRON HCL 4 MG/2ML IJ SOLN: 4.0000 mg | Freq: Four times a day (QID) | INTRAMUSCULAR | Status: DC | PRN

## 2020-03-23 MED ORDER — ONDANSETRON HCL 4 MG PO TABS: 4.0000 mg | ORAL_TABLET | Freq: Four times a day (QID) | ORAL | Status: DC | PRN

## 2020-03-23 MED ORDER — LACTATED RINGERS IV BOLUS
1000.0000 mL | Freq: Once | INTRAVENOUS | Status: AC
  Administered 2020-03-23: 04:00:00 1000 mL via INTRAVENOUS

## 2020-03-23 MED ORDER — ACETAMINOPHEN 325 MG PO TABS: 650.0000 mg | ORAL_TABLET | Freq: Four times a day (QID) | ORAL | Status: DC | PRN

## 2020-03-23 MED ORDER — HYDROCODONE-ACETAMINOPHEN 5-325 MG PO TABS
1.0000 | ORAL_TABLET | Freq: Four times a day (QID) | ORAL | Status: DC | PRN
  Administered 2020-03-23 – 2020-03-24 (×3): 1 via ORAL
  Filled 2020-03-23 (×4): qty 1

## 2020-03-23 MED ORDER — BACLOFEN 10 MG PO TABS
10.0000 mg | ORAL_TABLET | Freq: Two times a day (BID) | ORAL | Status: DC | PRN
  Filled 2020-03-23: qty 1

## 2020-03-23 MED ORDER — DOCUSATE SODIUM 100 MG PO CAPS: 100.0000 mg | ORAL_CAPSULE | Freq: Every day | ORAL | Status: DC | PRN

## 2020-03-23 MED ORDER — TOPIRAMATE 25 MG PO TABS
100.0000 mg | ORAL_TABLET | Freq: Every day | ORAL | Status: DC
  Administered 2020-03-23 – 2020-03-24 (×2): 100 mg via ORAL
  Filled 2020-03-23 (×2): qty 4

## 2020-03-23 MED ORDER — PANTOPRAZOLE SODIUM 40 MG PO TBEC
40.0000 mg | DELAYED_RELEASE_TABLET | Freq: Every day | ORAL | Status: DC
  Administered 2020-03-23 – 2020-03-24 (×2): 40 mg via ORAL
  Filled 2020-03-23 (×2): qty 1

## 2020-03-23 MED ORDER — SODIUM CHLORIDE 0.9 % IV SOLN: 1.0000 g | INTRAVENOUS | Status: DC

## 2020-03-23 MED ORDER — ALBUTEROL SULFATE (2.5 MG/3ML) 0.083% IN NEBU: 2.5000 mg | INHALATION_SOLUTION | Freq: Four times a day (QID) | RESPIRATORY_TRACT | Status: DC | PRN

## 2020-03-23 MED ORDER — ONDANSETRON 4 MG PO TBDP
4.0000 mg | ORAL_TABLET | Freq: Once | ORAL | Status: AC
  Administered 2020-03-23: 02:00:00 4 mg via ORAL
  Filled 2020-03-23: qty 1

## 2020-03-23 MED ORDER — CEPHALEXIN 250 MG PO CAPS
500.0000 mg | ORAL_CAPSULE | Freq: Two times a day (BID) | ORAL | Status: DC
  Administered 2020-03-23 – 2020-03-24 (×2): 500 mg via ORAL
  Filled 2020-03-23 (×3): qty 2

## 2020-03-23 MED ORDER — SODIUM CHLORIDE 0.9% FLUSH
3.0000 mL | Freq: Two times a day (BID) | INTRAVENOUS | Status: DC
  Administered 2020-03-23: 3 mL via INTRAVENOUS

## 2020-03-23 NOTE — ED Notes (Signed)
During exam with ENT, pt's pressure went below 70. Pt given 1L of fluids and zofran per provider. Second line started. Pt became alert from pain stimulation. Pt is now aox4.

## 2020-03-23 NOTE — ED Notes (Signed)
edp ok'd pt to drink. Sprite given.

## 2020-03-23 NOTE — ED Provider Notes (Signed)
[facility] EMERGENCY DEPARTMENT Provider Note   CSN: [id number] Arrival date & time: 03/22/20  2257     History Chief Complaint  Patient presents with  . Epistaxis    Kirsten Walls is a 54 y.o. female.  Here earlier with a rhino rocket placed, states pain, irritation, continued bleeding around it and out of her right nare.   The history is provided by the patient.  Epistaxis Location:  L nare Severity:  Mild Duration:  1 day Timing:  Intermittent Progression:  Waxing and waning Chronicity:  New Context: not anticoagulants, not aspirin use, not CPAP, not drug use, not home oxygen and not nose picking   Relieved by:  None tried Worsened by:  Nothing Ineffective treatments:  None tried Associated symptoms: blood in oropharynx and facial pain   Associated symptoms: no congestion, no cough and no fever   Risk factors: no alcohol use, no change in medication, no intranasal steroids, no liver disease, no recent chemotherapy and no recent nasal surgery        Past Medical History:  Diagnosis Date  . Arthritis   . Asthma   . Bronchitis   . Chronic shoulder pain   . Hypertension   . Insomnia   . Seizures [facility])     Patient Active Problem List   Diagnosis Date Noted  . Insomnia related to another mental disorder 07/30/2017  . Chronic insomnia 07/30/2017  . History of colonic polyps   . Diverticulosis of colon without hemorrhage   . Encounter for screening colonoscopy 07/27/2015  . Polypharmacy 07/27/2015  . Arthritis, shoulder region 04/27/2014  . Acute bursitis of right shoulder 02/16/2014  . Shoulder injury 02/16/2014  . 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 BUNION 03/17/2009  . H N P-LUMBAR 06/03/2008  . KNEE, ARTHRITIS, DEGEN./OSTEO 02/11/2008  . NEOPLASMS UNSPEC NATURE BONE SOFT TISSUE&SKIN 02/03/2008  . LOW BACK PAIN 02/03/2008  . DERANGEMENT MENISCUS 01/23/2008  . JOINT EFFUSION, LEFT KNEE 01/23/2008  . TEAR MEDIAL MENISCUS 01/23/2008    Past  Surgical History:  Procedure Laterality Date  . ABDOMINAL HYSTERECTOMY    . BUNIONECTOMY  both big toe  . CESAREAN SECTION  x2  . COLONOSCOPY N/A 08/18/2015   Procedure: COLONOSCOPY;  Surgeon: Daneil Dolin, MD;  Location: [facility];  Service: Endoscopy;  Laterality: N/A;  1000  . FOOT SURGERY    . KNEE SURGERY    . SHOULDER ARTHROSCOPY    . TUMOR REMOVAL  left knee cap     OB History    Gravida  4   Para  4   Term  4   Preterm      AB      Living  4     SAB      TAB      Ectopic      Multiple      Live Births              Family History  Problem Relation Age of Onset  . Hypertension Mother   . Hyperlipidemia Mother   . Diabetes Mother   . Asthma Other   . Colon cancer Neg Hx     Social History   Tobacco Use  . Smoking status: Heavy Tobacco Smoker    Packs/day: 0.50    Years: 30.00    Pack years: 15.00    Types: Cigarettes  . Smokeless tobacco: Never Used  . Tobacco comment: social  Substance Use Topics  . Alcohol use: Yes  Comment: social  . Drug use: No    Home Medications Prior to Admission medications   Medication Sig Start Date End Date Taking? Authorizing Provider  albuterol (VENTOLIN HFA) 108 (90 BASE) MCG/ACT inhaler Inhale 2 puffs into the lungs every 6 (six) hours as needed. For shortness of breath/asthma/bronchitis     [provider]  amLODipine (NORVASC) 5 MG tablet Take 5 mg by mouth daily.    [provider]  baclofen (LIORESAL) 10 MG tablet Take 10 mg by mouth 2 (two) times daily.     [provider]  EPINEPHrine (EPIPEN 2-PAK) 0.3 mg/0.3 mL IJ SOAJ injection Inject 0.3 mLs (0.3 mg total) into the muscle as needed for anaphylaxis. 01/15/19   Fredia Sorrow, MD  famotidine (PEPCID) 20 MG tablet Take 1 tablet (20 mg total) by mouth 2 (two) times daily. 01/15/19   Fredia Sorrow, MD  ibuprofen (ADVIL,MOTRIN) 800 MG tablet Take 800 mg by mouth every 8 (eight) hours as needed for mild pain or  moderate pain.     [provider]  pantoprazole (PROTONIX) 40 MG tablet Take 40 mg by mouth daily.  02/11/15   [provider]  predniSONE (DELTASONE) 10 MG tablet Take 4 tablets (40 mg total) by mouth daily. 01/15/19   Fredia Sorrow, MD  topiramate (TOPAMAX) 25 MG tablet Take 100 mg by mouth daily.    [provider]  Vitamin D, Ergocalciferol, (DRISDOL) 1.25 MG (50000 UT) CAPS capsule Take 50,000 Units by mouth every 7 (seven) days.    [provider]    Allergies    Penicillins, Azithromycin, and Sulfamethoxazole-trimethoprim  Review of Systems   Review of Systems  Constitutional: Negative for fever.  HENT: Positive for nosebleeds. Negative for congestion.   Respiratory: Negative for cough.   All other systems reviewed and are negative.   Physical Exam Updated Vital Signs BP 130/86   Pulse 77   Temp 98 F (36.7 C)   Resp 16   Ht 5\' 3"  (1.6 m)   Wt 67.6 kg   SpO2 98%   BMI 26.40 kg/m   Physical Exam Vitals and nursing note reviewed.  Constitutional:      Appearance: She is well-developed.  HENT:     Head: Normocephalic and atraumatic.     Nose:     Comments: Mild oozing around rapid rhino in left nare    Mouth/Throat:     Mouth: Mucous membranes are dry.     Pharynx: Oropharynx is clear.  Eyes:     Conjunctiva/sclera: Conjunctivae normal.  Cardiovascular:     Rate and Rhythm: Normal rate and regular rhythm.  Pulmonary:     Effort: No respiratory distress.     Breath sounds: No stridor.  Abdominal:     General: Abdomen is flat. There is no distension.  Musculoskeletal:        General: No swelling or tenderness. Normal range of motion.     Cervical back: Normal range of motion.  Skin:    General: Skin is warm and dry.  Neurological:     General: No focal deficit present.     Mental Status: She is alert.     Cranial Nerves: No cranial nerve deficit.     ED Results / Procedures / Treatments   Labs (all labs ordered  are listed, but only abnormal results are displayed) Labs Reviewed  CBC WITH DIFFERENTIAL/PLATELET - Abnormal; Notable for the following components:      Result Value  WBC 13.8 (*)    RBC 3.01 (*)    Hemoglobin 9.7 (*)    HCT 28.5 (*)    Neutro Abs 10.5 (*)    All other components within normal limits  COMPREHENSIVE METABOLIC PANEL - Abnormal; Notable for the following components:   Potassium 3.4 (*)    Glucose, Bld 166 (*)    BUN 23 (*)    Calcium 8.8 (*)    All other components within normal limits  PROTIME-INR    EKG None  Radiology No results found.  Procedures Procedures (including critical care time)  Medications Ordered in ED Medications  fentaNYL (SUBLIMAZE) injection 50 mcg (50 mcg Intramuscular Given 03/23/20 0201)  ondansetron (ZOFRAN-ODT) disintegrating tablet 4 mg (4 mg Oral Given 03/23/20 0201)  lactated ringers bolus 1,000 mL (1,000 mLs Intravenous New Bag/Given (Non-Interop) 03/23/20 0422)  HYDROmorphone (DILAUDID) injection 0.5 mg (0.5 mg Intravenous Given 03/23/20 0410)    ED Course  I have reviewed the triage vital signs and the nursing notes.  Pertinent labs & imaging results that were available during my care of the patient were reviewed by me and considered in my medical decision making (see chart for details).    MDM Rules/Calculators/A&P                          Patient adamant about trying something besides rapid rhino. Upon removal, profuse bleeding, did not tolerate merocel packing placement so replaced rapid rhino with another 7.5 cm one. Slowly inflated to attempt to get hemostasis however was unable to do so.  Ultimately got about 15 to 20 cc of air in there and still oozing around it.  Patient not able tolerate any more secondary discomfort.  She has a significant drop in her hemoglobin since earlier in the day.  She is hemodynamically stable but with continued to bleed and drop her hemoglobin and the rapid amount of brisk bleeding she had when the  packings were out I discussed with Dr. Constance Holster with [facility] per ENT and he requests transfer ED to ED and request page so that he can evaluate.  I discussed with Dr. Nicholes Stairs at [facility] emergency department who accepts.  Final Clinical Impression(s) / ED Diagnoses Final diagnoses:  Left-sided epistaxis    Rx / DC Orders ED Discharge Orders    None       Michio Thier, Corene Cornea, MD 03/23/20 [phone]

## 2020-03-23 NOTE — ED Notes (Signed)
Surgical ENT cart at bedside

## 2020-03-23 NOTE — ED Provider Notes (Signed)
  Physical Exam  BP 131/76   Pulse 66   Temp 98.8 F (37.1 C) (Oral)   Resp 16   Ht 5\' 3"  (1.6 m)   Wt 67.6 kg   SpO2 100%   BMI 26.40 kg/m   Physical Exam  ED Course/Procedures     Procedures  MDM  Patient presented as transfer from [facility].  Recurrent nosebleed.  Hemoglobin is dropped from 11 down to 6.  Dr. Constance Holster at bedside to control bleeding.  However with symptomatic anemia with the acute blood loss will require admission the hospital.  Will discuss with hospitalist.   CRITICAL CARE Performed by: Davonna Belling Total critical care time: 30 minutes Critical care time was exclusive of separately billable procedures and treating other patients. Critical care was necessary to treat or prevent imminent or life-threatening deterioration. Critical care was time spent personally by me on the following activities: development of treatment plan with patient and/or surrogate as well as nursing, discussions with consultants, evaluation of patient's response to treatment, examination of patient, obtaining history from patient or surrogate, ordering and performing treatments and interventions, ordering and review of laboratory studies, ordering and review of radiographic studies, pulse oximetry and re-evaluation of patient's condition.       Davonna Belling, MD 03/23/20 [phone]

## 2020-03-23 NOTE — ED Notes (Signed)
During blood administration Pt BP dropped into the 80's. Additional litter of fluids hung. Provider has been notified.

## 2020-03-23 NOTE — ED Provider Notes (Addendum)
Patient transferred from [facility] for ENT evaluation by Dr. Constance Holster.  In brief ongoing epistaxis from left naris.  Continue to have some bleeding with Rhino Rocket in place.  Dropped hemoglobin 2 points.  On my evaluation she is hemodynamically stable.  Reports some discomfort from the packing and also some ongoing drainage.  No obvious brisk bleeding noted.  Discussed with Dr. Constance Holster on the phone.  He requests OR ENT cart at the bedside.  Primary nurse to arrange for this.   Merryl Hacker, MD 03/23/20 6770   7:21 AM Dr. Constance Holster at the bedside.  ENT cart missing several instruments.  Patient became hypotensive.  Fluids started.  Type and screen ordered.  Repeat hemoglobin and hematocrit.  Will reevaluate.  Patient placed in Trendelenburg.   Merryl Hacker, MD 03/23/20 [phone]

## 2020-03-23 NOTE — H&P (Addendum)
History and Physical    Kirsten Walls OIZ:[id number] DOB: [birth year] DOA: 03/22/2020  Referring MD/NP/PA: Davonna Belling, MD PCP: Rosita Fire, MD  Patient coming from: Transfer from [facility]  Chief Complaint: Nosebleed  I have personally briefly reviewed patient's old medical records in [place]   HPI: Kirsten Walls is a 54 y.o. female with medical history significant of hypertension, bronchitis, and tobacco use who presents with complaints of a nosebleed from the left nostril for the last 2 days.  She initially tried Afrin to provide relief of symptoms until she tried washing her hair.  She was unable to get the bleeding to stop and had gone to [facility].  On the first time they had placed a [employer], but she had to return because she continued to have pain bleeding around the packing in her right nare.  She ultimately was transferred from [facility] to [facility].  She is not on any anticoagulants and denies significant NSAID use.    ED Course: Upon admission into the emergency department patient was seen to be afebrile with pulse 66-102, blood pressures as low as 69/49 with improvement 1 L lactated Ringer's.  Labs significant for hemoglobin 11.9 on 7/5-> 9.7-> 6.4, potassium 3.4, BUN 23, creatinine 0.95, and glucose 166.  Patient was typed and screened and orders were placed to transfuse 2 units of packed red blood cells.  Dr. Constance Holster of ENT was consulted and was able to stop bleeding with nasal packing.  Patient had also been given pain medication which may have also contributed to drop in blood pressure.  [facility] called to admit.  Review of Systems  Constitutional: Positive for malaise/fatigue. Negative for fever.  HENT: Positive for nosebleeds.   Eyes: Negative for photophobia and pain.  Respiratory: Negative for cough and shortness of breath.   Cardiovascular: Negative for chest pain and leg swelling.  Gastrointestinal: Negative for diarrhea, nausea  and vomiting.  Genitourinary: Negative for hematuria.  Musculoskeletal: Positive for joint pain. Negative for back pain.  Skin: Negative for rash.  Neurological: Positive for dizziness and loss of consciousness.  Psychiatric/Behavioral: Negative for memory loss. The patient has insomnia.     Past Medical History:  Diagnosis Date  . Arthritis   . Asthma   . Bronchitis   . Chronic shoulder pain   . Hypertension   . Insomnia   . Seizures ([place])     Past Surgical History:  Procedure Laterality Date  . ABDOMINAL HYSTERECTOMY    . BUNIONECTOMY  both big toe  . CESAREAN SECTION  x2  . COLONOSCOPY N/A 08/18/2015   Procedure: COLONOSCOPY;  Surgeon: Daneil Dolin, MD;  Location: [facility] SU[facility];  Service: Endoscopy;  Laterality: N/A;  1000  . FOOT SURGERY    . KNEE SURGERY    . SHOULDER ARTHROSCOPY    . TUMOR REMOVAL  left knee cap     reports that she has been smoking cigarettes. She has a 15.00 pack-year smoking history. She has never used smokeless tobacco. She reports current alcohol use. She reports that she does not use drugs.  Allergies  Allergen Reactions  . Penicillins Hives    Did it involve swelling of the face/tongue/throat, SOB, or low BP? Yes Did it involve sudden or severe rash/hives, skin peeling, or any reaction on the inside of your mouth or nose? No Did you need to seek medical attention at a hospital or doctor's office? Yes When did it last happen?Over 10 years  ago-childhood If all above answers are "NO", may proceed with cephalosporin use.   . Azithromycin Rash  . Sulfamethoxazole-Trimethoprim Hives and Rash    Family History  Problem Relation Age of Onset  . Hypertension Mother   . Hyperlipidemia Mother   . Diabetes Mother   . Asthma Other   . Colon cancer Neg Hx     Prior to Admission medications   Medication Sig Start Date End Date Taking? Authorizing Provider  albuterol (VENTOLIN HFA) 108 (90 BASE) MCG/ACT inhaler Inhale 2 puffs into the lungs  every 6 (six) hours as needed. For shortness of breath/asthma/bronchitis     [provider]  amLODipine (NORVASC) 5 MG tablet Take 5 mg by mouth daily.    [provider]  baclofen (LIORESAL) 10 MG tablet Take 10 mg by mouth 2 (two) times daily.     [provider]  EPINEPHrine (EPIPEN 2-PAK) 0.3 mg/0.3 mL IJ SOAJ injection Inject 0.3 mLs (0.3 mg total) into the muscle as needed for anaphylaxis. 01/15/19   Fredia Sorrow, MD  esomeprazole (NEXIUM) 40 MG capsule Take 40 mg by mouth daily. 03/17/20   [provider]  famotidine (PEPCID) 20 MG tablet Take 1 tablet (20 mg total) by mouth 2 (two) times daily. 01/15/19   Fredia Sorrow, MD  ibuprofen (ADVIL,MOTRIN) 800 MG tablet Take 800 mg by mouth every 8 (eight) hours as needed for mild pain or moderate pain.     [provider]  pantoprazole (PROTONIX) 40 MG tablet Take 40 mg by mouth daily.  02/11/15   [provider]  predniSONE (DELTASONE) 10 MG tablet Take 4 tablets (40 mg total) by mouth daily. 01/15/19   Fredia Sorrow, MD  topiramate (TOPAMAX) 25 MG tablet Take 100 mg by mouth daily.    [provider]  Vitamin D, Ergocalciferol, (DRISDOL) 1.25 MG (50000 UT) CAPS capsule Take 50,000 Units by mouth every 7 (seven) days.    [provider]    Physical Exam:  Constitutional: Middle-aged female currently in NAD, calm, comfortable Vitals:   03/23/20 0718 03/23/20 0735 03/23/20 0745 03/23/20 0755  BP: (!) 76/56 122/78 130/81 131/76  Pulse: 73 83 77 66  Resp: (!) 27 18 18 16   Temp:      TempSrc:      SpO2: 99% 96% 98% 100%  Weight:      Height:       Eyes: PERRL, lids and conjunctivae normal ENMT: Mucous membranes are moist.  Nasal packing present of the leftt nare Neck: normal, supple, no masses, no thyromegaly Respiratory: clear to auscultation bilaterally, no wheezing, no crackles. Normal respiratory effort. No accessory muscle use.  Cardiovascular: Regular rate  and rhythm, no murmurs / rubs / gallops. No extremity edema. 2+ pedal pulses. No carotid bruits.  Abdomen: no tenderness, no masses palpated. No hepatosplenomegaly. Bowel sounds positive.  Musculoskeletal: no clubbing / cyanosis. No joint deformity upper and lower extremities. Good ROM, no contractures. Normal muscle tone.  Skin: no rashes, lesions, ulcers. No induration Neurologic: CN 2-12 grossly intact. Sensation intact, DTR normal. Strength 5/5 in all 4.  Psychiatric: Normal judgment and insight. Alert and oriented x 3. Normal mood.     Labs on Admission: I have personally reviewed following labs and imaging studies  CBC: Recent Labs  Lab 03/22/20 1125 03/23/20 0356 03/23/20 0742  WBC 5.4 13.8*  --   NEUTROABS 3.4 10.5*  --   HGB 11.9* 9.7* 6.4*  HCT 35.9* 28.5* 19.9*  MCV 95.0  94.7  --   PLT 318 166  --    Basic Metabolic Panel: Recent Labs  Lab 03/23/20 0356  NA 139  K 3.4*  CL 108  CO2 22  GLUCOSE 166*  BUN 23*  CREATININE 0.95  CALCIUM 8.8*   GFR: Estimated Creatinine Clearance: 61.8 mL/min (by C-G formula based on SCr of 0.95 mg/dL). Liver Function Tests: Recent Labs  Lab 03/23/20 0356  AST 28  ALT 21  ALKPHOS 60  BILITOT 0.6  PROT 6.5  ALBUMIN 3.7   No results for input(s): LIPASE, AMYLASE in the last 168 hours. No results for input(s): AMMONIA in the last 168 hours. Coagulation Profile: Recent Labs  Lab 03/23/20 0356  INR 1.0   Cardiac Enzymes: No results for input(s): CKTOTAL, CKMB, CKMBINDEX, TROPONINI in the last 168 hours. BNP (last 3 results) No results for input(s): PROBNP in the last 8760 hours. HbA1C: No results for input(s): HGBA1C in the last 72 hours. CBG: No results for input(s): GLUCAP in the last 168 hours. Lipid Profile: No results for input(s): CHOL, HDL, LDLCALC, TRIG, CHOLHDL, LDLDIRECT in the last 72 hours. Thyroid Function Tests: No results for input(s): TSH, T4TOTAL, FREET4, T3FREE, THYROIDAB in the last 72  hours. Anemia Panel: No results for input(s): VITAMINB12, FOLATE, FERRITIN, TIBC, IRON, RETICCTPCT in the last 72 hours. Urine analysis:    Component Value Date/Time   COLORURINE YELLOW 03/24/2010 1650   APPEARANCEUR CLEAR 03/24/2010 1650   LABSPEC 1.020 03/24/2010 1650   PHURINE 6.0 03/24/2010 1650   GLUCOSEU NEGATIVE 03/24/2010 1650   HGBUR NEGATIVE 03/24/2010 1650   BILIRUBINUR NEGATIVE 03/24/2010 1650   KETONESUR TRACE (A) 03/24/2010 1650   PROTEINUR NEGATIVE 03/24/2010 1650   UROBILINOGEN 0.2 03/24/2010 1650   NITRITE NEGATIVE 03/24/2010 1650   LEUKOCYTESUR  03/24/2010 1650    NEGATIVE MICROSCOPIC NOT DONE ON URINES WITH NEGATIVE PROTEIN, BLOOD, LEUKOCYTES, NITRITE, OR GLUCOSE <1000 mg/dL.   Sepsis Labs: Recent Results (from the past 240 hour(s))  SARS Coronavirus 2 by RT PCR (hospital order, performed in [facility] hospital lab) Nasopharyngeal Nasopharyngeal Swab     Status: None   Collection Time: 03/23/20  6:39 AM   Specimen: Nasopharyngeal Swab  Result Value Ref Range Status   SARS Coronavirus 2 NEGATIVE NEGATIVE Final    Comment: (NOTE) SARS-CoV-2 target nucleic acids are NOT DETECTED.  The SARS-CoV-2 RNA is generally detectable in upper and lower respiratory specimens during the acute phase of infection. The lowest concentration of SARS-CoV-2 viral copies this assay can detect is 250 copies / mL. A negative result does not preclude SARS-CoV-2 infection and should not be used as the sole basis for treatment or other patient management decisions.  A negative result may occur with improper specimen collection / handling, submission of specimen other than nasopharyngeal swab, presence of viral mutation(s) within the areas targeted by this assay, and inadequate number of viral copies (<250 copies / mL). A negative result must be combined with clinical observations, patient history, and epidemiological information.  Fact Sheet for Patients:    StrictlyIdeas.no  Fact Sheet for Healthcare Providers: BankingDealers.co.za  This test is not yet approved or  cleared by the [place] FDA and has been authorized for detection and/or diagnosis of SARS-CoV-2 by FDA under an Emergency Use Authorization (EUA).  This EUA will remain in effect (meaning this test can be used) for the duration of the COVID-19 declaration under Section 564(b)(1) of the Act, 21 U.S.C. section 360bbb-3(b)(1), unless the authorization is terminated  or revoked sooner.  Performed at [facility], [place] [address], [place], [place] [address]      Radiological Exams on Admission: No results found.    Assessment/Plan Epistaxis Acute blood loss anemia: Patient presents with complaints of a leftt nosebleed for the last 2 days unable to appropriately with [employer].  Transferred from [facility] to [facility].  Found to have hemoglobin dropping acutely down to 6.4 g/dL when previously had been 11.9 g/dL on 7/5. -Admit to medical telemetry bed  -Continue with transfusion 1 of 2 units of packed red blood cells -Recheck hemoglobin prior to transfusing second unit -Will transfuse blood products if hemoglobin less than 7 -Appreciate ENT consultative services, recommend follow-up in the outpatient setting on Friday  Transient hypotension: Blood pressure noted to drop as low as 69/49.  Blood pressures improved with IV fluids.  Home blood pressure medications appear to amlodipine 5 mg daily, but patient has not taken this medication. -Hold amlodipine -IV fluids as needed  Leukocytosis: WBC elevated at 14.4.  Patient has been doing nasal packing for at least 2 days now.  Unclear of how long patient we will need to continue nasal packing.  Patient concern for possibility of bacterial infection related with continued nasal packing. -Keflex 500 mg twice daily as prophylaxis  GERD: Home medications include Nexium 40  mg daily. -Continue pharmacy substitution of Protonix  Tobacco use: Patient reports smoking cigarettes intermittently.  Declines need for nicotine patch. -Counseled on the need of cessation of tobacco use  DVT prophylaxis: scd   Code Status: full  Family Communication: Attempted to call patient's sister over the phone to give update Disposition Plan: Possible discharge home in a.m. Consults called: Dr. Constance Holster ENT Admission status: Inpatient  Norval Morton MD [facility] Pager [phone]   If 7PM-7AM, please contact night-coverage [email or web address] Password [email or web address]  03/23/2020, 8:51 AM

## 2020-03-23 NOTE — ED Notes (Signed)
[facility] arrived to take pt to  [facility] accepting .

## 2020-03-23 NOTE — Consult Note (Addendum)
Reason for Consult: Epistaxis Referring Physician: Davonna Belling, MD  Kirsten Walls is an 54 y.o. female.  HPI: Patient transferred from [facility] with intractable epistaxis despite having had to inflatable packing was placed on the left side.  It all started yesterday.  She had similar problems about 5 months ago also treated in the emergency department.  She is not on blood thinner.  She does have hypertension.  She has chronic lung disease and continues to smoke.  Past Medical History:  Diagnosis Date   Arthritis    Asthma    Bronchitis    Chronic shoulder pain    Hypertension    Insomnia    Seizures ([place])     Past Surgical History:  Procedure Laterality Date   ABDOMINAL HYSTERECTOMY     BUNIONECTOMY  both big toe   CESAREAN SECTION  x2   COLONOSCOPY N/A 08/18/2015   Procedure: COLONOSCOPY;  Surgeon: Daneil Dolin, MD;  Location: [facility];  Service: Endoscopy;  Laterality: N/A;  1000   FOOT SURGERY     KNEE SURGERY     SHOULDER ARTHROSCOPY     TUMOR REMOVAL  left knee cap    Family History  Problem Relation Age of Onset   Hypertension Mother    Hyperlipidemia Mother    Diabetes Mother    Asthma Other    Colon cancer Neg Hx     Social History:  reports that she has been smoking cigarettes. She has a 15.00 pack-year smoking history. She has never used smokeless tobacco. She reports current alcohol use. She reports that she does not use drugs.  Allergies:  Allergies  Allergen Reactions   Penicillins Hives    Did it involve swelling of the face/tongue/throat, SOB, or low BP? Yes Did it involve sudden or severe rash/hives, skin peeling, or any reaction on the inside of your mouth or nose? No Did you need to seek medical attention at a hospital or doctor's office? Yes When did it last happen?Over 10 years ago-childhood If all above answers are NO, may proceed with cephalosporin use.    Azithromycin Rash    Sulfamethoxazole-Trimethoprim Hives and Rash    Medications: Reviewed  Results for orders placed or performed during the hospital encounter of 03/22/20 (from the past 48 hour(s))  CBC with Differential     Status: Abnormal   Collection Time: 03/23/20  3:56 AM  Result Value Ref Range   WBC 13.8 (H) 4.0 - 10.5 K/uL   RBC 3.01 (L) 3.87 - 5.11 MIL/uL   Hemoglobin 9.7 (L) 12.0 - 15.0 g/dL   HCT 28.5 (L) 36 - 46 %   MCV 94.7 80.0 - 100.0 fL   MCH 32.2 26.0 - 34.0 pg   MCHC 34.0 30.0 - 36.0 g/dL   RDW 13.9 11.5 - 15.5 %   Platelets 166 150 - 400 K/uL   nRBC 0.0 0.0 - 0.2 %   Neutrophils Relative % 77 %   Neutro Abs 10.5 (H) 1.7 - 7.7 K/uL   Lymphocytes Relative 18 %   Lymphs Abs 2.5 0.7 - 4.0 K/uL   Monocytes Relative 5 %   Monocytes Absolute 0.7 0 - 1 K/uL   Eosinophils Relative 0 %   Eosinophils Absolute 0.0 0 - 0 K/uL   Basophils Relative 0 %   Basophils Absolute 0.1 0 - 0 K/uL   Immature Granulocytes 0 %   Abs Immature Granulocytes 0.06 0.00 - 0.07 K/uL    Comment: Performed at  [facility] [place], [place] [address]  Comprehensive metabolic panel     Status: Abnormal   Collection Time: 03/23/20  3:56 AM  Result Value Ref Range   Sodium 139 135 - 145 mmol/L   Potassium 3.4 (L) 3.5 - 5.1 mmol/L   Chloride 108 98 - 111 mmol/L   CO2 22 22 - 32 mmol/L   Glucose, Bld 166 (H) 70 - 99 mg/dL    Comment: Glucose reference range applies only to samples taken after fasting for at least 8 hours.   BUN 23 (H) 6 - 20 mg/dL   Creatinine, Ser 0.95 0.44 - 1.00 mg/dL   Calcium 8.8 (L) 8.9 - 10.3 mg/dL   Total Protein 6.5 6.5 - 8.1 g/dL   Albumin 3.7 3.5 - 5.0 g/dL   AST 28 15 - 41 U/L   ALT 21 0 - 44 U/L   Alkaline Phosphatase 60 38 - 126 U/L   Total Bilirubin 0.6 0.3 - 1.2 mg/dL   GFR calc non Af Amer >60 >60 mL/min   GFR calc Af Amer >60 >60 mL/min   Anion gap 9 5 - 15    Comment: Performed at [facility], [address] [place], [place] [address]  Protime-INR      Status: None   Collection Time: 03/23/20  3:56 AM  Result Value Ref Range   Prothrombin Time 12.5 11.4 - 15.2 seconds   INR 1.0 0.8 - 1.2    Comment: (NOTE) INR goal varies based on device and disease states. Performed at [facility], [address] [place], [place] [address]     No results found.  GNO:IBBCWUGQ except as listed in admit H&P  Blood pressure (!) 129/92, pulse 89, temperature 98.8 F (37.1 C), temperature source Oral, resp. rate 16, height 5\' 3"  (1.6 m), weight 67.6 kg, SpO2 100 %.  PHYSICAL EXAM: Overall appearance: Very anxious and moaning, no active bleeding.  Breathing is clear.  Head:  Normocephalic, atraumatic. Ears: External ears look healthy. Nose: External nose is healthy in appearance.  Inflatable packing in the left nasal cavity. Oral Cavity/Pharynx:  There are no mucosal lesions or masses identified.  No active bleeding but old blood staining the oropharynx. Larynx/Hypopharynx: Deferred Neuro:  No identifiable neurologic deficits. Neck: No palpable neck masses.  Studies Reviewed: none  Procedures: While I was talking to the patient and setting up my equipment she became hypotensive.  The ER staff came in to address this issue.   Assessment/Plan: Fluid resuscitated and stabilized and I will continue my work-up when able.  [name] 03/23/2020, 7:34 AM     Once she was medically stable I was able to complete the evaluation and treatment.  I removed the inflatable packing from the left nasal cavity.  There was brisk bleeding.  I used topical Afrin/Xylocaine on cotton pledgets.  I injected 1% Xylocaine with epinephrine into the septum and the inferior turbinate for a more thorough anesthesia.  I was then able to place a Slimline Merocel long without difficulty and inflated it with the local anesthetic solution.  There was no further bleeding after that.  Patient is found to have significant drop in hemoglobin is going to be admitted to the medicine  service for further treatment.  I will continue to follow.

## 2020-03-23 NOTE — ED Notes (Signed)
Pt yelling from room that she is tired of being here. Pt states "I've been here all day with nothing to drink." Informed pt that we would have to check with MD before giving her something to drink.

## 2020-03-23 NOTE — Plan of Care (Signed)

## 2020-03-24 DIAGNOSIS — R040 Epistaxis: Secondary | ICD-10-CM

## 2020-03-24 LAB — BASIC METABOLIC PANEL
Anion gap: 9 (ref 5–15)
BUN: 10 mg/dL (ref 6–20)
CO2: 21 mmol/L — ABNORMAL LOW (ref 22–32)
Calcium: 8.6 mg/dL — ABNORMAL LOW (ref 8.9–10.3)
Chloride: 112 mmol/L — ABNORMAL HIGH (ref 98–111)
Creatinine, Ser: 0.77 mg/dL (ref 0.44–1.00)
GFR calc Af Amer: >60 mL/min (ref >60)
GFR calc non Af Amer: >60 mL/min (ref >60)
Glucose, Bld: 117 mg/dL — ABNORMAL HIGH (ref 70–99)
Potassium: 3.3 mmol/L — ABNORMAL LOW (ref 3.5–5.1)
Sodium: 142 mmol/L (ref 135–145)

## 2020-03-24 LAB — CBC
HCT: 23.7 % — ABNORMAL LOW (ref 36.0–46.0)
Hemoglobin: 8 g/dL — ABNORMAL LOW (ref 12.0–15.0)
MCH: 31.1 pg (ref 26.0–34.0)
MCHC: 33.8 g/dL (ref 30.0–36.0)
MCV: 92.2 fL (ref 80.0–100.0)
Platelets: 187 10*3/uL (ref 150–400)
RBC: 2.57 MIL/uL — ABNORMAL LOW (ref 3.87–5.11)
RDW: 15 % (ref 11.5–15.5)
WBC: 8.7 10*3/uL (ref 4.0–10.5)
nRBC: 0 % (ref 0.0–0.2)

## 2020-03-24 MED ORDER — CEPHALEXIN 500 MG PO CAPS: 500.0000 mg | ORAL_CAPSULE | Freq: Two times a day (BID) | ORAL | 0 refills | Status: DC

## 2020-03-24 MED ORDER — POTASSIUM CHLORIDE CRYS ER 20 MEQ PO TBCR
20.0000 meq | EXTENDED_RELEASE_TABLET | Freq: Once | ORAL | Status: AC
  Administered 2020-03-24: 20 meq via ORAL
  Filled 2020-03-24: qty 1

## 2020-03-24 NOTE — Discharge Summary (Signed)
Kirsten Walls EZB:[id number] DOB: 1966-01-05 DOA: 03/22/2020  PCP: Kirsten Fire, MD  Admit date: 03/22/2020  Discharge date: 03/24/2020   Admitted From: Home   Disposition:  Home   Recommendations for Outpatient Follow-up:   Follow up with PCP in 1-2 weeks  PCP Please obtain BMP/CBC, 2 view CXR in 1week,  (see Discharge instructions)   PCP Please follow up on the following pending results: Needs ENT follow-up coming Monday for removal of left nasal packing.   Home Health: None  Equipment/Devices: None  Consultations: ENT Discharge Condition: Stable    CODE STATUS: Full    Diet Recommendation: Heart Healthy     Chief Complaint  Patient presents with  . Epistaxis     Brief history of present illness from the day of admission and additional interim summary      Kirsten Walls is a 54 y.o. female with medical history significant of hypertension, bronchitis, and tobacco use who presents with complaints of a nosebleed from the left nostril for the last 2 days, in the ER she had evidence of acute blood loss related anemia and required 2 units of packed RBC placement.  She was seen by Dr. Constance Walls underwent left nasal anterior packing with resolution of bleeding.  H&H is now stable patient is symptom-free will be discharged back to home.  Case discussed with Dr. Constance Walls who will see the patient back on Monday, social work to help the patient with transportation from [place].  Also requested her to follow with PCP in a week.  For her chronic medical problems of  GERD, bronchitis home medications will be continued.  No other medications have been changed.  She has been counseled to quit smoking.  Had mild hypokalemia which has been replaced prior to discharge.  Now completely symptom-free and eager to go home.  Note  patient is on Norvasc and blood pressure on soft, Norvasc has been discontinued.  PCP to monitor.     Discharge diagnosis     Principal Problem:   Epistaxis Active Problems:   Leukocytosis   Acute blood loss anemia   GERD (gastroesophageal reflux disease)   Tobacco use    Discharge instructions    Discharge Instructions    Diet - low sodium heart healthy   Complete by: As directed    Discharge instructions   Complete by: As directed    Follow with Primary MD Kirsten Fire, MD in 7 days   Get CBC, CMP, 2 view Chest X ray -  checked next visit within 1 week by Primary MD    Activity: As tolerated with Full fall precautions use walker/cane & assistance as needed  Disposition Home    Diet: Heart Healthy   Special Instructions: If you have smoked or chewed Tobacco  in the last 2 yrs please stop smoking, stop any regular Alcohol  and or any Recreational drug use.  On your next visit with your primary care physician please Get Medicines reviewed and adjusted.  Please request your Prim.MD to go over all  Hospital Tests and Procedure/Radiological results at the follow up, please get all Hospital records sent to your Prim MD by signing hospital release before you go home.  If you experience worsening of your admission symptoms, develop shortness of breath, life threatening emergency, suicidal or homicidal thoughts you must seek medical attention immediately by calling 911 or calling your MD immediately  if symptoms less severe.  You Must read complete instructions/literature along with all the possible adverse reactions/side effects for all the Medicines you take and that have been prescribed to you. Take any new Medicines after you have completely understood and accpet all the possible adverse reactions/side effects.   Increase activity slowly   Complete by: As directed       Discharge Medications   Allergies as of 03/24/2020      Reactions   Penicillins Hives   Did it  involve swelling of the face/tongue/throat, SOB, or low BP? Yes Did it involve sudden or severe rash/hives, skin peeling, or any reaction on the inside of your mouth or nose? No Did you need to seek medical attention at a hospital or doctor's office? Yes When did it last happen?Over 10 years ago-childhood If all above answers are "NO", may proceed with cephalosporin use.   Azithromycin Rash   Sulfamethoxazole-trimethoprim Hives, Rash      Medication List    STOP taking these medications   amLODipine 5 MG tablet Commonly known as: NORVASC     TAKE these medications   baclofen 10 MG tablet Commonly known as: LIORESAL Take 10 mg by mouth 2 (two) times daily as needed for muscle spasms.   cephALEXin 500 MG capsule Commonly known as: KEFLEX Take 1 capsule (500 mg total) by mouth every 12 (twelve) hours.   diphenhydramine-acetaminophen 25-500 MG Tabs tablet Commonly known as: TYLENOL PM Take 1 tablet by mouth at bedtime as needed (sleep, allergies).   docusate sodium 100 MG capsule Commonly known as: COLACE Take 100 mg by mouth daily as needed for mild constipation.   EPINEPHrine 0.3 mg/0.3 mL Soaj injection Commonly known as: EpiPen 2-Pak Inject 0.3 mLs (0.3 mg total) into the muscle as needed for anaphylaxis.   esomeprazole 40 MG capsule Commonly known as: NEXIUM Take 40 mg by mouth daily.   famotidine 20 MG tablet Commonly known as: PEPCID Take 1 tablet (20 mg total) by mouth 2 (two) times daily.   MULTIVITAMIN PO Take 1 tablet by mouth daily.   oxymetazoline 0.05 % nasal spray Commonly known as: AFRIN Place 1 spray into both nostrils 2 (two) times daily as needed for congestion.   topiramate 25 MG tablet Commonly known as: TOPAMAX Take 100 mg by mouth daily.   Ventolin HFA 108 (90 Base) MCG/ACT inhaler Generic drug: albuterol Inhale 2 puffs into the lungs every 6 (six) hours as needed. For shortness of breath/asthma/bronchitis   Vitamin D3 125 MCG (5000 UT)  Caps Take 5,000 Units by mouth daily.        Follow-up Information    Kirsten Fire, MD. Schedule an appointment as soon as possible for a visit in 1 week(s).   Specialty: Internal Medicine Contact information: [place] [address] [phone]        Kirsten Gala, MD. Schedule an appointment as soon as possible for a visit in 5 day(s).   Specialty: Otolaryngology Why: Removal of nasal packing Contact information: [address] [place] [address] [phone]  Major procedures and Radiology Reports - PLEASE review detailed and final reports thoroughly  -         No results found.  Micro Results     Recent Results (from the past 240 hour(s))  SARS Coronavirus 2 by RT PCR (hospital order, performed in [facility] hospital lab) Nasopharyngeal Nasopharyngeal Swab     Status: None   Collection Time: 03/23/20  6:39 AM   Specimen: Nasopharyngeal Swab  Result Value Ref Range Status   SARS Coronavirus 2 NEGATIVE NEGATIVE Final    Comment: (NOTE) SARS-CoV-2 target nucleic acids are NOT DETECTED.  The SARS-CoV-2 RNA is generally detectable in upper and lower respiratory specimens during the acute phase of infection. The lowest concentration of SARS-CoV-2 viral copies this assay can detect is 250 copies / mL. A negative result does not preclude SARS-CoV-2 infection and should not be used as the sole basis for treatment or other patient management decisions.  A negative result may occur with improper specimen collection / handling, submission of specimen other than nasopharyngeal swab, presence of viral mutation(s) within the areas targeted by this assay, and inadequate number of viral copies (<250 copies / mL). A negative result must be combined with clinical observations, patient history, and epidemiological information.  Fact Sheet for Patients:   StrictlyIdeas.no  Fact Sheet for  Healthcare Providers: BankingDealers.co.za  This test is not yet approved or  cleared by the [place] FDA and has been authorized for detection and/or diagnosis of SARS-CoV-2 by FDA under an Emergency Use Authorization (EUA).  This EUA will remain in effect (meaning this test can be used) for the duration of the COVID-19 declaration under Section 564(b)(1) of the Act, 21 U.S.C. section 360bbb-3(b)(1), unless the authorization is terminated or revoked sooner.  Performed at [facility], [place] [address] [place], [place] [address]     Today   Subjective    Kirsten Walls today has no headache,no chest abdominal pain,no new weakness tingling or numbness, feels much better wants to go home today.     Objective   Blood pressure 97/64, pulse 69, temperature 98.1 F (36.7 C), temperature source Oral, resp. rate 20, height 5\' 3"  (1.6 m), weight 67.6 kg, SpO2 100 %.   Intake/Output Summary (Last 24 hours) at 03/24/2020 1210 Last data filed at 03/24/2020 0830 Gross per 24 hour  Intake 936 ml  Output --  Net 936 ml    Exam  Awake Alert, No new F.N deficits, Normal affect [place].AT,PERRAL, L. nasal packing in place with no fresh blood Supple Neck,No JVD, No cervical lymphadenopathy appriciated.  Symmetrical Chest wall movement, Good air movement bilaterally, CTAB RRR,No Gallops,Rubs or new Murmurs, No Parasternal Heave +ve B.Sounds, Abd Soft, Non tender, No organomegaly appriciated, No rebound -guarding or rigidity. No Cyanosis, Clubbing or edema, No new Rash or bruise   Data Review   CBC w Diff:  Lab Results  Component Value Date   WBC 8.7 03/24/2020   HGB 8.0 (L) 03/24/2020   HCT 23.7 (L) 03/24/2020   PLT 187 03/24/2020   LYMPHOPCT 18 03/23/2020   MONOPCT 5 03/23/2020   EOSPCT 0 03/23/2020   BASOPCT 0 03/23/2020    CMP:  Lab Results  Component Value Date   NA 142 03/24/2020   K 3.3 (L) 03/24/2020   CL 112 (H) 03/24/2020   CO2 21 (L)  03/24/2020   BUN 10 03/24/2020   CREATININE 0.77 03/24/2020   PROT 6.5 03/23/2020   ALBUMIN 3.7 03/23/2020  BILITOT 0.6 03/23/2020   ALKPHOS 60 03/23/2020   AST 28 03/23/2020   ALT 21 03/23/2020  .   Total Time in preparing paper work, data evaluation and todays exam - 10 minutes  Lala Lund M.D on 03/24/2020 at 12:10 PM  [facility]   Office  [phone]

## 2020-03-24 NOTE — Progress Notes (Signed)
Small amt of bleeding noted from left nare after patient returned from [place].  She is anxious to go home today.  Will monitor for amt. of epistaxis and report accordingly.

## 2020-03-24 NOTE — Discharge Instructions (Signed)
Follow with Primary MD Rosita Fire, MD in 7 days   Get CBC, CMP, 2 view Chest X ray -  checked next visit within 1 week by Primary MD    Activity: As tolerated with Full fall precautions use walker/cane & assistance as needed  Disposition Home    Diet: Heart Healthy   Special Instructions: If you have smoked or chewed Tobacco  in the last 2 yrs please stop smoking, stop any regular Alcohol  and or any Recreational drug use.  On your next visit with your primary care physician please Get Medicines reviewed and adjusted.  Please request your Prim.MD to go over all Hospital Tests and Procedure/Radiological results at the follow up, please get all Hospital records sent to your Prim MD by signing hospital release before you go home.  If you experience worsening of your admission symptoms, develop shortness of breath, life threatening emergency, suicidal or homicidal thoughts you must seek medical attention immediately by calling 911 or calling your MD immediately  if symptoms less severe.  You Must read complete instructions/literature along with all the possible adverse reactions/side effects for all the Medicines you take and that have been prescribed to you. Take any new Medicines after you have completely understood and accpet all the possible adverse reactions/side effects.

## 2020-03-24 NOTE — Progress Notes (Signed)
Patient ID: Kirsten Walls, female   DOB: 1966-02-22, 54 y.o.   MRN: [id number] No further bleeding. Will need pack removed Monday in the office or in the hospital if still in.   She will need social worker consultation because she has no transportation to return to our office next week please.

## 2020-03-24 NOTE — Progress Notes (Signed)
Pt discharged to home via private vehicle driven by friend.  Escorted via wheelchair to exit accompanied by nurse tech.

## 2020-03-24 NOTE — Plan of Care (Signed)
  Problem: Education: Goal: Knowledge of General Education information will improve Description: Including pain rating scale, medication(s)/side effects and non-pharmacologic comfort measures Outcome: Adequate for Discharge   Problem: Health Behavior/Discharge Planning: Goal: Ability to manage health-related needs will improve Outcome: Adequate for Discharge   Problem: Clinical Measurements: Goal: Ability to maintain clinical measurements within normal limits will improve Outcome: Adequate for Discharge Goal: Will remain free from infection Outcome: Adequate for Discharge Goal: Diagnostic test results will improve Outcome: Adequate for Discharge Goal: Respiratory complications will improve Outcome: Adequate for Discharge Goal: Cardiovascular complication will be avoided Outcome: Adequate for Discharge   Problem: Activity: Goal: Risk for activity intolerance will decrease Outcome: Adequate for Discharge   Problem: Nutrition: Goal: Adequate nutrition will be maintained Outcome: Adequate for Discharge   Problem: Coping: Goal: Level of anxiety will decrease Outcome: Adequate for Discharge   Problem: Elimination: Goal: Will not experience complications related to bowel motility Outcome: Adequate for Discharge Goal: Will not experience complications related to urinary retention Outcome: Adequate for Discharge   Problem: Pain Managment: Goal: General experience of comfort will improve Outcome: Adequate for Discharge   Problem: Safety: Goal: Ability to remain free from injury will improve Outcome: Adequate for Discharge   Problem: Skin Integrity: Goal: Risk for impaired skin integrity will decrease Outcome: Adequate for Discharge  Discharge instructions reviewed with patient.  These included, but were not limited to, the following:  discharge medications, new medications (and rationale for administration), care of nasal packing, when to call the MD,  follow-up appointments,  S&S infection, activity, etc.  Comprehension of instructions were validated via "teach-back" technique.

## 2020-03-25 LAB — BPAM RBC
Blood Product Expiration Date: 202107102359
Blood Product Expiration Date: 202108032359
ISSUE DATE / TIME: 202107060918
Unit Type and Rh: 600
Unit Type and Rh: 6200

## 2020-03-25 LAB — TYPE AND SCREEN
ABO/RH(D): A POS
Antibody Screen: NEGATIVE
Unit division: 0
Unit division: 0

## 2020-04-12 ENCOUNTER — Other Ambulatory Visit: Payer: Self-pay

## 2020-04-12 ENCOUNTER — Other Ambulatory Visit (HOSPITAL_COMMUNITY)
Admission: RE | Admit: 2020-04-12 | Discharge: 2020-04-12 | Disposition: A | Discharge status: Home / Self Care | Payer: Medicare Other | Source: Ambulatory Visit | Attending: Internal Medicine | Admitting: Internal Medicine

## 2020-04-12 DIAGNOSIS — I10 Essential (primary) hypertension: Secondary | ICD-10-CM | POA: Insufficient documentation

## 2020-04-12 LAB — CBC WITH DIFFERENTIAL/PLATELET
Abs Immature Granulocytes: 0.02 10*3/uL (ref 0.00–0.07)
Basophils Absolute: 0.1 10*3/uL (ref 0.0–0.1)
Basophils Relative: 1 %
Eosinophils Absolute: 0.2 10*3/uL (ref 0.0–0.5)
Eosinophils Relative: 3 %
HCT: 35 % — ABNORMAL LOW (ref 36.0–46.0)
Hemoglobin: 10.9 g/dL — ABNORMAL LOW (ref 12.0–15.0)
Immature Granulocytes: 0 %
Lymphocytes Relative: 35 %
Lymphs Abs: 1.8 10*3/uL (ref 0.7–4.0)
MCH: 29.9 pg (ref 26.0–34.0)
MCHC: 31.1 g/dL (ref 30.0–36.0)
MCV: 96.2 fL (ref 80.0–100.0)
Monocytes Absolute: 0.3 10*3/uL (ref 0.1–1.0)
Monocytes Relative: 7 %
Neutro Abs: 2.8 10*3/uL (ref 1.7–7.7)
Neutrophils Relative %: 54 %
Platelets: 469 10*3/uL — ABNORMAL HIGH (ref 150–400)
RBC: 3.64 MIL/uL — ABNORMAL LOW (ref 3.87–5.11)
RDW: 14.2 % (ref 11.5–15.5)
WBC: 5.1 10*3/uL (ref 4.0–10.5)
nRBC: 0 % (ref 0.0–0.2)

## 2020-04-12 LAB — BASIC METABOLIC PANEL
Anion gap: 9 (ref 5–15)
BUN: 14 mg/dL (ref 6–20)
CO2: 23 mmol/L (ref 22–32)
Calcium: 9.6 mg/dL (ref 8.9–10.3)
Chloride: 107 mmol/L (ref 98–111)
Creatinine, Ser: 0.86 mg/dL (ref 0.44–1.00)
GFR calc Af Amer: >60 mL/min (ref >60)
GFR calc non Af Amer: >60 mL/min (ref >60)
Glucose, Bld: 110 mg/dL — ABNORMAL HIGH (ref 70–99)
Potassium: 3.8 mmol/L (ref 3.5–5.1)
Sodium: 139 mmol/L (ref 135–145)

## 2020-04-15 ENCOUNTER — Encounter (HOSPITAL_COMMUNITY): Payer: Self-pay

## 2020-04-15 ENCOUNTER — Observation Stay (HOSPITAL_COMMUNITY): Payer: Medicare Other

## 2020-04-15 ENCOUNTER — Observation Stay (HOSPITAL_COMMUNITY)
Admission: EM | Admit: 2020-04-15 | Discharge: 2020-04-16 | Disposition: A | Discharge status: Home / Self Care | Payer: Medicare Other | Attending: Internal Medicine | Admitting: Internal Medicine

## 2020-04-15 ENCOUNTER — Emergency Department (HOSPITAL_COMMUNITY): Payer: Medicare Other

## 2020-04-15 DIAGNOSIS — F17210 Nicotine dependence, cigarettes, uncomplicated: Secondary | ICD-10-CM | POA: Insufficient documentation

## 2020-04-15 DIAGNOSIS — J45909 Unspecified asthma, uncomplicated: Secondary | ICD-10-CM | POA: Insufficient documentation

## 2020-04-15 DIAGNOSIS — R040 Epistaxis: Principal | ICD-10-CM | POA: Insufficient documentation

## 2020-04-15 DIAGNOSIS — Z79899 Other long term (current) drug therapy: Secondary | ICD-10-CM | POA: Insufficient documentation

## 2020-04-15 DIAGNOSIS — I10 Essential (primary) hypertension: Secondary | ICD-10-CM | POA: Diagnosis not present

## 2020-04-15 DIAGNOSIS — Z20822 Contact with and (suspected) exposure to covid-19: Secondary | ICD-10-CM | POA: Insufficient documentation

## 2020-04-15 DIAGNOSIS — R55 Syncope and collapse: Secondary | ICD-10-CM | POA: Diagnosis not present

## 2020-04-15 DIAGNOSIS — R531 Weakness: Secondary | ICD-10-CM | POA: Diagnosis not present

## 2020-04-15 DIAGNOSIS — R4182 Altered mental status, unspecified: Secondary | ICD-10-CM

## 2020-04-15 HISTORY — DX: Epistaxis: R04.0

## 2020-04-15 LAB — TYPE AND SCREEN
ABO/RH(D): A POS
Antibody Screen: NEGATIVE

## 2020-04-15 LAB — CBC WITH DIFFERENTIAL/PLATELET
Abs Immature Granulocytes: 0.03 10*3/uL (ref 0.00–0.07)
Basophils Absolute: 0.1 10*3/uL (ref 0.0–0.1)
Basophils Relative: 1 %
Eosinophils Absolute: 0 10*3/uL (ref 0.0–0.5)
Eosinophils Relative: 0 %
HCT: 32.2 % — ABNORMAL LOW (ref 36.0–46.0)
Hemoglobin: 10.2 g/dL — ABNORMAL LOW (ref 12.0–15.0)
Immature Granulocytes: 0 %
Lymphocytes Relative: 25 %
Lymphs Abs: 2.9 10*3/uL (ref 0.7–4.0)
MCH: 29.5 pg (ref 26.0–34.0)
MCHC: 31.7 g/dL (ref 30.0–36.0)
MCV: 93.1 fL (ref 80.0–100.0)
Monocytes Absolute: 0.6 10*3/uL (ref 0.1–1.0)
Monocytes Relative: 6 %
Neutro Abs: 7.8 10*3/uL — ABNORMAL HIGH (ref 1.7–7.7)
Neutrophils Relative %: 68 %
Platelets: 402 10*3/uL — ABNORMAL HIGH (ref 150–400)
RBC: 3.46 MIL/uL — ABNORMAL LOW (ref 3.87–5.11)
RDW: 13.9 % (ref 11.5–15.5)
WBC: 11.4 10*3/uL — ABNORMAL HIGH (ref 4.0–10.5)
nRBC: 0 % (ref 0.0–0.2)

## 2020-04-15 LAB — SARS CORONAVIRUS 2 BY RT PCR (HOSPITAL ORDER, PERFORMED IN ~~LOC~~ HOSPITAL LAB): SARS Coronavirus 2: NEGATIVE

## 2020-04-15 LAB — RAPID URINE DRUG SCREEN, HOSP PERFORMED
Amphetamines: NOT DETECTED
Barbiturates: NOT DETECTED
Benzodiazepines: NOT DETECTED
Cocaine: POSITIVE — AB
Opiates: NOT DETECTED
Tetrahydrocannabinol: NOT DETECTED

## 2020-04-15 LAB — COMPREHENSIVE METABOLIC PANEL
ALT: 20 U/L (ref 0–44)
AST: 22 U/L (ref 15–41)
Albumin: 4.9 g/dL (ref 3.5–5.0)
Alkaline Phosphatase: 106 U/L (ref 38–126)
Anion gap: 12 (ref 5–15)
BUN: 12 mg/dL (ref 6–20)
CO2: 19 mmol/L — ABNORMAL LOW (ref 22–32)
Calcium: 9.5 mg/dL (ref 8.9–10.3)
Chloride: 108 mmol/L (ref 98–111)
Creatinine, Ser: 0.93 mg/dL (ref 0.44–1.00)
GFR calc Af Amer: >60 mL/min (ref >60)
GFR calc non Af Amer: >60 mL/min (ref >60)
Glucose, Bld: 93 mg/dL (ref 70–99)
Potassium: 3.4 mmol/L — ABNORMAL LOW (ref 3.5–5.1)
Sodium: 139 mmol/L (ref 135–145)
Total Bilirubin: 0.4 mg/dL (ref 0.3–1.2)
Total Protein: 8.4 g/dL — ABNORMAL HIGH (ref 6.5–8.1)

## 2020-04-15 LAB — PROTIME-INR
INR: 1 (ref 0.8–1.2)
Prothrombin Time: 12.8 seconds (ref 11.4–15.2)

## 2020-04-15 LAB — CBG MONITORING, ED: Glucose-Capillary: 102 mg/dL — ABNORMAL HIGH (ref 70–99)

## 2020-04-15 LAB — ETHANOL: Alcohol, Ethyl (B): 79 mg/dL — ABNORMAL HIGH (ref <10)

## 2020-04-15 MED ORDER — SODIUM CHLORIDE 0.9% FLUSH: 3.0000 mL | INTRAVENOUS | Status: DC | PRN

## 2020-04-15 MED ORDER — ACETAMINOPHEN 650 MG RE SUPP: 650.0000 mg | Freq: Four times a day (QID) | RECTAL | Status: DC | PRN

## 2020-04-15 MED ORDER — PANTOPRAZOLE SODIUM 40 MG PO TBEC
40.0000 mg | DELAYED_RELEASE_TABLET | Freq: Every day | ORAL | Status: DC
  Administered 2020-04-16: 40 mg via ORAL
  Filled 2020-04-15 (×2): qty 1

## 2020-04-15 MED ORDER — TOPIRAMATE 100 MG PO TABS
100.0000 mg | ORAL_TABLET | Freq: Every day | ORAL | Status: DC
  Administered 2020-04-16: 100 mg via ORAL
  Filled 2020-04-15: qty 1
  Filled 2020-04-15: qty 4

## 2020-04-15 MED ORDER — LORAZEPAM 1 MG PO TABS: 1.0000 mg | ORAL_TABLET | ORAL | Status: DC | PRN

## 2020-04-15 MED ORDER — VITAMIN D 25 MCG (1000 UNIT) PO TABS
5000.0000 [IU] | ORAL_TABLET | Freq: Every day | ORAL | Status: DC
  Administered 2020-04-15 – 2020-04-16 (×2): 5000 [IU] via ORAL
  Filled 2020-04-15 (×2): qty 5

## 2020-04-15 MED ORDER — THIAMINE HCL 100 MG PO TABS
100.0000 mg | ORAL_TABLET | Freq: Every day | ORAL | Status: DC
  Administered 2020-04-15 – 2020-04-16 (×2): 100 mg via ORAL
  Filled 2020-04-15 (×2): qty 1

## 2020-04-15 MED ORDER — FOLIC ACID 1 MG PO TABS
1.0000 mg | ORAL_TABLET | Freq: Every day | ORAL | Status: DC
  Administered 2020-04-15 – 2020-04-16 (×2): 1 mg via ORAL
  Filled 2020-04-15 (×2): qty 1

## 2020-04-15 MED ORDER — DIPHENHYDRAMINE HCL 25 MG PO CAPS: 25.0000 mg | ORAL_CAPSULE | Freq: Every evening | ORAL | Status: DC | PRN

## 2020-04-15 MED ORDER — ADULT MULTIVITAMIN W/MINERALS CH
1.0000 | ORAL_TABLET | Freq: Every day | ORAL | Status: DC
  Administered 2020-04-15 – 2020-04-16 (×2): 1 via ORAL
  Filled 2020-04-15 (×2): qty 1

## 2020-04-15 MED ORDER — AMLODIPINE BESYLATE 5 MG PO TABS
5.0000 mg | ORAL_TABLET | Freq: Every day | ORAL | Status: DC
  Administered 2020-04-15 – 2020-04-16 (×2): 5 mg via ORAL
  Filled 2020-04-15 (×2): qty 1

## 2020-04-15 MED ORDER — BACLOFEN 10 MG PO TABS: 10.0000 mg | ORAL_TABLET | Freq: Two times a day (BID) | ORAL | Status: DC | PRN

## 2020-04-15 MED ORDER — TRANEXAMIC ACID FOR EPISTAXIS
500.0000 mg | Freq: Once | TOPICAL | Status: AC
  Administered 2020-04-15: 500 mg via TOPICAL
  Filled 2020-04-15: qty 10

## 2020-04-15 MED ORDER — DOCUSATE SODIUM 100 MG PO CAPS: 100.0000 mg | ORAL_CAPSULE | Freq: Every day | ORAL | Status: DC | PRN

## 2020-04-15 MED ORDER — SODIUM CHLORIDE 0.9% FLUSH
3.0000 mL | Freq: Two times a day (BID) | INTRAVENOUS | Status: DC
  Administered 2020-04-16: 3 mL via INTRAVENOUS

## 2020-04-15 MED ORDER — ONDANSETRON HCL 4 MG PO TABS: 4.0000 mg | ORAL_TABLET | Freq: Four times a day (QID) | ORAL | Status: DC | PRN

## 2020-04-15 MED ORDER — LORAZEPAM 2 MG/ML IJ SOLN: 1.0000 mg | INTRAMUSCULAR | Status: DC | PRN

## 2020-04-15 MED ORDER — POTASSIUM CHLORIDE CRYS ER 20 MEQ PO TBCR
40.0000 meq | EXTENDED_RELEASE_TABLET | Freq: Once | ORAL | Status: AC
  Administered 2020-04-15: 40 meq via ORAL
  Filled 2020-04-15: qty 2

## 2020-04-15 MED ORDER — ONDANSETRON HCL 4 MG/2ML IJ SOLN: 4.0000 mg | Freq: Four times a day (QID) | INTRAMUSCULAR | Status: DC | PRN

## 2020-04-15 MED ORDER — SODIUM CHLORIDE 0.9 % IV SOLN: 250.0000 mL | INTRAVENOUS | Status: DC | PRN

## 2020-04-15 MED ORDER — ACETAMINOPHEN 325 MG PO TABS
650.0000 mg | ORAL_TABLET | Freq: Four times a day (QID) | ORAL | Status: DC | PRN
  Administered 2020-04-16: 650 mg via ORAL
  Filled 2020-04-15: qty 2

## 2020-04-15 NOTE — ED Provider Notes (Signed)
Emergency Department Provider Note   I have reviewed the triage vital signs and the nursing notes.   HISTORY  Chief Complaint Epistaxis and Altered Mental Status   HPI Kirsten Walls is a 54 y.o. female with past medical history reviewed below presents to the emergency department by EMS who arrived emergency traffic with patient unresponsive by report. They were initially called out with epistaxis and found the patient to be bleeding from the nose on arrival. She was awake, alert, able to ambulate to the stretcher. Once in the ambulance the patient had an acute change in mental status becoming less responsive. They were able to perform a brief stroke screen and noted her left side to be completely flaccid. She remained minimally responsive in route but returned to her mental status baseline shortly after arrival in the emergency department.   Level 5 caveat: AMS  Past Medical History:  Diagnosis Date  . Arthritis   . Asthma   . Bronchitis   . Chronic shoulder pain   . Epistaxis   . Hypertension   . Insomnia   . Seizures [facility])     Patient Active Problem List   Diagnosis Date Noted  . Syncope 04/15/2020  . Epistaxis 03/23/2020  . Leukocytosis 03/23/2020  . Acute blood loss anemia 03/23/2020  . GERD (gastroesophageal reflux disease) 03/23/2020  . Tobacco use 03/23/2020  . Insomnia related to another mental disorder 07/30/2017  . Chronic insomnia 07/30/2017  . History of colonic polyps   . Diverticulosis of colon without hemorrhage   . Encounter for screening colonoscopy 07/27/2015  . Polypharmacy 07/27/2015  . Arthritis, shoulder region 04/27/2014  . Acute bursitis of right shoulder 02/16/2014  . Shoulder injury 02/16/2014  . 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 BUNION 03/17/2009  . H N P-LUMBAR 06/03/2008  . KNEE, ARTHRITIS, DEGEN./OSTEO 02/11/2008  . NEOPLASMS UNSPEC NATURE BONE SOFT TISSUE&SKIN 02/03/2008  . LOW BACK PAIN 02/03/2008  . DERANGEMENT MENISCUS  01/23/2008  . JOINT EFFUSION, LEFT KNEE 01/23/2008  . TEAR MEDIAL MENISCUS 01/23/2008    Past Surgical History:  Procedure Laterality Date  . ABDOMINAL HYSTERECTOMY    . BUNIONECTOMY  both big toe  . CESAREAN SECTION  x2  . COLONOSCOPY N/A 08/18/2015   Procedure: COLONOSCOPY;  Surgeon: Daneil Dolin, MD;  Location: [facility];  Service: Endoscopy;  Laterality: N/A;  1000  . FOOT SURGERY    . KNEE SURGERY    . SHOULDER ARTHROSCOPY    . TUMOR REMOVAL  left knee cap    Allergies Penicillins, Azithromycin, and Sulfamethoxazole-trimethoprim  Family History  Problem Relation Age of Onset  . Hypertension Mother   . Hyperlipidemia Mother   . Diabetes Mother   . Asthma Other   . Colon cancer Neg Hx     Social History Social History   Tobacco Use  . Smoking status: Heavy Tobacco Smoker    Packs/day: 0.50    Years: 30.00    Pack years: 15.00    Types: Cigarettes  . Smokeless tobacco: Never Used  . Tobacco comment: social  Substance Use Topics  . Alcohol use: Yes    Comment: social  . Drug use: No    Review of Systems  Constitutional: No fever/chills Eyes: No visual changes. ENT: No sore throat. Positive epistaxis.  Cardiovascular: Denies chest pain. Positive unresponsive episode.  Respiratory: Denies shortness of breath. Gastrointestinal: No abdominal pain.  No nausea, no vomiting.  No diarrhea.  No constipation. Genitourinary: Negative for dysuria. Musculoskeletal: Negative  for back pain. Skin: Negative for rash. Neurological: Negative for headaches or numbness. Left side weakness with EMS en route (resolved).   10-point ROS otherwise negative.  ____________________________________________   PHYSICAL EXAM:  VITAL SIGNS: Vitals:   04/15/20 1135 04/15/20 1137  BP: (!) 152/94   Pulse: (!) 107   Resp: 18   Temp:  98.3 F (36.8 C)  SpO2: 97%     Constitutional: Patient awakens to voice on arrival to the ED and answering questions appropriately.    Eyes: Conjunctivae are normal. PERRL.  Head: Atraumatic. Nose: No congestion/rhinnorhea. Mild oozing epistaxis.  Mouth/Throat: Mucous membranes are moist. Neck: No stridor.  Cardiovascular: Normal rate, regular rhythm. Good peripheral circulation. Grossly normal heart sounds.   Respiratory: Normal respiratory effort.  No retractions. Lungs CTAB. Gastrointestinal: Soft and nontender. No distention.  Musculoskeletal: No lower extremity tenderness nor edema. No gross deformities of extremities. Neurologic:  Normal speech and language. No gross focal neurologic deficits are appreciated.  Skin:  Skin is warm, dry and intact. No rash noted.   ____________________________________________   LABS (all labs ordered are listed, but only abnormal results are displayed)  Labs Reviewed  COMPREHENSIVE METABOLIC PANEL - Abnormal; Notable for the following components:      Result Value   Potassium 3.4 (*)    CO2 19 (*)    Total Protein 8.4 (*)    All other components within normal limits  CBC WITH DIFFERENTIAL/PLATELET - Abnormal; Notable for the following components:   WBC 11.4 (*)    RBC 3.46 (*)    Hemoglobin 10.2 (*)    HCT 32.2 (*)    Platelets 402 (*)    Neutro Abs 7.8 (*)    All other components within normal limits  ETHANOL - Abnormal; Notable for the following components:   Alcohol, Ethyl (B) 79 (*)    All other components within normal limits  RAPID URINE DRUG SCREEN, HOSP PERFORMED - Abnormal; Notable for the following components:   Cocaine POSITIVE (*)    All other components within normal limits  CBG MONITORING, ED - Abnormal; Notable for the following components:   Glucose-Capillary 102 (*)    All other components within normal limits  SARS CORONAVIRUS 2 BY RT PCR (HOSPITAL ORDER, [place] [facility])  PROTIME-INR  TYPE AND SCREEN   ____________________________________________  EKG   EKG Interpretation  Date/Time:  Thursday April 15 2020 12:17:40  EDT Ventricular Rate:  112 PR Interval:    QRS Duration: 67 QT Interval:  293 QTC Calculation: 400 R Axis:   9 Text Interpretation: Sinus tachycardia LVH with secondary repolarization abnormality No STEMI Confirmed by Nanda Quinton [phone]) on 04/15/2020 3:13:00 PM       ____________________________________________  RADIOLOGY  CT Head Wo Contrast  Result Date: 04/15/2020 CLINICAL DATA:  Mental status change, unknown cause. Additional provided: Headache, nose bleed, decreased responsiveness EXAM: CT HEAD WITHOUT CONTRAST TECHNIQUE: Contiguous axial images were obtained from the base of the skull through the vertex without intravenous contrast. COMPARISON:  Brain MRI [id number], head CT 10/09/2011. FINDINGS: Brain: Cerebral volume is normal for age. There is no acute intracranial hemorrhage. No demarcated cortical infarct. No extra-axial fluid collection. No evidence of intracranial mass. No midline shift. Vascular: No hyperdense vessel.  Atherosclerotic calcifications Skull: Normal. Negative for fracture or focal lesion. Sinuses/Orbits: Visualized orbits show no acute finding. Moderate mucosal thickening and secretions within the visualized ethmoid and maxillary sinuses. No significant mastoid effusion. Soft tissue prominence and/or fluid within  the partially imaged left nasal passage. IMPRESSION: Unremarkable non-contrast CT appearance of the brain. No evidence of acute intracranial abnormality Moderate mucosal thickening and secretions within the visualized ethmoid and maxillary sinuses. Correlate for acute sinusitis. Soft tissue prominence and/or fluid within the partially imaged left nasal passage. Direct visualization recommended. Electronically Signed   By: Kellie Simmering DO   On: 04/15/2020 13:33    ____________________________________________   PROCEDURES  Procedure(s) performed:   .Epistaxis Management  Date/Time: 04/15/2020 3:13 PM Performed by: Margette Fast, MD Authorized by: Margette Fast, MD   Consent:    Consent obtained:  Emergent situation   Consent given by:  Patient   Risks discussed:  Bleeding, infection, nasal injury and pain Anesthesia (see MAR for exact dosages):    Anesthesia method:  None Procedure details:    Treatment site:  L anterior   Treatment method:  Anterior pack (4.5 cm rhino rocket with TXA)   Treatment complexity:  Extensive   Treatment episode: recurring   Post-procedure details:    Assessment:  Bleeding stopped   Patient tolerance of procedure:  Tolerated well, no immediate complications .Critical Care Performed by: Margette Fast, MD Authorized by: Margette Fast, MD   Critical care provider statement:    Critical care time (minutes):  35   Critical care time was exclusive of:  Separately billable procedures and treating other patients and teaching time   Critical care was necessary to treat or prevent imminent or life-threatening deterioration of the following conditions:  Circulatory failure   Critical care was time spent personally by me on the following activities:  Discussions with consultants, evaluation of patient's response to treatment, examination of patient, ordering and performing treatments and interventions, ordering and review of laboratory studies, ordering and review of radiographic studies, pulse oximetry, re-evaluation of patient's condition, obtaining history from patient or surrogate, review of old charts, blood draw for specimens and development of treatment plan with patient or surrogate   I assumed direction of critical care for this patient from another provider in my specialty: no       ____________________________________________   INITIAL IMPRESSION / ASSESSMENT AND PLAN / ED COURSE  Pertinent labs & imaging results that were available during my care of the patient were reviewed by me and considered in my medical decision making (see chart for details).   Patient arrives to the emergency department  with epistaxis and episode of unresponsiveness with EMS. EMS report acute left-sided weakness which has since resolved. Patient is hypertensive. In review of the recent medical record earlier this month the patient was admitted with blood loss anemia related to epistaxis. She saw Dr. Redmond Baseman in the office to remove the nasal packing on 7/12. Patient is protecting her airway at this time and is more alert. Will send for stat head CT along with lab work. No witnessed seizure activity per EMS. TIA is a consideration vs syncope.   Epistaxis ultimately controlled but large volume in the ED and at home. No PRBC transfusion required at this time but given blood loss suspect that Hb will trend down. CT head and labs reviewed. Additional unresponsive episode in the ED without neuro deficit or seizure activity in the ED. Plan for obs admit.   Discussed case with Dr. Marcelline Deist with ENT. Agrees with f/u plan for patient to have packing removed as outpatient, Dr. Redmond Baseman, as was done in the past. If primary team requires a consult as inpatient she will have to be  transferred to [facility] or [facility] but otherwise and f/u as outpatient.   Discussed patient's case with [facility], Dr. Manuella Ghazi to request admission. Patient and family (if present) updated with plan. Care transferred to [facility] service.  I reviewed all nursing notes, vitals, pertinent old records, EKGs, labs, imaging (as available).  ____________________________________________  FINAL CLINICAL IMPRESSION(S) / ED DIAGNOSES  Final diagnoses:  Epistaxis  Syncope, unspecified syncope type     MEDICATIONS GIVEN DURING THIS VISIT:  Medications  potassium chloride SA (KLOR-CON) CR tablet 40 mEq (has no administration in time range)  diphenhydrAMINE (BENADRYL) capsule 25 mg (has no administration in time range)  docusate sodium (COLACE) capsule 100 mg (has no administration in time range)  pantoprazole (PROTONIX) EC tablet 40 mg (has no administration in time range)  baclofen  (LIORESAL) tablet 10 mg (has no administration in time range)  topiramate (TOPAMAX) tablet 100 mg (has no administration in time range)  cholecalciferol (VITAMIN D3) tablet 5,000 Units (has no administration in time range)  sodium chloride flush (NS) 0.9 % injection 3 mL (has no administration in time range)  sodium chloride flush (NS) 0.9 % injection 3 mL (has no administration in time range)  0.9 %  sodium chloride infusion (has no administration in time range)  acetaminophen (TYLENOL) tablet 650 mg (has no administration in time range)    Or  acetaminophen (TYLENOL) suppository 650 mg (has no administration in time range)  ondansetron (ZOFRAN) tablet 4 mg (has no administration in time range)    Or  ondansetron (ZOFRAN) injection 4 mg (has no administration in time range)  thiamine tablet 100 mg (has no administration in time range)  amLODipine (NORVASC) tablet 5 mg (has no administration in time range)  LORazepam (ATIVAN) tablet 1-4 mg (has no administration in time range)    Or  LORazepam (ATIVAN) injection 1-4 mg (has no administration in time range)  folic acid (FOLVITE) tablet 1 mg (has no administration in time range)  multivitamin with minerals tablet 1 tablet (has no administration in time range)  tranexamic acid (CYKLOKAPRON) 1000 MG/10ML topical solution 500 mg (500 mg Topical Given by Other 04/15/20 1229)    Note:  This document was prepared using Dragon voice recognition software and may include unintentional dictation errors.  Nanda Quinton, MD, [facility] Emergency Medicine    Ariv Penrod, [facility], MD 04/15/20 [phone]

## 2020-04-15 NOTE — ED Notes (Signed)
Lab went in to draw blood  Pt states she was going to pass out and pt went unresponsive for several minutes. EDP in room. Pt stating something isnt right. Alert, but restless in bed

## 2020-04-15 NOTE — ED Notes (Signed)
Pt laying in bed looking at phone.

## 2020-04-15 NOTE — ED Triage Notes (Signed)
Pt called EMS due to nose bleed x 10 mins. Pt was ambulatory to the truck then went unresponsive. Pt slowly becoming responsive when arrived to ED. Pt bleeding out of both nostril, BP 185/113. Pt is no longer taking norvasc. Pt reports HA and no No longer taking norvasc

## 2020-04-15 NOTE — Patient Outreach (Signed)
[facility]) Care Management  04/15/2020  Kirsten Walls 05-16-1966 [id number]  FYI: Received referral for the patient from Dr Rosita Fire by fax. Unfortunately, the patient is not on the current member enrollment rosters for any of the [facility] risk contracted plans.    Patient's provider has been notified by fax that we would not be able to assist with this patient.

## 2020-04-15 NOTE — ED Notes (Signed)
Pt conts to be spitting blood in bag and. Gauze is saturated with blood in nostrils. EDP notified. Rhino rocket placed in left nostril by EDP

## 2020-04-15 NOTE — ED Notes (Addendum)
Pt taken to MRI  

## 2020-04-15 NOTE — H&P (Addendum)
History and Physical    Kirsten Walls IRJ:[id number] DOB: 09/27/1965 DOA: 04/15/2020  PCP: Rosita Fire, MD   Patient coming from: Home  Chief Complaint: Epistaxis/AMS  HPI: Kirsten Walls is a 54 y.o. female with medical history significant for hypertension, recurrent epistaxis, seizure disorder, tobacco abuse, alcohol abuse, and GERD who was brought to the ED via EMS as she was noted to be unresponsive.  EMS noted that patient had significant epistaxis and she was able to be awakened and ambulated to the stretcher.  In route, patient had an acute mental status change and became less responsive and was noted to have flaccidity of her left side.  She remained minimally responsive in route, but returned to her baseline mental status shortly after arrival to the ED.  There was no sign of any tremors or seizure activity, urinary incontinence, or tongue biting noted.  She does not appear to have any significant extremity motor strength or sensory deficits or speech deficit noted.  Notably, she was recently discharged on 03/24/2020 with significant epistaxis at that time that required 2 units of PRBCs.  She is thought to have a recurrence of her bleeding on account of poorly controlled blood pressure at home.   ED Course: Stable vital signs noted, but she does have some sinus tachycardia on EKG.  Current telemetry pulse 80-90 bpm.  She is noted to have potassium 3.4, mild leukocytosis of 11,400, and hemoglobin 10.2 which appears to be stable compared to 7/26.  Platelets are 402,000.  CT head negative for any findings aside from some acute sinusitis.  Blood alcohol level noted to be 79.  Review of Systems: All others reviewed and otherwise negative except as noted above.  Past Medical History:  Diagnosis Date  . Arthritis   . Asthma   . Bronchitis   . Chronic shoulder pain   . Epistaxis   . Hypertension   . Insomnia   . Seizures ([place])     Past Surgical History:  Procedure Laterality  Date  . ABDOMINAL HYSTERECTOMY    . BUNIONECTOMY  both big toe  . CESAREAN SECTION  x2  . COLONOSCOPY N/A 08/18/2015   Procedure: COLONOSCOPY;  Surgeon: Daneil Dolin, MD;  Location: [facility];  Service: Endoscopy;  Laterality: N/A;  1000  . FOOT SURGERY    . KNEE SURGERY    . SHOULDER ARTHROSCOPY    . TUMOR REMOVAL  left knee cap     reports that she has been smoking cigarettes. She has a 15.00 pack-year smoking history. She has never used smokeless tobacco. She reports current alcohol use. She reports that she does not use drugs.  Allergies  Allergen Reactions  . Penicillins Hives    Did it involve swelling of the face/tongue/throat, SOB, or low BP? Yes Did it involve sudden or severe rash/hives, skin peeling, or any reaction on the inside of your mouth or nose? No Did you need to seek medical attention at a hospital or doctor's office? Yes When did it last happen?Over 10 years ago-childhood If all above answers are "NO", may proceed with cephalosporin use.   . Azithromycin Rash  . Sulfamethoxazole-Trimethoprim Hives and Rash    Family History  Problem Relation Age of Onset  . Hypertension Mother   . Hyperlipidemia Mother   . Diabetes Mother   . Asthma Other   . Colon cancer Neg Hx     Prior to Admission medications   Medication Sig Start Date End Date Taking? Authorizing Provider  albuterol (VENTOLIN HFA) 108 (90 BASE) MCG/ACT inhaler Inhale 2 puffs into the lungs every 6 (six) hours as needed. For shortness of breath/asthma/bronchitis    Yes [provider]  baclofen (LIORESAL) 10 MG tablet Take 10 mg by mouth 2 (two) times daily as needed for muscle spasms.    Yes [provider]  Cholecalciferol (VITAMIN D3) 125 MCG (5000 UT) CAPS Take 5,000 Units by mouth daily.   Yes [provider]  diphenhydramine-acetaminophen (TYLENOL PM) 25-500 MG TABS tablet Take 1 tablet by mouth at bedtime as needed (sleep, allergies).   Yes [provider]  docusate sodium (COLACE) 100 MG capsule Take 100 mg by mouth daily as needed for mild constipation.   Yes [provider]  EPINEPHrine (EPIPEN 2-PAK) 0.3 mg/0.3 mL IJ SOAJ injection Inject 0.3 mLs (0.3 mg total) into the muscle as needed for anaphylaxis. 01/15/19  Yes Fredia Sorrow, MD  esomeprazole (NEXIUM) 40 MG capsule Take 40 mg by mouth daily. 03/17/20  Yes [provider]  Multiple Vitamin (MULTIVITAMIN PO) Take 1 tablet by mouth daily.   Yes [provider]  oxymetazoline (AFRIN) 0.05 % nasal spray Place 1 spray into both nostrils 2 (two) times daily as needed for congestion.   Yes [provider]  topiramate (TOPAMAX) 25 MG tablet Take 100 mg by mouth daily.   Yes [provider]  cephALEXin (KEFLEX) 500 MG capsule Take 1 capsule (500 mg total) by mouth every 12 (twelve) hours. Patient not taking: Reported on 04/15/2020 03/24/20   Thurnell Lose, MD  famotidine (PEPCID) 20 MG tablet Take 1 tablet (20 mg total) by mouth 2 (two) times daily. Patient not taking: Reported on 03/23/2020 01/15/19   Fredia Sorrow, MD    Physical Exam: Vitals:   04/15/20 1135 04/15/20 1136 04/15/20 1137  BP: (!) 152/94    Pulse: (!) 107    Resp: 18    Temp:   98.3 F (36.8 C)  TempSrc:   Oral  SpO2: 97%    Weight:  67 kg   Height:  5\' 3"  (1.6 m)     Constitutional: NAD, calm, comfortable Vitals:   04/15/20 1135 04/15/20 1136 04/15/20 1137  BP: (!) 152/94    Pulse: (!) 107    Resp: 18    Temp:   98.3 F (36.8 C)  TempSrc:   Oral  SpO2: 97%    Weight:  67 kg   Height:  5\' 3"  (1.6 m)    Eyes: lids and conjunctivae normal ENMT: Mucous membranes are moist.  Currently with Rhino Rocket to left nare. Neck: normal, supple Respiratory: clear to auscultation bilaterally. Normal respiratory effort. No accessory muscle use.  Cardiovascular: Regular rate and rhythm, no murmurs. No extremity edema. Abdomen: no tenderness, no distention.  Bowel sounds positive.  Musculoskeletal:  No joint deformity upper and lower extremities.   Skin: no rashes, lesions, ulcers.  Psychiatric: Normal judgment and insight. Alert and oriented x 3. Normal mood.   Labs on Admission: I have personally reviewed following labs and imaging studies  CBC: Recent Labs  Lab 04/12/20 1000 04/15/20 1141  WBC 5.1 11.4*  NEUTROABS 2.8 7.8*  HGB 10.9* 10.2*  HCT 35.0* 32.2*  MCV 96.2 93.1  PLT 469* 546*   Basic Metabolic Panel: Recent Labs  Lab 04/12/20 1000 04/15/20 1141  NA 139 139  K 3.8 3.4*  CL 107 108  CO2 23 19*  GLUCOSE 110* 93  BUN 14 12  CREATININE 0.86  0.93  CALCIUM 9.6 9.5   GFR: Estimated Creatinine Clearance: 62.8 mL/min (by C-G formula based on SCr of 0.93 mg/dL). Liver Function Tests: Recent Labs  Lab 04/15/20 1141  AST 22  ALT 20  ALKPHOS 106  BILITOT 0.4  PROT 8.4*  ALBUMIN 4.9   No results for input(s): LIPASE, AMYLASE in the last 168 hours. No results for input(s): AMMONIA in the last 168 hours. Coagulation Profile: Recent Labs  Lab 04/15/20 1141  INR 1.0   Cardiac Enzymes: No results for input(s): CKTOTAL, CKMB, CKMBINDEX, TROPONINI in the last 168 hours. BNP (last 3 results) No results for input(s): PROBNP in the last 8760 hours. HbA1C: No results for input(s): HGBA1C in the last 72 hours. CBG: Recent Labs  Lab 04/15/20 1229  GLUCAP 102*   Lipid Profile: No results for input(s): CHOL, HDL, LDLCALC, TRIG, CHOLHDL, LDLDIRECT in the last 72 hours. Thyroid Function Tests: No results for input(s): TSH, T4TOTAL, FREET4, T3FREE, THYROIDAB in the last 72 hours. Anemia Panel: No results for input(s): VITAMINB12, FOLATE, FERRITIN, TIBC, IRON, RETICCTPCT in the last 72 hours. Urine analysis:    Component Value Date/Time   COLORURINE YELLOW 03/24/2010 1650   APPEARANCEUR CLEAR 03/24/2010 1650   LABSPEC 1.020 03/24/2010 1650   PHURINE 6.0 03/24/2010 1650   GLUCOSEU NEGATIVE 03/24/2010 1650   HGBUR  NEGATIVE 03/24/2010 1650   BILIRUBINUR NEGATIVE 03/24/2010 1650   KETONESUR TRACE (A) 03/24/2010 1650   PROTEINUR NEGATIVE 03/24/2010 1650   UROBILINOGEN 0.2 03/24/2010 1650   NITRITE NEGATIVE 03/24/2010 1650   LEUKOCYTESUR  03/24/2010 1650    NEGATIVE MICROSCOPIC NOT DONE ON URINES WITH NEGATIVE PROTEIN, BLOOD, LEUKOCYTES, NITRITE, OR GLUCOSE <1000 mg/dL.    Radiological Exams on Admission: CT Head Wo Contrast  Result Date: 04/15/2020 CLINICAL DATA:  Mental status change, unknown cause. Additional provided: Headache, nose bleed, decreased responsiveness EXAM: CT HEAD WITHOUT CONTRAST TECHNIQUE: Contiguous axial images were obtained from the base of the skull through the vertex without intravenous contrast. COMPARISON:  Brain MRI [id number], head CT 10/09/2011. FINDINGS: Brain: Cerebral volume is normal for age. There is no acute intracranial hemorrhage. No demarcated cortical infarct. No extra-axial fluid collection. No evidence of intracranial mass. No midline shift. Vascular: No hyperdense vessel.  Atherosclerotic calcifications Skull: Normal. Negative for fracture or focal lesion. Sinuses/Orbits: Visualized orbits show no acute finding. Moderate mucosal thickening and secretions within the visualized ethmoid and maxillary sinuses. No significant mastoid effusion. Soft tissue prominence and/or fluid within the partially imaged left nasal passage. IMPRESSION: Unremarkable non-contrast CT appearance of the brain. No evidence of acute intracranial abnormality Moderate mucosal thickening and secretions within the visualized ethmoid and maxillary sinuses. Correlate for acute sinusitis. Soft tissue prominence and/or fluid within the partially imaged left nasal passage. Direct visualization recommended. Electronically Signed   By: Kellie Simmering DO   On: 04/15/2020 13:33    EKG: Independently reviewed. ST 112bpm with [facility].  Assessment/Plan Active Problems:   Syncope    Recurrent left epistaxis -Likely  secondary to some poor blood pressure control -Patient had recently discontinued amlodipine, because she was apparently told not to take the medication by ENT, but I do not see this on the records -It appears that she was instructed to avoid nose blowing for 1 week and use saline spray several times a day, reviewed office visit from 03/29/2020 with Dr. Redmond Baseman  Acute encephalopathy-resolved with possible syncope -Further evaluation while inpatient to include 2D echocardiogram and carotid ultrasound -No sign of seizure activity and patient  has been compliant with her home medications -Telemetry monitoring -CT head with no acute findings except for signs of sinusitis, but patient clinically denies any symptoms -Brain MRI ordered and pending -PT evaluation  Mild hypokalemia -Replete and reevaluate in a.m.  History of hypertension -Currently appears controlled -Resume home blood pressure agents as well as amlodipine and monitor  History of GERD -PPI  History of seizure disorder -Continue home medications  History of alcohol abuse -With current intoxication -Counseled on cessation -CIWA protocol  History of tobacco abuse -Counseled on cessation   DVT prophylaxis:SCDs  Code Status: Full Family Communication: None at bedside, pt to call her sister Disposition Plan:Evaluation of syncope/possible TIA Consults called:None, EDP spoke with ENT Dr. Marcelline Deist Admission status: Obs, tele   Shanta Hartner D Manuella Ghazi DO [facility]  If 7PM-7AM, please contact night-coverage [email or web address]  04/15/2020, 2:20 PM

## 2020-04-16 ENCOUNTER — Other Ambulatory Visit (HOSPITAL_COMMUNITY): Payer: Medicare Other

## 2020-04-16 DIAGNOSIS — R55 Syncope and collapse: Secondary | ICD-10-CM | POA: Diagnosis not present

## 2020-04-16 DIAGNOSIS — R040 Epistaxis: Secondary | ICD-10-CM | POA: Diagnosis not present

## 2020-04-16 LAB — CBC
HCT: 27.1 % — ABNORMAL LOW (ref 36.0–46.0)
Hemoglobin: 8.5 g/dL — ABNORMAL LOW (ref 12.0–15.0)
MCH: 29.6 pg (ref 26.0–34.0)
MCHC: 31.4 g/dL (ref 30.0–36.0)
MCV: 94.4 fL (ref 80.0–100.0)
Platelets: 341 10*3/uL (ref 150–400)
RBC: 2.87 MIL/uL — ABNORMAL LOW (ref 3.87–5.11)
RDW: 14 % (ref 11.5–15.5)
WBC: 8.1 10*3/uL (ref 4.0–10.5)
nRBC: 0 % (ref 0.0–0.2)

## 2020-04-16 LAB — BASIC METABOLIC PANEL
Anion gap: 8 (ref 5–15)
BUN: 17 mg/dL (ref 6–20)
CO2: 20 mmol/L — ABNORMAL LOW (ref 22–32)
Calcium: 9.4 mg/dL (ref 8.9–10.3)
Chloride: 109 mmol/L (ref 98–111)
Creatinine, Ser: 1.02 mg/dL — ABNORMAL HIGH (ref 0.44–1.00)
GFR calc Af Amer: >60 mL/min (ref >60)
GFR calc non Af Amer: >60 mL/min (ref >60)
Glucose, Bld: 109 mg/dL — ABNORMAL HIGH (ref 70–99)
Potassium: 4 mmol/L (ref 3.5–5.1)
Sodium: 137 mmol/L (ref 135–145)

## 2020-04-16 LAB — HEMOGLOBIN AND HEMATOCRIT, BLOOD
HCT: 31.5 % — ABNORMAL LOW (ref 36.0–46.0)
Hemoglobin: 9.8 g/dL — ABNORMAL LOW (ref 12.0–15.0)

## 2020-04-16 LAB — MRSA PCR SCREENING: MRSA by PCR: NEGATIVE

## 2020-04-16 LAB — MAGNESIUM: Magnesium: 2 mg/dL (ref 1.7–2.4)

## 2020-04-16 MED ORDER — CHLORHEXIDINE GLUCONATE CLOTH 2 % EX PADS
6.0000 | MEDICATED_PAD | Freq: Every day | CUTANEOUS | Status: DC
  Administered 2020-04-16: 6 via TOPICAL

## 2020-04-16 MED ORDER — AMLODIPINE BESYLATE 5 MG PO TABS: 5.0000 mg | ORAL_TABLET | Freq: Every day | ORAL | 2 refills | Status: DC

## 2020-04-16 NOTE — Progress Notes (Signed)
Discharge instructions provided to patient. Patient verbalized understanding of discharge instructions and had no further questions.

## 2020-04-16 NOTE — TOC Transition Note (Signed)
Transition of Care [facility]) - CM/SW Discharge Note   Patient Details  Name: Kirsten Walls MRN: [id number] Date of Birth: 1966-08-01  [facility] CM/SW Contact:  Natasha Bence, LCSW Phone Number: 04/16/2020, 1:16 PM   Clinical Narrative:    CSW received SA consult for patient. Patient reported that they were already in a class for SA. Patient declined SA referral list provided by CSW. TOC signing off.                         Social Determinants of Health (SDOH) Interventions     Readmission Risk Interventions No flowsheet data found.

## 2020-04-16 NOTE — Evaluation (Addendum)
Physical Therapy Evaluation Patient Details Name: Kirsten Walls MRN: [id number] DOB: [birth year] Today's Date: 04/16/2020   History of Present Illness  Kirsten Walls is a 54 y.o. female with medical history significant for hypertension, recurrent epistaxis, seizure disorder, tobacco abuse, alcohol abuse, and GERD who was brought to the ED via EMS as she was noted to be unresponsive.  EMS noted that patient had significant epistaxis and she was able to be awakened and ambulated to the stretcher.  In route, patient had an acute mental status change and became less responsive and was noted to have flaccidity of her left side.  She remained minimally responsive in route, but returned to her baseline mental status shortly after arrival to the ED.  There was no sign of any tremors or seizure activity, urinary incontinence, or tongue biting noted.  She does not appear to have any significant extremity motor strength or sensory deficits or speech deficit noted    Clinical Impression  The patient had a minor headache at the beginning of the evaluation today, which did not increase in severity with walking. The patient demonstrated independence with supine to sit transfer, sit to stand transfer, and ambulation. The patient received education on the importance of walking during her stay in the hospital. Patient tolerated sitting up in chair with the call bell within arm's reach after therapy. Patient discharged from physical therapy to care of nursing for ambulation daily as tolerated for the length of stay.     Follow Up Recommendations No PT follow up    Equipment Recommendations  None recommended by PT    Recommendations for Other Services       Precautions / Restrictions Precautions Precautions: None;Fall Precaution Comments: has passed out multiple times in hospital, however did not display s/s of syncope episode during PT today Restrictions Weight Bearing Restrictions: No      Mobility   Bed Mobility Overal bed mobility: Independent                Transfers Overall transfer level: Independent Equipment used: None                Ambulation/Gait Ambulation/Gait assistance: Independent Gait Distance (Feet): 70 Feet Assistive device: None Gait Pattern/deviations: WFL(Within Functional Limits);Step-through pattern   Gait velocity interpretation: >2.62 ft/sec, indicative of community ambulatory    Stairs            Wheelchair Mobility    Modified Rankin (Stroke Patients Only)       Balance Overall balance assessment: Independent                                           Pertinent Vitals/Pain Pain Assessment: No/denies pain    Home Living Family/patient expects to be discharged to:: Private residence Living Arrangements: Alone Available Help at Discharge: Friend(s);Available PRN/intermittently Type of Home: Apartment Home Access: Stairs to enter Entrance Stairs-Rails: None Entrance Stairs-Number of Steps: 1 Home Layout: One level Home Equipment: Tub bench;Grab bars - tub/shower      Prior Function Level of Independence: Independent         Comments: does not drive, does shopping independently     Hand Dominance   Dominant Hand: Right    Extremity/Trunk Assessment   Upper Extremity Assessment Upper Extremity Assessment: Overall WFL for tasks assessed    Lower Extremity Assessment Lower Extremity Assessment: Overall WFL for tasks assessed  Cervical / Trunk Assessment Cervical / Trunk Assessment: Normal  Communication   Communication: No difficulties  Cognition Arousal/Alertness: Awake/alert Behavior During Therapy: WFL for tasks assessed/performed Overall Cognitive Status: Within Functional Limits for tasks assessed                                        General Comments      Exercises     Assessment/Plan D/c to nursing for ambulation   PT Assessment Patent does not  need any further PT services  PT Problem List         PT Treatment Interventions      PT Goals (Current goals can be found in the Care Plan section)  Acute Rehab PT Goals Patient Stated Goal: go home PT Goal Formulation: With patient Time For Goal Achievement: 04/19/20 Potential to Achieve Goals: Good    Frequency     Barriers to discharge        Co-evaluation               AM-PAC PT "6 Clicks" Mobility  Outcome Measure Help needed turning from your back to your side while in a flat bed without using bedrails?: None Help needed moving from lying on your back to sitting on the side of a flat bed without using bedrails?: None Help needed moving to and from a bed to a chair (including a wheelchair)?: None Help needed standing up from a chair using your arms (e.g., wheelchair or bedside chair)?: None Help needed to walk in hospital room?: None Help needed climbing 3-5 steps with a railing? : None 6 Click Score: 24    End of Session   Activity Tolerance: Patient tolerated treatment well Patient left: in chair;with call bell/phone within reach Nurse Communication: Mobility status;Weight bearing status PT Visit Diagnosis: Other (comment);Other abnormalities of gait and mobility (R26.89);Unsteadiness on feet (R26.81) (syncope episodes)    Time: 0812-0830 PT Time Calculation (min) (ACUTE ONLY): 18 min   Charges:   PT Evaluation $PT Eval Low Complexity: 1 Low PT Treatments $Therapeutic Activity: 8-22 mins        10:00 AM , 04/16/20 Karlyn Agee, SPT Physical Therapy with [facility] [phone] office  During this treatment session, the therapist was present, participating in and directing the treatment.  10:00 AM, 04/16/20 Lonell Grandchild, MPT Physical Therapist with [facility] [phone] office [phone] mobile phone

## 2020-04-16 NOTE — Discharge Summary (Addendum)
Physician Discharge Summary  Quantasia Stegner JJO:[id number] DOB: May 22, 1966 DOA: 04/15/2020  PCP: Rosita Fire, MD  Admit date: 04/15/2020  Discharge date: 04/16/2020  Admitted From:Home  Disposition:  Home  Recommendations for Outpatient Follow-up:  1. Follow up with PCP in 1-2 weeks 2. Follow-up with ENT Dr. Redmond Baseman in 5 days for removal of Rhino Rocket 3. Patient counseled on cessation of cocaine use which is likely contributing with epistaxes that has been recurrent 4. Patient advised to start back on amlodipine for better blood pressure control and prescription has been provided 5. Counseled on cessation of alcohol and tobacco abuse 6. Continue other home medications as provided and return to ED should epistaxis worsen  Home Health: None  Equipment/Devices: None  Discharge Condition: Stable  CODE STATUS: Full  Diet recommendation: Heart Healthy  Brief/Interim Summary: Per HPI: Pami Wool is a 54 y.o. female with medical history significant for hypertension, recurrent epistaxis, seizure disorder, tobacco abuse, alcohol abuse, and GERD who was brought to the ED via EMS as she was noted to be unresponsive.  EMS noted that patient had significant epistaxis and she was able to be awakened and ambulated to the stretcher.  In route, patient had an acute mental status change and became less responsive and was noted to have flaccidity of her left side.  She remained minimally responsive in route, but returned to her baseline mental status shortly after arrival to the ED.  There was no sign of any tremors or seizure activity, urinary incontinence, or tongue biting noted.  She does not appear to have any significant extremity motor strength or sensory deficits or speech deficit noted.  Notably, she was recently discharged on 03/24/2020 with significant epistaxis at that time that required 2 units of PRBCs.  She is thought to have a recurrence of her bleeding on account of poorly  controlled blood pressure at home.  -Patient was admitted with recurrent left epistaxis in the setting of cocaine use.  She is also noted to have a syncopal episode in route to the hospital and was quite altered, but this was attributed to her intoxication with alcohol and she was also noted to be intoxicated with cocaine.  Initially, further studies were going to be performed to evaluate the syncope, but once toxicology had returned 2D echocardiogram was canceled.  No further findings on telemetry throughout the course of this admission.  Carotid ultrasound with no significant findings noted either.  CT head and MRI were performed due to concern for possible TIA with no acute findings noted either.    Blood pressures have remained somewhat elevated throughout the course of this admission and patient has been recommended to refrain from cocaine use and start amlodipine as prescribed.  Patient is stable for discharge and referral has been placed to ENT to follow-up in the next 5 days for removal of Rhino Rocket.  No other acute events noted throughout the course of this admission.  She is stable for discharge.   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  Discharge Instructions  Discharge Instructions    Ambulatory referral to ENT   Complete by: As directed    Needs to be seen in 5 days for removal of rhino rocket to L nare.   Diet - low sodium heart healthy   Complete by: As directed    Increase activity slowly   Complete by: As directed      Allergies as  of 04/16/2020      Reactions   Penicillins Hives   Did it involve swelling of the face/tongue/throat, SOB, or low BP? Yes Did it involve sudden or severe rash/hives, skin peeling, or any reaction on the inside of your mouth or nose? No Did you need to seek medical attention at a hospital or doctor's office? Yes When did it last happen?Over 10 years  ago-childhood If all above answers are "NO", may proceed with cephalosporin use.   Azithromycin Rash   Sulfamethoxazole-trimethoprim Hives, Rash      Medication List    STOP taking these medications   cephALEXin 500 MG capsule Commonly known as: KEFLEX     TAKE these medications   amLODipine 5 MG tablet Commonly known as: NORVASC Take 1 tablet (5 mg total) by mouth daily. Start taking on: April 17, 2020   baclofen 10 MG tablet Commonly known as: LIORESAL Take 10 mg by mouth 2 (two) times daily as needed for muscle spasms.   diphenhydramine-acetaminophen 25-500 MG Tabs tablet Commonly known as: TYLENOL PM Take 1 tablet by mouth at bedtime as needed (sleep, allergies).   docusate sodium 100 MG capsule Commonly known as: COLACE Take 100 mg by mouth daily as needed for mild constipation.   EPINEPHrine 0.3 mg/0.3 mL Soaj injection Commonly known as: EpiPen 2-Pak Inject 0.3 mLs (0.3 mg total) into the muscle as needed for anaphylaxis.   esomeprazole 40 MG capsule Commonly known as: NEXIUM Take 40 mg by mouth daily.   famotidine 20 MG tablet Commonly known as: PEPCID Take 1 tablet (20 mg total) by mouth 2 (two) times daily.   MULTIVITAMIN PO Take 1 tablet by mouth daily.   oxymetazoline 0.05 % nasal spray Commonly known as: AFRIN Place 1 spray into both nostrils 2 (two) times daily as needed for congestion.   topiramate 25 MG tablet Commonly known as: TOPAMAX Take 100 mg by mouth daily.   Ventolin HFA 108 (90 Base) MCG/ACT inhaler Generic drug: albuterol Inhale 2 puffs into the lungs every 6 (six) hours as needed. For shortness of breath/asthma/bronchitis   Vitamin D3 125 MCG (5000 UT) Caps Take 5,000 Units by mouth daily.       Follow-up Information    Rosita Fire, MD Follow up in 1 week(s).   Specialty: Internal Medicine Contact information: [place] [address] [phone]        Melida Quitter, MD Follow up in 5 day(s).    Specialty: Otolaryngology Why: Removal of rhino rocket. Epistaxis. Contact information: [address] [place] [address] [phone]              Allergies  Allergen Reactions  . Penicillins Hives    Did it involve swelling of the face/tongue/throat, SOB, or low BP? Yes Did it involve sudden or severe rash/hives, skin peeling, or any reaction on the inside of your mouth or nose? No Did you need to seek medical attention at a hospital or doctor's office? Yes When did it last happen?Over 10 years ago-childhood If all above answers are "NO", may proceed with cephalosporin use.   . Azithromycin Rash  . Sulfamethoxazole-Trimethoprim Hives and Rash    Consultations:  Discussed with ENT Dr. Marcelline Deist   Procedures/Studies: CT Head Wo Contrast  Result Date: 04/15/2020 CLINICAL DATA:  Mental status change, unknown cause. Additional provided: Headache, nose bleed, decreased responsiveness EXAM: CT HEAD WITHOUT CONTRAST TECHNIQUE: Contiguous axial images were obtained from the base of the skull through  the vertex without intravenous contrast. COMPARISON:  Brain MRI [id number], head CT 10/09/2011. FINDINGS: Brain: Cerebral volume is normal for age. There is no acute intracranial hemorrhage. No demarcated cortical infarct. No extra-axial fluid collection. No evidence of intracranial mass. No midline shift. Vascular: No hyperdense vessel.  Atherosclerotic calcifications Skull: Normal. Negative for fracture or focal lesion. Sinuses/Orbits: Visualized orbits show no acute finding. Moderate mucosal thickening and secretions within the visualized ethmoid and maxillary sinuses. No significant mastoid effusion. Soft tissue prominence and/or fluid within the partially imaged left nasal passage. IMPRESSION: Unremarkable non-contrast CT appearance of the brain. No evidence of acute intracranial abnormality Moderate mucosal thickening and secretions within the visualized ethmoid and  maxillary sinuses. Correlate for acute sinusitis. Soft tissue prominence and/or fluid within the partially imaged left nasal passage. Direct visualization recommended. Electronically Signed   By: Kellie Simmering DO   On: 04/15/2020 13:33   MR BRAIN WO CONTRAST  Result Date: 04/15/2020 CLINICAL DATA:  53 year old female with altered mental status. Headache, epistaxis. EXAM: MRI HEAD WITHOUT CONTRAST TECHNIQUE: Multiplanar, multiecho pulse sequences of the brain and surrounding structures were obtained without intravenous contrast. COMPARISON:  Head CT earlier today.  Brain MRI 04/08/2012. FINDINGS: Brain: Cerebral volume remains within normal limits. No restricted diffusion to suggest acute infarction. No midline shift, mass effect, evidence of mass lesion, ventriculomegaly, extra-axial collection or acute intracranial hemorrhage. Cervicomedullary junction and pituitary are within normal limits. Pearline Cables and white matter signal is within normal limits for age throughout the brain. No cortical encephalomalacia or chronic cerebral blood products identified. Vascular: Major intracranial vascular flow voids are stable since 2013. Skull and upper cervical spine: Negative visible cervical spine. Visualized bone marrow signal is within normal limits. Sinuses/Orbits: Negative orbits. Small fluid levels in both maxillary sinuses, and fluid in the left nasal cavity and nasopharynx (series 10, image 3) which might be epistaxis related. Other: Mastoids are clear. Scalp and face soft tissues appear negative. IMPRESSION: 1. No acute intracranial abnormality. Normal for age noncontrast MRI appearance of the brain. 2. Some fluid in the left nasal cavity and nasopharynx which might be epistaxis related. Electronically Signed   By: Genevie Ann M.D.   On: 04/15/2020 16:41   US Carotid Bilateral  Result Date: 04/15/2020 CLINICAL DATA:  Syncope, hypertension, stroke symptoms EXAM: BILATERAL CAROTID DUPLEX ULTRASOUND TECHNIQUE: Pearline Cables scale  imaging, color Doppler and duplex ultrasound were performed of bilateral carotid and vertebral arteries in the neck. COMPARISON:  None. FINDINGS: Criteria: Quantification of carotid stenosis is based on velocity parameters that correlate the residual internal carotid diameter with NASCET-based stenosis levels, using the diameter of the distal internal carotid lumen as the denominator for stenosis measurement. The following velocity measurements were obtained: RIGHT ICA: 116/46 cm/sec CCA: 00/86 cm/sec SYSTOLIC ICA/CCA RATIO:  1.5 ECA: 70 cm/sec LEFT ICA: 72/33 cm/sec CCA: 76/19 cm/sec SYSTOLIC ICA/CCA RATIO:  1.0 ECA: 57 cm/sec RIGHT CAROTID ARTERY: No significant atherosclerotic change. No hemodynamically significant stenosis, velocity elevation, turbulent flow. Negative for stenosis. RIGHT VERTEBRAL ARTERY:  Normal antegrade flow LEFT CAROTID ARTERY: Very minimal hypoechoic bifurcation atherosclerotic change. No hemodynamically significant left ICA stenosis, velocity elevation, turbulent flow. Degree of narrowing less than 50% by ultrasound criteria. LEFT VERTEBRAL ARTERY:  Normal antegrade flow IMPRESSION: Minor carotid atherosclerosis. No hemodynamically significant ICA stenosis. Degree of narrowing less than 50% bilaterally by ultrasound criteria. Patent antegrade vertebral flow bilaterally Electronically Signed   By: Jerilynn Mages.  Shick M.D.   On: 04/15/2020 16:28     Discharge Exam: Vitals:  04/16/20 0809 04/16/20 0900  BP: (!) 166/98   Pulse: 71 81  Resp: 19 20  Temp:    SpO2: 100% 100%   Vitals:   04/16/20 0500 04/16/20 0800 04/16/20 0809 04/16/20 0900  BP:   (!) 166/98   Pulse: 73 82 71 81  Resp: 14 (!) 28 19 20   Temp:      TempSrc:      SpO2: 99% 100% 100% 100%  Weight:      Height:        General: Pt is alert, awake, not in acute distress, Rhino Rocket to left nare Cardiovascular: RRR, S1/S2 +, no rubs, no gallops Respiratory: CTA bilaterally, no wheezing, no rhonchi Abdominal: Soft,  NT, ND, bowel sounds + Extremities: no edema, no cyanosis    The results of significant diagnostics from this hospitalization (including imaging, microbiology, ancillary and laboratory) are listed below for reference.     Microbiology: Recent Results (from the past 240 hour(s))  SARS Coronavirus 2 by RT PCR (hospital order, performed in [facility] hospital lab) Nasopharyngeal Nasopharyngeal Swab     Status: None   Collection Time: 04/15/20  2:10 PM   Specimen: Nasopharyngeal Swab  Result Value Ref Range Status   SARS Coronavirus 2 NEGATIVE NEGATIVE Final    Comment: (NOTE) SARS-CoV-2 target nucleic acids are NOT DETECTED.  The SARS-CoV-2 RNA is generally detectable in upper and lower respiratory specimens during the acute phase of infection. The lowest concentration of SARS-CoV-2 viral copies this assay can detect is 250 copies / mL. A negative result does not preclude SARS-CoV-2 infection and should not be used as the sole basis for treatment or other patient management decisions.  A negative result may occur with improper specimen collection / handling, submission of specimen other than nasopharyngeal swab, presence of viral mutation(s) within the areas targeted by this assay, and inadequate number of viral copies (<250 copies / mL). A negative result must be combined with clinical observations, patient history, and epidemiological information.  Fact Sheet for Patients:   StrictlyIdeas.no  Fact Sheet for Healthcare Providers: BankingDealers.co.za  This test is not yet approved or  cleared by the [place] FDA and has been authorized for detection and/or diagnosis of SARS-CoV-2 by FDA under an Emergency Use Authorization (EUA).  This EUA will remain in effect (meaning this test can be used) for the duration of the COVID-19 declaration under Section 564(b)(1) of the Act, 21 U.S.C. section 360bbb-3(b)(1), unless the  authorization is terminated or revoked sooner.  Performed at [facility], [address] [place], [place] [address]   MRSA PCR Screening     Status: None   Collection Time: 04/15/20  9:50 PM   Specimen: Nasal Mucosa; Nasopharyngeal  Result Value Ref Range Status   MRSA by PCR NEGATIVE NEGATIVE Final    Comment:        The GeneXpert MRSA Assay (FDA approved for NASAL specimens only), is one component of a comprehensive MRSA colonization surveillance program. It is not intended to diagnose MRSA infection nor to guide or monitor treatment for MRSA infections. Performed at [facility], [address] [place], [place] [address]      Labs: BNP (last 3 results) No results for input(s): BNP in the last 8760 hours. Basic Metabolic Panel: Recent Labs  Lab 04/12/20 1000 04/15/20 1141 04/16/20 0347  NA 139 139 137  K 3.8 3.4* 4.0  CL 107 108 109  CO2 23 19* 20*  GLUCOSE 110* 93 109*  BUN  14 12 17   CREATININE 0.86 0.93 1.02*  CALCIUM 9.6 9.5 9.4  MG  --   --  2.0   Liver Function Tests: Recent Labs  Lab 04/15/20 1141  AST 22  ALT 20  ALKPHOS 106  BILITOT 0.4  PROT 8.4*  ALBUMIN 4.9   No results for input(s): LIPASE, AMYLASE in the last 168 hours. No results for input(s): AMMONIA in the last 168 hours. CBC: Recent Labs  Lab 04/12/20 1000 04/15/20 1141 04/16/20 0347 04/16/20 0904  WBC 5.1 11.4* 8.1  --   NEUTROABS 2.8 7.8*  --   --   HGB 10.9* 10.2* 8.5* 9.8*  HCT 35.0* 32.2* 27.1* 31.5*  MCV 96.2 93.1 94.4  --   PLT 469* 402* 341  --    Cardiac Enzymes: No results for input(s): CKTOTAL, CKMB, CKMBINDEX, TROPONINI in the last 168 hours. BNP: Invalid input(s): POCBNP CBG: Recent Labs  Lab 04/15/20 1229  GLUCAP 102*   D-Dimer No results for input(s): DDIMER in the last 72 hours. Hgb A1c No results for input(s): HGBA1C in the last 72 hours. Lipid Profile No results for input(s): CHOL, HDL, LDLCALC, TRIG, CHOLHDL, LDLDIRECT in the last 72  hours. Thyroid function studies No results for input(s): TSH, T4TOTAL, T3FREE, THYROIDAB in the last 72 hours.  Invalid input(s): FREET3 Anemia work up No results for input(s): VITAMINB12, FOLATE, FERRITIN, TIBC, IRON, RETICCTPCT in the last 72 hours. Urinalysis    Component Value Date/Time   COLORURINE YELLOW 03/24/2010 1650   APPEARANCEUR CLEAR 03/24/2010 1650   LABSPEC 1.020 03/24/2010 1650   PHURINE 6.0 03/24/2010 1650   GLUCOSEU NEGATIVE 03/24/2010 1650   HGBUR NEGATIVE 03/24/2010 1650   BILIRUBINUR NEGATIVE 03/24/2010 1650   KETONESUR TRACE (A) 03/24/2010 1650   PROTEINUR NEGATIVE 03/24/2010 1650   UROBILINOGEN 0.2 03/24/2010 1650   NITRITE NEGATIVE 03/24/2010 1650   LEUKOCYTESUR  03/24/2010 1650    NEGATIVE MICROSCOPIC NOT DONE ON URINES WITH NEGATIVE PROTEIN, BLOOD, LEUKOCYTES, NITRITE, OR GLUCOSE <1000 mg/dL.   Sepsis Labs Invalid input(s): PROCALCITONIN,  WBC,  LACTICIDVEN Microbiology Recent Results (from the past 240 hour(s))  SARS Coronavirus 2 by RT PCR (hospital order, performed in [facility] hospital lab) Nasopharyngeal Nasopharyngeal Swab     Status: None   Collection Time: 04/15/20  2:10 PM   Specimen: Nasopharyngeal Swab  Result Value Ref Range Status   SARS Coronavirus 2 NEGATIVE NEGATIVE Final    Comment: (NOTE) SARS-CoV-2 target nucleic acids are NOT DETECTED.  The SARS-CoV-2 RNA is generally detectable in upper and lower respiratory specimens during the acute phase of infection. The lowest concentration of SARS-CoV-2 viral copies this assay can detect is 250 copies / mL. A negative result does not preclude SARS-CoV-2 infection and should not be used as the sole basis for treatment or other patient management decisions.  A negative result may occur with improper specimen collection / handling, submission of specimen other than nasopharyngeal swab, presence of viral mutation(s) within the areas targeted by this assay, and inadequate number of viral  copies (<250 copies / mL). A negative result must be combined with clinical observations, patient history, and epidemiological information.  Fact Sheet for Patients:   StrictlyIdeas.no  Fact Sheet for Healthcare Providers: BankingDealers.co.za  This test is not yet approved or  cleared by the [place] FDA and has been authorized for detection and/or diagnosis of SARS-CoV-2 by FDA under an Emergency Use Authorization (EUA).  This EUA will remain in effect (meaning this test can be  used) for the duration of the COVID-19 declaration under Section 564(b)(1) of the Act, 21 U.S.C. section 360bbb-3(b)(1), unless the authorization is terminated or revoked sooner.  Performed at [facility], [address], [place], [place] [address]   MRSA PCR Screening     Status: None   Collection Time: 04/15/20  9:50 PM   Specimen: Nasal Mucosa; Nasopharyngeal  Result Value Ref Range Status   MRSA by PCR NEGATIVE NEGATIVE Final    Comment:        The GeneXpert MRSA Assay (FDA approved for NASAL specimens only), is one component of a comprehensive MRSA colonization surveillance program. It is not intended to diagnose MRSA infection nor to guide or monitor treatment for MRSA infections. Performed at [facility], [address] [place], [place] [address]      Time coordinating discharge: 35 minutes  SIGNED:   Rodena Goldmann, DO [facility] 04/16/2020, 12:21 PM  If 7PM-7AM, please contact night-coverage [email or web address]

## 2020-06-07 ENCOUNTER — Other Ambulatory Visit: Payer: Medicare Other

## 2020-06-07 ENCOUNTER — Other Ambulatory Visit: Payer: Self-pay

## 2020-06-07 DIAGNOSIS — Z20822 Contact with and (suspected) exposure to covid-19: Secondary | ICD-10-CM

## 2020-06-09 LAB — SPECIMEN STATUS REPORT

## 2020-06-09 LAB — SARS-COV-2, NAA 2 DAY TAT

## 2020-06-09 LAB — NOVEL CORONAVIRUS, NAA: SARS-CoV-2, NAA: NOT DETECTED

## 2020-06-10 ENCOUNTER — Telehealth: Payer: Self-pay | Admitting: Internal Medicine

## 2020-06-10 NOTE — Telephone Encounter (Signed)
Negative COVID results given. Patient results "NOT Detected." Caller expressed understanding. ° °

## 2020-08-10 ENCOUNTER — Other Ambulatory Visit (HOSPITAL_COMMUNITY)
Admission: RE | Admit: 2020-08-10 | Discharge: 2020-08-10 | Disposition: A | Discharge status: Home / Self Care | Payer: Medicare Other | Source: Ambulatory Visit | Attending: Internal Medicine | Admitting: Internal Medicine

## 2020-08-10 ENCOUNTER — Other Ambulatory Visit: Payer: Self-pay

## 2020-08-10 DIAGNOSIS — Z0001 Encounter for general adult medical examination with abnormal findings: Secondary | ICD-10-CM | POA: Diagnosis present

## 2020-08-10 DIAGNOSIS — I10 Essential (primary) hypertension: Secondary | ICD-10-CM | POA: Insufficient documentation

## 2020-08-10 DIAGNOSIS — Z79899 Other long term (current) drug therapy: Secondary | ICD-10-CM | POA: Diagnosis present

## 2020-08-10 LAB — HEPATIC FUNCTION PANEL
ALT: 24 U/L (ref 0–44)
AST: 21 U/L (ref 15–41)
Albumin: 3.9 g/dL (ref 3.5–5.0)
Alkaline Phosphatase: 72 U/L (ref 38–126)
Bilirubin, Direct: <0.1 mg/dL (ref 0.0–0.2)
Total Bilirubin: 0.4 mg/dL (ref 0.3–1.2)
Total Protein: 7.3 g/dL (ref 6.5–8.1)

## 2020-08-10 LAB — HEMOGLOBIN A1C
Hgb A1c MFr Bld: 5.9 % — ABNORMAL HIGH (ref 4.8–5.6)
Mean Plasma Glucose: 122.63 mg/dL

## 2020-08-10 LAB — BASIC METABOLIC PANEL
Anion gap: 8 (ref 5–15)
BUN: 10 mg/dL (ref 6–20)
CO2: 26 mmol/L (ref 22–32)
Calcium: 9.7 mg/dL (ref 8.9–10.3)
Chloride: 106 mmol/L (ref 98–111)
Creatinine, Ser: 0.81 mg/dL (ref 0.44–1.00)
GFR, Estimated: >60 mL/min (ref >60)
Glucose, Bld: 97 mg/dL (ref 70–99)
Potassium: 4 mmol/L (ref 3.5–5.1)
Sodium: 140 mmol/L (ref 135–145)

## 2020-08-10 LAB — CBC WITH DIFFERENTIAL/PLATELET
Abs Immature Granulocytes: 0.03 10*3/uL (ref 0.00–0.07)
Basophils Absolute: 0.1 10*3/uL (ref 0.0–0.1)
Basophils Relative: 1 %
Eosinophils Absolute: 0.1 10*3/uL (ref 0.0–0.5)
Eosinophils Relative: 1 %
HCT: 36.2 % (ref 36.0–46.0)
Hemoglobin: 11.2 g/dL — ABNORMAL LOW (ref 12.0–15.0)
Immature Granulocytes: 0 %
Lymphocytes Relative: 30 %
Lymphs Abs: 2.1 10*3/uL (ref 0.7–4.0)
MCH: 25.6 pg — ABNORMAL LOW (ref 26.0–34.0)
MCHC: 30.9 g/dL (ref 30.0–36.0)
MCV: 82.8 fL (ref 80.0–100.0)
Monocytes Absolute: 0.5 10*3/uL (ref 0.1–1.0)
Monocytes Relative: 6 %
Neutro Abs: 4.4 10*3/uL (ref 1.7–7.7)
Neutrophils Relative %: 62 %
Platelets: 285 10*3/uL (ref 150–400)
RBC: 4.37 MIL/uL (ref 3.87–5.11)
RDW: 23 % — ABNORMAL HIGH (ref 11.5–15.5)
WBC: 7.2 10*3/uL (ref 4.0–10.5)
nRBC: 0 % (ref 0.0–0.2)

## 2020-08-10 LAB — LIPID PANEL
Cholesterol: 176 mg/dL (ref 0–200)
HDL: 82 mg/dL (ref >40)
LDL Cholesterol: 71 mg/dL (ref 0–99)
Total CHOL/HDL Ratio: 2.1 RATIO
Triglycerides: 117 mg/dL (ref <150)
VLDL: 23 mg/dL (ref 0–40)

## 2020-08-18 ENCOUNTER — Ambulatory Visit (INDEPENDENT_AMBULATORY_CARE_PROVIDER_SITE_OTHER): Payer: Medicare Other | Admitting: Podiatry

## 2020-08-18 ENCOUNTER — Ambulatory Visit (INDEPENDENT_AMBULATORY_CARE_PROVIDER_SITE_OTHER): Payer: Medicare Other

## 2020-08-18 ENCOUNTER — Other Ambulatory Visit: Payer: Self-pay

## 2020-08-18 ENCOUNTER — Encounter: Payer: Self-pay | Admitting: Podiatry

## 2020-08-18 DIAGNOSIS — M722 Plantar fascial fibromatosis: Secondary | ICD-10-CM

## 2020-08-18 MED ORDER — METHYLPREDNISOLONE 4 MG PO TBPK: ORAL_TABLET | ORAL | 0 refills | Status: DC

## 2020-08-18 MED ORDER — MELOXICAM 15 MG PO TABS: 15.0000 mg | ORAL_TABLET | Freq: Every day | ORAL | 1 refills | Status: DC

## 2020-08-18 MED ORDER — BETAMETHASONE SOD PHOS & ACET 6 (3-3) MG/ML IJ SUSP
3.0000 mg | Freq: Once | INTRAMUSCULAR | Status: AC
  Administered 2020-08-18: 3 mg via INTRAMUSCULAR

## 2020-08-18 NOTE — Progress Notes (Signed)
   Subjective: 54 y.o. female presenting as a reestablish new patient for evaluation of right heel pain is been going on for approximately 3 months now.  She is not done anything for treatment.  She denies injury.  She presents for further treatment evaluation   Past Medical History:  Diagnosis Date  . Arthritis   . Asthma   . Bronchitis   . Chronic shoulder pain   . Epistaxis   . Hypertension   . Insomnia   . Seizures ([place])      Objective: Physical Exam General: The patient is alert and oriented x3 in no acute distress.  Dermatology: Skin is warm, dry and supple bilateral lower extremities. Negative for open lesions or macerations bilateral.   Vascular: Dorsalis Pedis and Posterior Tibial pulses palpable bilateral.  Capillary fill time is immediate to all digits.  Neurological: Epicritic and protective threshold intact bilateral.   Musculoskeletal: Tenderness to palpation to the plantar aspect of the right heel along the plantar fascia. All other joints range of motion within normal limits bilateral. Strength 5/5 in all groups bilateral.   Radiographic exam: Normal osseous mineralization. Joint spaces preserved. No fracture/dislocation/boney destruction. No other soft tissue abnormalities or radiopaque foreign bodies.   Assessment: 1. Plantar fasciitis right  Plan of Care:  1. Patient evaluated. Xrays reviewed.   2. Injection of 0.5cc Celestone soluspan injected into the right plantar fascia  3. Rx for Medrol Dose Pack placed 4. Rx for Meloxicam ordered for patient. 5. Plantar fascial band(s) dispensed 6. Instructed patient regarding therapies and modalities at home to alleviate symptoms.  7. Return to clinic in 4 weeks.    *Not working.   Edrick Kins, DPM [facility]  Dr. Edrick Kins, DPM    [address] [place], [place] [address]                Office [phone]  Fax [phone]

## 2020-08-19 ENCOUNTER — Ambulatory Visit: Payer: Medicare Other

## 2020-08-26 ENCOUNTER — Ambulatory Visit: Payer: Medicare Other | Attending: Internal Medicine

## 2020-08-26 DIAGNOSIS — Z23 Encounter for immunization: Secondary | ICD-10-CM

## 2020-08-26 NOTE — Progress Notes (Signed)
   Covid-19 Vaccination Clinic  Name:  Kirsten Walls    MRN: [id number] DOB: [birth year]  08/26/2020  [name] was observed post Covid-19 immunization for 15 minutes without incident. She was provided with Vaccine Information Sheet and instruction to access the V-Safe system.   [name] was instructed to call 911 with any severe reactions post vaccine: Marland Kitchen Difficulty breathing  . Swelling of face and throat  . A fast heartbeat  . A bad rash all over body  . Dizziness and weakness   Immunizations Administered    Name Date Dose VIS Date [place] COVID-19 Vaccine 08/26/2020  2:05 PM 0.3 mL 07/07/2020 Intramuscular   Manufacturer: [employer]   Lot: [id number]   [place]: [id number]

## 2020-09-12 ENCOUNTER — Emergency Department (HOSPITAL_COMMUNITY)
Admission: EM | Admit: 2020-09-12 | Discharge: 2020-09-12 | Disposition: A | Discharge status: Home / Self Care | Payer: Medicare Other | Attending: Emergency Medicine | Admitting: Emergency Medicine

## 2020-09-12 ENCOUNTER — Other Ambulatory Visit: Payer: Self-pay

## 2020-09-12 ENCOUNTER — Encounter (HOSPITAL_COMMUNITY): Payer: Self-pay

## 2020-09-12 DIAGNOSIS — Z79899 Other long term (current) drug therapy: Secondary | ICD-10-CM | POA: Diagnosis not present

## 2020-09-12 DIAGNOSIS — I10 Essential (primary) hypertension: Secondary | ICD-10-CM | POA: Diagnosis not present

## 2020-09-12 DIAGNOSIS — A599 Trichomoniasis, unspecified: Secondary | ICD-10-CM | POA: Insufficient documentation

## 2020-09-12 DIAGNOSIS — F17210 Nicotine dependence, cigarettes, uncomplicated: Secondary | ICD-10-CM | POA: Diagnosis not present

## 2020-09-12 DIAGNOSIS — J45909 Unspecified asthma, uncomplicated: Secondary | ICD-10-CM | POA: Insufficient documentation

## 2020-09-12 DIAGNOSIS — L292 Pruritus vulvae: Secondary | ICD-10-CM | POA: Diagnosis present

## 2020-09-12 DIAGNOSIS — N898 Other specified noninflammatory disorders of vagina: Secondary | ICD-10-CM

## 2020-09-12 LAB — URINALYSIS, ROUTINE W REFLEX MICROSCOPIC
Bilirubin Urine: NEGATIVE
Glucose, UA: NEGATIVE mg/dL
Hgb urine dipstick: NEGATIVE
Ketones, ur: NEGATIVE mg/dL
Nitrite: NEGATIVE
Protein, ur: NEGATIVE mg/dL
Specific Gravity, Urine: 1.015 (ref 1.005–1.030)
pH: 7 (ref 5.0–8.0)

## 2020-09-12 LAB — WET PREP, GENITAL
Clue Cells Wet Prep HPF POC: NONE SEEN
Sperm: NONE SEEN
Yeast Wet Prep HPF POC: NONE SEEN

## 2020-09-12 MED ORDER — METRONIDAZOLE 500 MG PO TABS: 500.0000 mg | ORAL_TABLET | Freq: Two times a day (BID) | ORAL | 0 refills | Status: DC

## 2020-09-12 MED ORDER — DOXYCYCLINE HYCLATE 100 MG PO CAPS: 100.0000 mg | ORAL_CAPSULE | Freq: Two times a day (BID) | ORAL | 0 refills | Status: AC

## 2020-09-12 MED ORDER — GENTAMICIN SULFATE 40 MG/ML IJ SOLN
240.0000 mg | Freq: Once | INTRAMUSCULAR | Status: AC
  Administered 2020-09-12: 14:00:00 240 mg via INTRAMUSCULAR
  Filled 2020-09-12: qty 6

## 2020-09-12 NOTE — Discharge Instructions (Addendum)
Seen here for vaginal itchiness.  Labs shows that you have trichomonas, I have started you on antibiotics Flagyl and doxycycline.  Please do not drink alcohol while taking this medication as it can react badly with it.  I have treated for gonorrhea and chlamydia.  I recommend telling your sexual partners that you have trichomonas and they should be tested and treated.  I recommend abstain from sexual intercourse for the next 2 weeks to allow the infection go away.  I recommend following up with the health department as able screen and treat you for STDs.  Come back to the emergency department if you develop chest pain, shortness of breath, severe abdominal pain, uncontrolled nausea, vomiting, diarrhea.

## 2020-09-12 NOTE — ED Triage Notes (Signed)
Pt presents to ED with complaints of vaginal itching and burning x 3 days.

## 2020-09-12 NOTE — ED Provider Notes (Signed)
[facility] EMERGENCY DEPARTMENT Provider Note   CSN: [place]:[id number] Arrival date & time: 09/12/20  [id number]     History Chief Complaint  Patient presents with  . Vaginal Itching    Kirsten Walls is a 54 y.o. female.  HPI    Patient with no significant medical history presents to the emergency department with chief complaint of vaginal itchiness and dysuria.  Patient states this started yesterday and has progressly gotten worse.  She states every time she urinates she feels a burning sensation and has noticed some white vaginal discharge.  She denies pelvic pain, vaginal bleeding, increased urination, hematuria, flank pain, abdominal pain, nausea, vomiting, diarrhea.  She endorses that she had a total hysterectomy, and is sexually active, does not run last time she had an STD check.  She denies any alleviating factors.  Patient denies headaches, fevers, chills, shortness of breath, chest pain, abdominal pain, nausea, vomiting, diarrhea, pedal edema.  Past Medical History:  Diagnosis Date  . Arthritis   . Asthma   . Bronchitis   . Chronic shoulder pain   . Epistaxis   . Hypertension   . Insomnia   . Seizures [facility])     Patient Active Problem List   Diagnosis Date Noted  . Syncope 04/15/2020  . Leukocytosis 03/23/2020  . Acute blood loss anemia 03/23/2020  . GERD (gastroesophageal reflux disease) 03/23/2020  . Tobacco use 03/23/2020  . Epistaxis 08/26/2019  . Insomnia related to another mental disorder 07/30/2017  . Chronic insomnia 07/30/2017  . History of colonic polyps   . Diverticulosis of colon without hemorrhage   . Encounter for screening colonoscopy 07/27/2015  . Polypharmacy 07/27/2015  . Arthritis, shoulder region 04/27/2014  . Acute bursitis of right shoulder 02/16/2014  . Shoulder injury 02/16/2014  . 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 BUNION 03/17/2009  . H N P-LUMBAR 06/03/2008  . KNEE, ARTHRITIS, DEGEN./OSTEO 02/11/2008  . NEOPLASMS UNSPEC NATURE BONE  SOFT TISSUE&SKIN 02/03/2008  . LOW BACK PAIN 02/03/2008  . DERANGEMENT MENISCUS 01/23/2008  . JOINT EFFUSION, LEFT KNEE 01/23/2008  . TEAR MEDIAL MENISCUS 01/23/2008    Past Surgical History:  Procedure Laterality Date  . ABDOMINAL HYSTERECTOMY    . BUNIONECTOMY  both big toe  . CESAREAN SECTION  x2  . COLONOSCOPY N/A 08/18/2015   Procedure: COLONOSCOPY;  Surgeon: Daneil Dolin, MD;  Location: [facility];  Service: Endoscopy;  Laterality: N/A;  1000  . FOOT SURGERY    . KNEE SURGERY    . SHOULDER ARTHROSCOPY    . TUMOR REMOVAL  left knee cap     OB History    Gravida  4   Para  4   Term  4   Preterm      AB      Living  4     SAB      IAB      Ectopic      Multiple      Live Births              Family History  Problem Relation Age of Onset  . Hypertension Mother   . Hyperlipidemia Mother   . Diabetes Mother   . Asthma Other   . Colon cancer Neg Hx     Social History   Tobacco Use  . Smoking status: Heavy Tobacco Smoker    Packs/day: 0.50    Years: 30.00    Pack years: 15.00    Types: Cigarettes  . Smokeless tobacco:  Never Used  . Tobacco comment: social  Substance Use Topics  . Alcohol use: Yes    Comment: social  . Drug use: No    Home Medications Prior to Admission medications   Medication Sig Start Date End Date Taking? Authorizing Provider  albuterol (VENTOLIN HFA) 108 (90 BASE) MCG/ACT inhaler Inhale 2 puffs into the lungs every 6 (six) hours as needed. For shortness of breath/asthma/bronchitis     [provider]  amLODipine (NORVASC) 5 MG tablet Take 1 tablet (5 mg total) by mouth daily. 04/17/20 05/17/20  Manuella Ghazi, Pratik D, DO  baclofen (LIORESAL) 10 MG tablet Take 10 mg by mouth 2 (two) times daily as needed for muscle spasms.     [provider]  Cholecalciferol (VITAMIN D3) 125 MCG (5000 UT) CAPS Take 5,000 Units by mouth daily.    [provider]  clobetasol (TEMOVATE) 0.05 % external solution Apply  topically. 06/16/20   [provider]  diphenhydramine-acetaminophen (TYLENOL PM) 25-500 MG TABS tablet Take 1 tablet by mouth at bedtime as needed (sleep, allergies).    [provider]  docusate sodium (COLACE) 100 MG capsule Take 100 mg by mouth daily as needed for mild constipation.    [provider]  doxycycline (VIBRAMYCIN) 100 MG capsule Take 1 capsule (100 mg total) by mouth 2 (two) times daily for 7 days. 09/12/20 09/19/20  Marcello Fennel, PA-C  EPINEPHrine (EPIPEN 2-PAK) 0.3 mg/0.3 mL IJ SOAJ injection Inject 0.3 mLs (0.3 mg total) into the muscle as needed for anaphylaxis. 01/15/19   Fredia Sorrow, MD  esomeprazole (NEXIUM) 40 MG capsule Take 40 mg by mouth daily. 03/17/20   [provider]  famotidine (PEPCID) 20 MG tablet Take 1 tablet (20 mg total) by mouth 2 (two) times daily. Patient not taking: Reported on 03/23/2020 01/15/19   Fredia Sorrow, MD  IBU 800 MG tablet Take by mouth at bedtime. 06/30/20   [provider]  meloxicam (MOBIC) 15 MG tablet Take 1 tablet (15 mg total) by mouth daily. 08/18/20   Edrick Kins, DPM  methylPREDNISolone (MEDROL DOSEPAK) 4 MG TBPK tablet 6 day dose pack - take as directed 08/18/20   Edrick Kins, DPM  metroNIDAZOLE (FLAGYL) 500 MG tablet Take 1 tablet (500 mg total) by mouth 2 (two) times daily. 09/12/20   Marcello Fennel, PA-C  Multiple Vitamin (MULTIVITAMIN PO) Take 1 tablet by mouth daily.    [provider]  oxymetazoline (AFRIN) 0.05 % nasal spray Place 1 spray into both nostrils 2 (two) times daily as needed for congestion.    [provider]  pantoprazole (PROTONIX) 40 MG tablet Take 40 mg by mouth 2 (two) times daily. 07/21/20   [provider]  topiramate (TOPAMAX) 25 MG tablet Take 100 mg by mouth daily.    [provider]  Vitamin D, Ergocalciferol, (DRISDOL) 1.25 MG (50000 UNIT) CAPS capsule Take by mouth. 06/18/20   [provider]     Allergies    Penicillins, Azithromycin, and Sulfamethoxazole-trimethoprim  Review of Systems   Review of Systems  Constitutional: Negative for chills and fever.  HENT: Negative for congestion.   Respiratory: Negative for shortness of breath.   Cardiovascular: Negative for chest pain.  Gastrointestinal: Negative for abdominal pain.  Genitourinary: Positive for dysuria and vaginal discharge. Negative for difficulty urinating, dyspareunia, enuresis, flank pain, vaginal bleeding and vaginal pain.  Musculoskeletal: Negative for back pain.  Skin: Negative for rash.  Neurological: Negative for headaches.  Hematological:  Does not bruise/bleed easily.    Physical Exam Updated Vital Signs BP (!) 142/91 (BP Location: Right Arm)   Pulse (!) 58   Temp 98.4 F (36.9 C) (Oral)   Resp 16   Ht 5\' 4"  (1.626 m)   Wt 70.2 kg   SpO2 100%   BMI 26.55 kg/m   Physical Exam Vitals and nursing note reviewed. Exam conducted with a chaperone present.  Constitutional:      General: She is not in acute distress.    Appearance: Normal appearance. She is not ill-appearing.  HENT:     Head: Normocephalic and atraumatic.     Nose: No congestion or rhinorrhea.  Eyes:     Conjunctiva/sclera: Conjunctivae normal.  Cardiovascular:     Rate and Rhythm: Normal rate and regular rhythm.     Heart sounds: No murmur heard. No friction rub. No gallop.   Pulmonary:     Effort: Pulmonary effort is normal. No respiratory distress.     Breath sounds: No stridor. No wheezing, rhonchi or rales.  Abdominal:     General: There is no distension.     Palpations: Abdomen is soft.     Tenderness: There is no abdominal tenderness. There is no right CVA tenderness, left CVA tenderness or guarding.  Genitourinary:    General: Normal vulva.     Vagina: No vaginal discharge.     Comments: Pelvic exam performed, exterior genitalia was examined there was no lesions, rashes or discharge noted.  Vaginal canal was patent,  pink, no lesions or trauma noted.  Cervix was visualized there was no lesions, discharge, or other abnormalities noted. Musculoskeletal:     Right lower leg: No edema.     Left lower leg: No edema.  Skin:    General: Skin is warm and dry.  Neurological:     Mental Status: She is alert.  Psychiatric:        Mood and Affect: Mood normal.     ED Results / Procedures / Treatments   Labs (all labs ordered are listed, but only abnormal results are displayed) Labs Reviewed  WET PREP, GENITAL - Abnormal; Notable for the following components:      Result Value   Trich, Wet Prep MODERATE (*)    WBC, Wet Prep HPF POC FEW (*)    All other components within normal limits  URINALYSIS, ROUTINE W REFLEX MICROSCOPIC - Abnormal; Notable for the following components:   Color, Urine STRAW (*)    Leukocytes,Ua TRACE (*)    Bacteria, UA FEW (*)    All other components within normal limits  GC/CHLAMYDIA PROBE AMP ([place]) NOT AT [facility]    EKG None  Radiology No results found.  Procedures Pelvic exam  Date/Time: 09/12/2020 3:33 PM Performed by: Marcello Fennel, PA-C Authorized by: Marcello Fennel, PA-C  Consent: Verbal consent obtained. Risks and benefits: risks, benefits and alternatives were discussed Consent given by: patient Preparation: Patient was prepped and draped in the usual sterile fashion. Patient tolerance: patient tolerated the procedure well with no immediate complications    (including critical care time)  Medications Ordered in ED Medications  gentamicin (GARAMYCIN) injection 240 mg (240 mg Intramuscular Given 09/12/20 1354)    ED Course  I have reviewed the triage vital signs and the nursing notes.  Pertinent labs & imaging results that were available during my care of the patient were reviewed by me and considered in my medical decision making (see chart for details).  MDM Rules/Calculators/A&P                          Patient presents with  vaginal itchiness and dysuria.  She was alert, does not appear to be in acute distress, vital signs reassuring.  Due to complaint will recommend STD check, wet prep, pelvic exam for further evaluation.  Will obtain UA.  Patient was receptive to pelvic exam, no gross abnormalities noted, patient tolerated procedure well.  UA negative for nitrates, shows trace leukocytes, no red or white blood cells, few bacteria.  Wet prep shows moderate trichomonas, no clue cells, few white blood cells.  Gonorrhea chlamydia tests are pending at this time.  Low suspicion for systemic infection as patient is nontoxic-appearing, vital signs reassuring, no obvious source infection on my exam.  Low suspicion for UTI or pyelonephritis,  UA is unremarkable, no CVA tenderness noted on my exam.  Low suspicion for PID as patient was nontender during pelvic exam, no vaginal discharge noted.  I suspect patient's symptoms are secondary to trichomonas infection.  due to her sexual activity  will treat her prophylactically for STDs provide her with gentamicin as she is allergic to penicillins and provide her with Flagyl and doxy for further treatment.  Will recommend she follows up with health department for future STD checks and treatment.  Vital signs have remained stable, no indication for hospital admission. Patient given at home care as well strict return precautions.  Patient verbalized that they understood agreed to said plan.  Final Clinical Impression(s) / ED Diagnoses Final diagnoses:  Vaginal itching  Trichomonas infection    Rx / DC Orders ED Discharge Orders         Ordered    doxycycline (VIBRAMYCIN) 100 MG capsule  2 times daily        09/12/20 1349    metroNIDAZOLE (FLAGYL) 500 MG tablet  2 times daily        09/12/20 1349           Aron Baba 09/12/20 1535    Fredia Sorrow, MD 09/25/20 2356

## 2020-09-12 NOTE — ED Notes (Signed)
Pt is very angry re: wait   Reports 4 day hx of vag bleeding and burning   Reports she spoke w nurse who encouraged her to come to ED several days ago   Has taken no meds for her pain of 9/10  She is verbally abusive and reports she has urinated because noone called her back and she will urinate when she urinates and we will have to wait for her  Ambulates erect and is in NAD

## 2020-09-13 LAB — GC/CHLAMYDIA PROBE AMP (~~LOC~~) NOT AT ARMC
Chlamydia: NEGATIVE
Comment: NEGATIVE
Comment: NORMAL
Neisseria Gonorrhea: NEGATIVE

## 2020-09-15 ENCOUNTER — Ambulatory Visit: Payer: Medicare Other | Admitting: Podiatry

## 2020-09-17 ENCOUNTER — Ambulatory Visit
Admission: EM | Admit: 2020-09-17 | Discharge: 2020-09-17 | Disposition: A | Discharge status: Home / Self Care | Payer: Medicare Other | Attending: Family Medicine | Admitting: Family Medicine

## 2020-09-17 ENCOUNTER — Other Ambulatory Visit: Payer: Self-pay

## 2020-09-17 DIAGNOSIS — Z1152 Encounter for screening for COVID-19: Secondary | ICD-10-CM

## 2020-09-21 LAB — NOVEL CORONAVIRUS, NAA: SARS-CoV-2, NAA: NOT DETECTED

## 2020-09-22 ENCOUNTER — Encounter: Payer: Self-pay | Admitting: Podiatry

## 2020-09-22 ENCOUNTER — Ambulatory Visit (INDEPENDENT_AMBULATORY_CARE_PROVIDER_SITE_OTHER): Payer: Medicare Other | Admitting: Podiatry

## 2020-09-22 ENCOUNTER — Other Ambulatory Visit: Payer: Self-pay

## 2020-09-22 DIAGNOSIS — M722 Plantar fascial fibromatosis: Secondary | ICD-10-CM | POA: Diagnosis not present

## 2020-09-22 NOTE — Progress Notes (Signed)
   HPI: 55 y.o. female presenting today for follow-up evaluation of plantar fasciitis to the right foot.  Patient states she is doing very well.  No new complaints at this time.  She is wearing good supportive tennis shoes with minimal pain.  Past Medical History:  Diagnosis Date  . Arthritis   . Asthma   . Bronchitis   . Chronic shoulder pain   . Epistaxis   . Hypertension   . Insomnia   . Seizures [facility])      Physical Exam: General: The patient is alert and oriented x3 in no acute distress.  Dermatology: Skin is warm, dry and supple bilateral lower extremities. Negative for open lesions or macerations.  Vascular: Palpable pedal pulses bilaterally. No edema or erythema noted. Capillary refill within normal limits.  Neurological: Epicritic and protective threshold grossly intact bilaterally.   Musculoskeletal Exam: Range of motion within normal limits to all pedal and ankle joints bilateral. Muscle strength 5/5 in all groups bilateral.  Today there is negative tenderness to palpation along the plantar fascia right heel   Assessment: 1.  Plantar fasciitis right   Plan of Care:  1. Patient evaluated.  2.  Patient doing well today.  Continue wearing good supportive shoes 3.  Continue meloxicam 15 mg daily as needed 4.  Return to clinic as needed  *Currently not working      Felecia Shelling, DPM [facility]  Dr. Felecia Shelling, DPM    [address] [place], [place] [address]                Office [phone]  Fax [phone]

## 2020-09-23 ENCOUNTER — Other Ambulatory Visit: Payer: Medicare Other

## 2021-01-20 ENCOUNTER — Ambulatory Visit: Payer: Medicare Other | Admitting: Orthopedic Surgery

## 2021-01-27 ENCOUNTER — Other Ambulatory Visit: Payer: Self-pay

## 2021-01-27 ENCOUNTER — Other Ambulatory Visit (HOSPITAL_COMMUNITY)
Admission: RE | Admit: 2021-01-27 | Discharge: 2021-01-27 | Disposition: A | Discharge status: Home / Self Care | Payer: Medicare Other | Source: Ambulatory Visit | Attending: Internal Medicine | Admitting: Internal Medicine

## 2021-01-27 DIAGNOSIS — Z0001 Encounter for general adult medical examination with abnormal findings: Secondary | ICD-10-CM | POA: Insufficient documentation

## 2021-01-27 DIAGNOSIS — I10 Essential (primary) hypertension: Secondary | ICD-10-CM | POA: Diagnosis present

## 2021-01-27 DIAGNOSIS — Z79899 Other long term (current) drug therapy: Secondary | ICD-10-CM | POA: Diagnosis present

## 2021-01-27 LAB — CBC WITH DIFFERENTIAL/PLATELET
Abs Immature Granulocytes: 0.03 10*3/uL (ref 0.00–0.07)
Basophils Absolute: 0 10*3/uL (ref 0.0–0.1)
Basophils Relative: 0 %
Eosinophils Absolute: 0 10*3/uL (ref 0.0–0.5)
Eosinophils Relative: 0 %
HCT: 37.7 % (ref 36.0–46.0)
Hemoglobin: 12.7 g/dL (ref 12.0–15.0)
Immature Granulocytes: 0 %
Lymphocytes Relative: 13 %
Lymphs Abs: 0.9 10*3/uL (ref 0.7–4.0)
MCH: 31.6 pg (ref 26.0–34.0)
MCHC: 33.7 g/dL (ref 30.0–36.0)
MCV: 93.8 fL (ref 80.0–100.0)
Monocytes Absolute: 0.2 10*3/uL (ref 0.1–1.0)
Monocytes Relative: 2 %
Neutro Abs: 6.3 10*3/uL (ref 1.7–7.7)
Neutrophils Relative %: 85 %
Platelets: 311 10*3/uL (ref 150–400)
RBC: 4.02 MIL/uL (ref 3.87–5.11)
RDW: 15.2 % (ref 11.5–15.5)
WBC: 7.4 10*3/uL (ref 4.0–10.5)
nRBC: 0 % (ref 0.0–0.2)

## 2021-01-27 LAB — BASIC METABOLIC PANEL
Anion gap: 8 (ref 5–15)
BUN: 15 mg/dL (ref 6–20)
CO2: 23 mmol/L (ref 22–32)
Calcium: 9.3 mg/dL (ref 8.9–10.3)
Chloride: 109 mmol/L (ref 98–111)
Creatinine, Ser: 0.79 mg/dL (ref 0.44–1.00)
GFR, Estimated: >60 mL/min (ref >60)
Glucose, Bld: 116 mg/dL — ABNORMAL HIGH (ref 70–99)
Potassium: 3.2 mmol/L — ABNORMAL LOW (ref 3.5–5.1)
Sodium: 140 mmol/L (ref 135–145)

## 2021-01-27 LAB — HEPATIC FUNCTION PANEL
ALT: 25 U/L (ref 0–44)
AST: 19 U/L (ref 15–41)
Albumin: 4.1 g/dL (ref 3.5–5.0)
Alkaline Phosphatase: 72 U/L (ref 38–126)
Bilirubin, Direct: <0.1 mg/dL (ref 0.0–0.2)
Total Bilirubin: 0.7 mg/dL (ref 0.3–1.2)
Total Protein: 7.4 g/dL (ref 6.5–8.1)

## 2021-01-27 LAB — LIPID PANEL
Cholesterol: 179 mg/dL (ref 0–200)
HDL: 92 mg/dL (ref >40)
LDL Cholesterol: 66 mg/dL (ref 0–99)
Total CHOL/HDL Ratio: 1.9 RATIO
Triglycerides: 106 mg/dL (ref <150)
VLDL: 21 mg/dL (ref 0–40)

## 2021-01-27 LAB — HEMOGLOBIN A1C
Hgb A1c MFr Bld: 6.1 % — ABNORMAL HIGH (ref 4.8–5.6)
Mean Plasma Glucose: 128.37 mg/dL

## 2021-02-03 ENCOUNTER — Telehealth: Payer: Self-pay | Admitting: Orthopedic Surgery

## 2021-02-03 ENCOUNTER — Other Ambulatory Visit: Payer: Self-pay

## 2021-02-03 ENCOUNTER — Encounter: Payer: Self-pay | Admitting: Orthopedic Surgery

## 2021-02-03 ENCOUNTER — Ambulatory Visit (INDEPENDENT_AMBULATORY_CARE_PROVIDER_SITE_OTHER): Payer: Medicare Other | Admitting: Orthopedic Surgery

## 2021-02-03 ENCOUNTER — Ambulatory Visit: Payer: Medicare Other

## 2021-02-03 VITALS — BP 111/73 | HR 76 | Ht 64.0 in | Wt 157.0 lb

## 2021-02-03 DIAGNOSIS — G8929 Other chronic pain: Secondary | ICD-10-CM

## 2021-02-03 DIAGNOSIS — M1811 Unilateral primary osteoarthritis of first carpometacarpal joint, right hand: Secondary | ICD-10-CM

## 2021-02-03 DIAGNOSIS — M79644 Pain in right finger(s): Secondary | ICD-10-CM

## 2021-02-03 NOTE — Progress Notes (Signed)
Chief Complaint  Patient presents with  . Hand Pain    Right/ review Nerve study    Kirsten Walls had a nerve conduction study done last year by Dr. Ernestina Patches it was negative for carpal tunnel syndrome  She still has pain over the right thumb  X-rays from 2018 shows mild amount of degenerative arthritis at the base of the thumb  The x-ray was repeated  X-ray shows further degeneration of the joint with significant joint space narrowing and subluxation  X-ray examination shows tenderness and swelling over the [facility] joint painful grinding with a grind test decreased pinch  Patient notices functional activities such as opening a jar or twisting off the Have become difficult for her  Diagnosis CMC arthritis right thumb  Recommend referral to hand surgery  Encounter Diagnoses  Name Primary?  . Chronic pain of right thumb   . Arthritis of carpometacarpal (CMC) joint of right thumb Yes

## 2021-02-03 NOTE — Patient Instructions (Signed)
Note for school

## 2021-02-03 NOTE — Telephone Encounter (Signed)
Note issued per after visit summary from today's visit for school.

## 2021-04-19 ENCOUNTER — Emergency Department (HOSPITAL_COMMUNITY): Payer: Medicare Other

## 2021-04-19 ENCOUNTER — Emergency Department (HOSPITAL_COMMUNITY)
Admission: EM | Admit: 2021-04-19 | Discharge: 2021-04-19 | Disposition: A | Discharge status: Home / Self Care | Payer: Medicare Other | Attending: Emergency Medicine | Admitting: Emergency Medicine

## 2021-04-19 ENCOUNTER — Other Ambulatory Visit: Payer: Self-pay

## 2021-04-19 DIAGNOSIS — Z85828 Personal history of other malignant neoplasm of skin: Secondary | ICD-10-CM | POA: Insufficient documentation

## 2021-04-19 DIAGNOSIS — Z79899 Other long term (current) drug therapy: Secondary | ICD-10-CM | POA: Insufficient documentation

## 2021-04-19 DIAGNOSIS — U071 COVID-19: Secondary | ICD-10-CM | POA: Diagnosis not present

## 2021-04-19 DIAGNOSIS — J45909 Unspecified asthma, uncomplicated: Secondary | ICD-10-CM | POA: Insufficient documentation

## 2021-04-19 DIAGNOSIS — F17210 Nicotine dependence, cigarettes, uncomplicated: Secondary | ICD-10-CM | POA: Insufficient documentation

## 2021-04-19 DIAGNOSIS — I10 Essential (primary) hypertension: Secondary | ICD-10-CM | POA: Insufficient documentation

## 2021-04-19 DIAGNOSIS — R059 Cough, unspecified: Secondary | ICD-10-CM | POA: Diagnosis present

## 2021-04-19 LAB — RESP PANEL BY RT-PCR (FLU A&B, COVID) ARPGX2
Influenza A by PCR: NEGATIVE
Influenza B by PCR: NEGATIVE
SARS Coronavirus 2 by RT PCR: POSITIVE — AB

## 2021-04-19 MED ORDER — HYDROCOD POLST-CPM POLST ER 10-8 MG/5ML PO SUER: 5.0000 mL | Freq: Two times a day (BID) | ORAL | 0 refills | Status: DC | PRN

## 2021-04-19 MED ORDER — NIRMATRELVIR/RITONAVIR (PAXLOVID)TABLET: 3.0000 | ORAL_TABLET | Freq: Two times a day (BID) | ORAL | 0 refills | Status: AC

## 2021-04-19 MED ORDER — KETOROLAC TROMETHAMINE 30 MG/ML IJ SOLN
30.0000 mg | Freq: Once | INTRAMUSCULAR | Status: AC
  Administered 2021-04-19: 30 mg via INTRAMUSCULAR
  Filled 2021-04-19: qty 1

## 2021-04-19 NOTE — ED Notes (Signed)
Lab called with covid positive result

## 2021-04-19 NOTE — Discharge Instructions (Addendum)
Person Under Monitoring Name: Kirsten Walls  [place] [id number]  Infection Prevention Recommendations for Individuals Confirmed to have, or Being Evaluated for, 2019 Novel Coronavirus (COVID-19) Infection Who Receive Care at Home  Individuals who are confirmed to have, or are being evaluated for, COVID-19 should follow the prevention steps below until a healthcare provider or local or state health department says they can return to normal activities.  Stay home except to get medical care You should restrict activities outside your home, except for getting medical care. Do not go to work, school, or public areas, and do not use public transportation or taxis.  Call ahead before visiting your doctor Before your medical appointment, call the healthcare provider and tell them that you have, or are being evaluated for, COVID-19 infection. This will help the healthcare provider's office take steps to keep other people from getting infected. Ask your healthcare provider to call the local or state health department.  Monitor your symptoms Seek prompt medical attention if your illness is worsening (e.g., difficulty breathing). Before going to your medical appointment, call the healthcare provider and tell them that you have, or are being evaluated for, COVID-19 infection. Ask your healthcare provider to call the local or state health department.  Wear a facemask You should wear a facemask that covers your nose and mouth when you are in the same room with other people and when you visit a healthcare provider. People who live with or visit you should also wear a facemask while they are in the same room with you.  Separate yourself from other people in your home As much as possible, you should stay in a different room from other people in your home. Also, you should use a separate bathroom, if available.  Avoid sharing household items You should  not share dishes, drinking glasses, cups, eating utensils, towels, bedding, or other items with other people in your home. After using these items, you should wash them thoroughly with soap and water.  Cover your coughs and sneezes Cover your mouth and nose with a tissue when you cough or sneeze, or you can cough or sneeze into your sleeve. Throw used tissues in a lined trash can, and immediately wash your hands with soap and water for at least 20 seconds or use an alcohol-based hand rub.  Wash your hTenet Healthcareyour hands often and thoroughly with soap and water for at least 20 seconds. You can use an alcohol-based hand sanitizer if soap and water are not available and if your hands are not visibly dirty. Avoid touching your eyes, nose, and mouth with unwashed hands.   Prevention Steps for Caregivers and Household Members of Individuals Confirmed to have, or Being Evaluated for, COVID-19 Infection Being Cared for in the Home  If you live with, or provide care at home for, a person confirmed to have, or being evaluated for, COVID-19 infection please follow these guidelines to prevent infection:  Follow healthcare provider's instructions Make sure that you understand and can help the patient follow any healthcare provider instructions for all care.  Provide for the patient's basic needs You should help the patient with basic needs in the home and provide support for getting groceries, prescriptions, and other personal needs.  Monitor the patient's symptoms If they are getting sicker, call his or her medical provider and tell them that the patient has, or is being evaluated for, COVID-19 infection. This will help the healthcare provider's  office take steps to keep other people from getting infected. Ask the healthcare provider to call the local or state health department.  Limit the number of people who have contact with the patient If possible, have only one caregiver for the  patient. Other household members should stay in another home or place of residence. If this is not possible, they should stay in another room, or be separated from the patient as much as possible. Use a separate bathroom, if available. Restrict visitors who do not have an essential need to be in the home.  Keep older adults, very young children, and other sick people away from the patient Keep older adults, very young children, and those who have compromised immune systems or chronic health conditions away from the patient. This includes people with chronic heart, lung, or kidney conditions, diabetes, and cancer.  Ensure good ventilation Make sure that shared spaces in the home have good air flow, such as from an air conditioner or an opened window, weather permitting.  Wash your hands often Wash your hands often and thoroughly with soap and water for at least 20 seconds. You can use an alcohol based hand sanitizer if soap and water are not available and if your hands are not visibly dirty. Avoid touching your eyes, nose, and mouth with unwashed hands. Use disposable paper towels to dry your hands. If not available, use dedicated cloth towels and replace them when they become wet.  Wear a facemask and gloves Wear a disposable facemask at all times in the room and gloves when you touch or have contact with the patient's blood, body fluids, and/or secretions or excretions, such as sweat, saliva, sputum, nasal mucus, vomit, urine, or feces.  Ensure the mask fits over your nose and mouth tightly, and do not touch it during use. Throw out disposable facemasks and gloves after using them. Do not reuse. Wash your hands immediately after removing your facemask and gloves. If your personal clothing becomes contaminated, carefully remove clothing and launder. Wash your hands after handling contaminated clothing. Place all used disposable facemasks, gloves, and other waste in a lined container before  disposing them with other household waste. Remove gloves and wash your hands immediately after handling these items.  Do not share dishes, glasses, or other household items with the patient Avoid sharing household items. You should not share dishes, drinking glasses, cups, eating utensils, towels, bedding, or other items with a patient who is confirmed to have, or being evaluated for, COVID-19 infection. After the person uses these items, you should wash them thoroughly with soap and water.  Wash laundry thoroughly Immediately remove and wash clothes or bedding that have blood, body fluids, and/or secretions or excretions, such as sweat, saliva, sputum, nasal mucus, vomit, urine, or feces, on them. Wear gloves when handling laundry from the patient. Read and follow directions on labels of laundry or clothing items and detergent. In general, wash and dry with the warmest temperatures recommended on the label.  Clean all areas the individual has used often Clean all touchable surfaces, such as counters, tabletops, doorknobs, bathroom fixtures, toilets, phones, keyboards, tablets, and bedside tables, every day. Also, clean any surfaces that may have blood, body fluids, and/or secretions or excretions on them. Wear gloves when cleaning surfaces the patient has come in contact with. Use a diluted bleach solution (e.g., dilute bleach with 1 part bleach and 10 parts water) or a household disinfectant with a label that says EPA-registered for coronaviruses. To make a  bleach solution at home, add 1 tablespoon of bleach to 1 quart (4 cups) of water. For a larger supply, add  cup of bleach to 1 gallon (16 cups) of water. Read labels of cleaning products and follow recommendations provided on product labels. Labels contain instructions for safe and effective use of the cleaning product including precautions you should take when applying the product, such as wearing gloves or eye protection and making sure you  have good ventilation during use of the product. Remove gloves and wash hands immediately after cleaning.  Monitor yourself for signs and symptoms of illness Caregivers and household members are considered close contacts, should monitor their health, and will be asked to limit movement outside of the home to the extent possible. Follow the monitoring steps for close contacts listed on the symptom monitoring form.   ? If you have additional questions, contact your local health department or call the epidemiologist on call at [phone] (available 24/7). ? This guidance is subject to change. For the most up-to-date guidance from [facility], please refer to their website: YouBlogs.pl

## 2021-04-19 NOTE — ED Triage Notes (Signed)
Patient states cough, headache, chills, and bodyaches, and diarrhea for 4 days. Patient denies Covid exposure.

## 2021-04-19 NOTE — ED Provider Notes (Signed)
[facility] EMERGENCY DEPARTMENT Provider Note   CSN: [id number] Arrival date & time: 04/19/21  0947     History Chief Complaint  Patient presents with   URI    Kirsten Walls is a 55 y.o. female.  Pt presents to the ED today with URI sx.  She has a cough, headache, chills, body aches, and diarrhea.  Pt has no known Covid exposure.  She is fully vaccinated.  She has been taking ibuprofen and BC powders without improvement in her pain.  Pt said this is her 3rd day of sx.      Past Medical History:  Diagnosis Date   Arthritis    Asthma    Bronchitis    Chronic shoulder pain    Epistaxis    Hypertension    Insomnia    Seizures ([place])     Patient Active Problem List   Diagnosis Date Noted   Syncope 04/15/2020   Leukocytosis 03/23/2020   Acute blood loss anemia 03/23/2020   GERD (gastroesophageal reflux disease) 03/23/2020   Tobacco use 03/23/2020   Epistaxis 08/26/2019   Insomnia related to another mental disorder 07/30/2017   Chronic insomnia 07/30/2017   History of colonic polyps    Diverticulosis of colon without hemorrhage    Encounter for screening colonoscopy 07/27/2015   Polypharmacy 07/27/2015   Arthritis, shoulder region 04/27/2014   Acute bursitis of right shoulder 02/16/2014   Shoulder injury 02/16/2014   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   BUNION 03/17/2009   H N P-LUMBAR 06/03/2008   KNEE, ARTHRITIS, DEGEN./OSTEO 02/11/2008   NEOPLASMS UNSPEC NATURE BONE SOFT TISSUE&SKIN 02/03/2008   LOW BACK PAIN 02/03/2008   DERANGEMENT MENISCUS 01/23/2008   JOINT EFFUSION, LEFT KNEE 01/23/2008   TEAR MEDIAL MENISCUS 01/23/2008    Past Surgical History:  Procedure Laterality Date   ABDOMINAL HYSTERECTOMY     BUNIONECTOMY  both big toe   CESAREAN SECTION  x2   COLONOSCOPY N/A 08/18/2015   Procedure: COLONOSCOPY;  Surgeon: Daneil Dolin, MD;  Location: [facility];  Service: Endoscopy;  Laterality: N/A;  1000   FOOT SURGERY     KNEE SURGERY      SHOULDER ARTHROSCOPY     TUMOR REMOVAL  left knee cap     OB History     Gravida  4   Para  4   Term  4   Preterm      AB      Living  4      SAB      IAB      Ectopic      Multiple      Live Births              Family History  Problem Relation Age of Onset   Hypertension Mother    Hyperlipidemia Mother    Diabetes Mother    Asthma Other    Colon cancer Neg Hx     Social History   Tobacco Use   Smoking status: Heavy Smoker    Packs/day: 0.50    Years: 30.00    Pack years: 15.00    Types: Cigarettes   Smokeless tobacco: Never   Tobacco comments:    social  Substance Use Topics   Alcohol use: Yes    Comment: social   Drug use: No    Home Medications Prior to Admission medications   Medication Sig Start Date End Date Taking? Authorizing Provider  chlorpheniramine-HYDROcodone Amanda Cockayne PENNKINETIC ER) 10-8 MG/5ML SUER Take  5 mLs by mouth every 12 (twelve) hours as needed for cough. 04/19/21  Yes Isla Pence, MD  nirmatrelvir/ritonavir EUA (PAXLOVID) TABS Take 3 tablets by mouth 2 (two) times daily for 5 days. Patient GFR is >60. Take nirmatrelvir (150 mg) two tablets twice daily for 5 days and ritonavir (100 mg) one tablet twice daily for 5 days. 04/19/21 04/24/21 Yes Isla Pence, MD  albuterol (VENTOLIN HFA) 108 (90 Base) MCG/ACT inhaler Inhale 2 puffs into the lungs every 6 (six) hours as needed. For shortness of breath/asthma/bronchitis    [provider]  amLODipine (NORVASC) 5 MG tablet Take 1 tablet (5 mg total) by mouth daily. 04/17/20 05/17/20  Manuella Ghazi, Pratik D, DO  baclofen (LIORESAL) 10 MG tablet Take 10 mg by mouth 2 (two) times daily as needed for muscle spasms.     [provider]  clobetasol (TEMOVATE) 0.05 % external solution Apply topically. 06/16/20   [provider]  EPINEPHrine (EPIPEN 2-PAK) 0.3 mg/0.3 mL IJ SOAJ injection Inject 0.3 mLs (0.3 mg total) into the muscle as needed for anaphylaxis. Patient not  taking: Reported on 02/03/2021 01/15/19   Fredia Sorrow, MD  Hydrocortisone, Perianal, 1 % CREA Apply topically. 04/05/21   [provider]  hydrOXYzine (VISTARIL) 25 MG capsule Take 25 mg by mouth 3 (three) times daily. 01/06/21   [provider]  IBU 800 MG tablet Take by mouth at bedtime. 06/30/20   [provider]  montelukast (SINGULAIR) 10 MG tablet Take 10 mg by mouth daily. 04/05/21   [provider]  Multiple Vitamin (MULTIVITAMIN PO) Take 1 tablet by mouth daily.    [provider]  pantoprazole (PROTONIX) 40 MG tablet Take 40 mg by mouth 2 (two) times daily. 07/21/20   [provider]  predniSONE (DELTASONE) 20 MG tablet Take 20 mg by mouth daily. 01/06/21   [provider]  topiramate (TOPAMAX) 25 MG tablet Take 100 mg by mouth daily.    [provider]  Vitamin D, Ergocalciferol, (DRISDOL) 1.25 MG (50000 UNIT) CAPS capsule Take by mouth. 06/18/20   [provider]    Allergies    Penicillins, Azithromycin, and Sulfamethoxazole-trimethoprim  Review of Systems   Review of Systems  HENT:  Positive for congestion.   Respiratory:  Positive for cough.   Gastrointestinal:  Positive for diarrhea.  Neurological:  Positive for headaches.  All other systems reviewed and are negative.  Physical Exam Updated Vital Signs BP 109/89   Pulse 71   Temp 98.3 F (36.8 C) (Oral)   Resp 18   Ht '5\' 3"'$  (1.6 m)   Wt 69.9 kg   SpO2 99%   BMI 27.28 kg/m   Physical Exam Vitals and nursing note reviewed.  Constitutional:      Appearance: Normal appearance.  HENT:     Head: Normocephalic and atraumatic.     Right Ear: External ear normal.     Left Ear: External ear normal.     Nose: Nose normal.     Mouth/Throat:     Mouth: Mucous membranes are moist.     Pharynx: Oropharynx is clear.  Eyes:     Extraocular Movements: Extraocular movements intact.     Conjunctiva/sclera: Conjunctivae normal.     Pupils:  Pupils are equal, round, and reactive to light.  Cardiovascular:     Rate and Rhythm: Normal rate and regular rhythm.     Pulses: Normal pulses.     Heart sounds: Normal heart sounds.  Pulmonary:  Effort: Pulmonary effort is normal.     Breath sounds: Normal breath sounds.  Abdominal:     General: Abdomen is flat. Bowel sounds are normal.     Palpations: Abdomen is soft.  Musculoskeletal:        General: Normal range of motion.     Cervical back: Normal range of motion and neck supple.  Skin:    General: Skin is warm.     Capillary Refill: Capillary refill takes less than 2 seconds.  Neurological:     General: No focal deficit present.     Mental Status: She is alert and oriented to person, place, and time.  Psychiatric:        Mood and Affect: Mood normal.        Behavior: Behavior normal.        Thought Content: Thought content normal.        Judgment: Judgment normal.    ED Results / Procedures / Treatments   Labs (all labs ordered are listed, but only abnormal results are displayed) Labs Reviewed  RESP PANEL BY RT-PCR (FLU A&B, COVID) ARPGX2 - Abnormal; Notable for the following components:      Result Value   SARS Coronavirus 2 by RT PCR POSITIVE (*)    All other components within normal limits    EKG None  Radiology DG Chest Portable 1 View  Result Date: 04/19/2021 CLINICAL DATA:  Cough. EXAM: PORTABLE CHEST 1 VIEW COMPARISON:  07/22/2015. FINDINGS: Mediastinum and hilar structures normal. Cardiomegaly. No pulmonary venous congestion. Low lung volumes with mild bibasilar atelectasis. No pleural effusion or pneumothorax. IMPRESSION: 1.  Cardiomegaly.  No pulmonary venous congestion. 2.  Low lung volumes with mild bibasilar subsegmental atelectasis. Electronically Signed   By: Marcello Moores  Register   On: 04/19/2021 11:41    Procedures Procedures   Medications Ordered in ED Medications  ketorolac (TORADOL) 30 MG/ML injection 30 mg (30 mg Intramuscular Given 04/19/21  1124)    ED Course  I have reviewed the triage vital signs and the nursing notes.  Pertinent labs & imaging results that were available during my care of the patient were reviewed by me and considered in my medical decision making (see chart for details).    MDM Rules/Calculators/A&P                           Pt is Covid +.  She has no pna on cxr and her O2 sat is 99%.  She does have some risk factors for worsening of Covid, so I will start her on Paxlovid.  She had a nl GFR 1 month ago, so I don't think her labs need to be repeated.  She is to return if worse and f/u with pcp. Final Clinical Impression(s) / ED Diagnoses Final diagnoses:  [id number]    Rx / DC Orders ED Discharge Orders          Ordered    nirmatrelvir/ritonavir EUA (PAXLOVID) TABS  2 times daily        04/19/21 1217    chlorpheniramine-HYDROcodone (TUSSIONEX PENNKINETIC ER) 10-8 MG/5ML SUER  Every 12 hours PRN        04/19/21 1217             Isla Pence, MD 04/19/21 1220

## 2021-04-27 ENCOUNTER — Ambulatory Visit
Admission: RE | Admit: 2021-04-27 | Discharge: 2021-04-27 | Disposition: A | Discharge status: Home / Self Care | Payer: Medicare Other | Source: Ambulatory Visit | Attending: Family Medicine | Admitting: Family Medicine

## 2021-04-27 ENCOUNTER — Other Ambulatory Visit: Payer: Self-pay

## 2021-04-27 DIAGNOSIS — Z1152 Encounter for screening for COVID-19: Secondary | ICD-10-CM

## 2021-04-27 NOTE — ED Triage Notes (Signed)
Needs covid test

## 2021-04-28 LAB — SARS-COV-2, NAA 2 DAY TAT

## 2021-04-28 LAB — NOVEL CORONAVIRUS, NAA: SARS-CoV-2, NAA: NOT DETECTED

## 2021-07-25 IMAGING — MR MR HEAD W/O CM
7 of 8 series · 35 of 48 positions shown · non-contrast
Comparison: Head CT earlier today.  Brain MRI 04/08/2012.

CLINICAL DATA: 53-year-old female with altered mental status.
Headache, epistaxis.

EXAM:
MRI HEAD WITHOUT CONTRAST
TECHNIQUE: Multiplanar, multiecho pulse sequences of the brain and surrounding
structures were obtained without intravenous contrast.

[Series 5: DWI · axial · 4.0mm · 0.90mm/px · z∈[-124,-8]mm · 5 of 32 slices shown (1 of 3)]
[im 1/32]
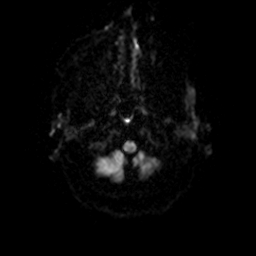
[im 8/32]
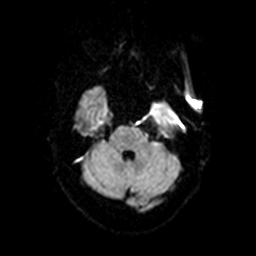
[im 16/32]
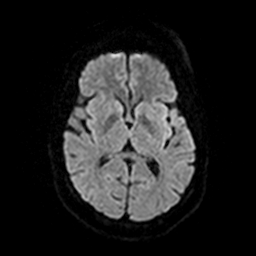
[im 24/32]
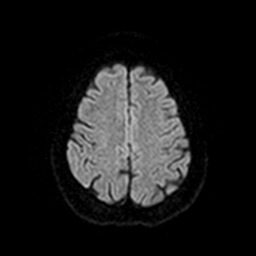
[im 32/32]
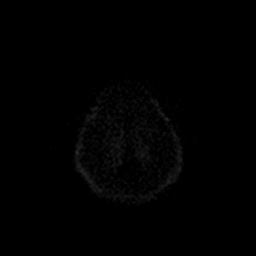

[Series 7: DWI · coronal · 4.0mm · 0.90mm/px · 5 of 32 slices shown (2 of 3)]
[im 1/32]
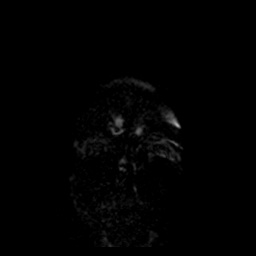
[im 8/32]
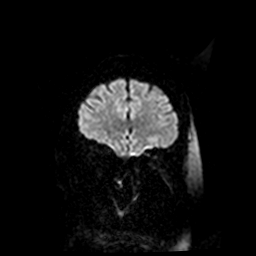
[im 16/32]
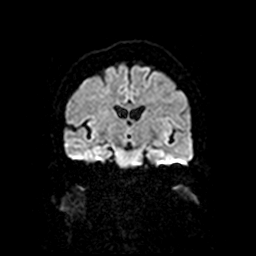
[im 24/32]
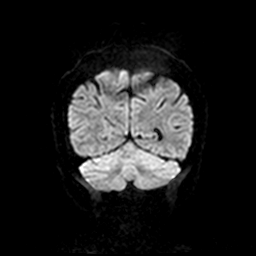
[im 32/32]
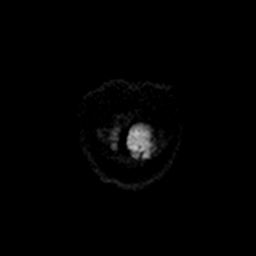

[Series 7: DWI · coronal · 4.0mm · 0.90mm/px · 5 of 32 slices shown (3 of 3)]
[im 1/32]
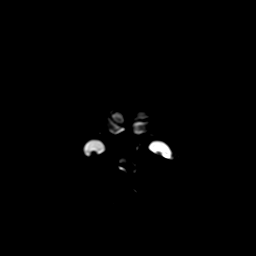
[im 8/32]
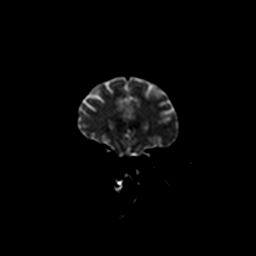
[im 16/32]
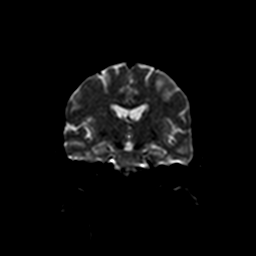
[im 24/32]
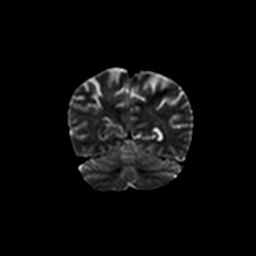
[im 32/32]
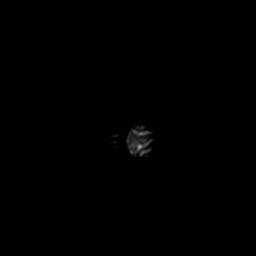

[Series 10: T2 · axial · 4.0mm · 0.45mm/px · z∈[-131,-0]mm · 6 of 36 slices shown (1 of 2)]
[im 1/36]
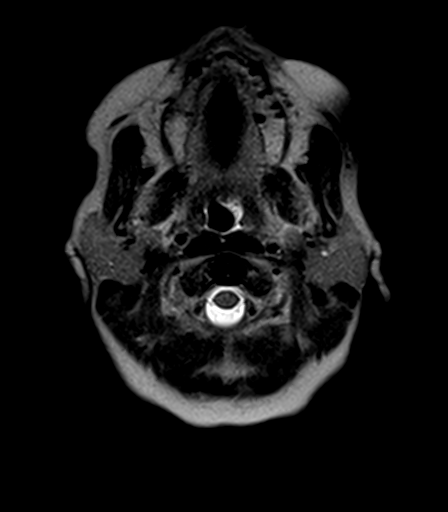
[im 8/36]
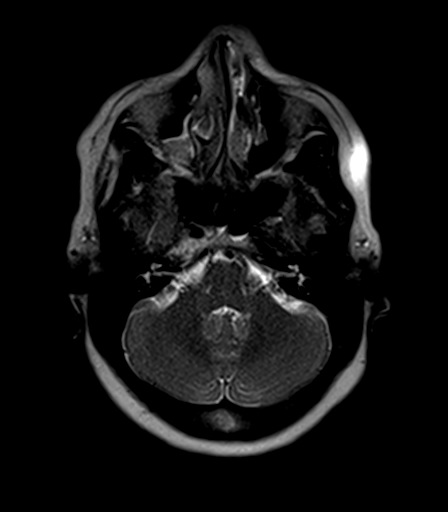
[im 15/36]
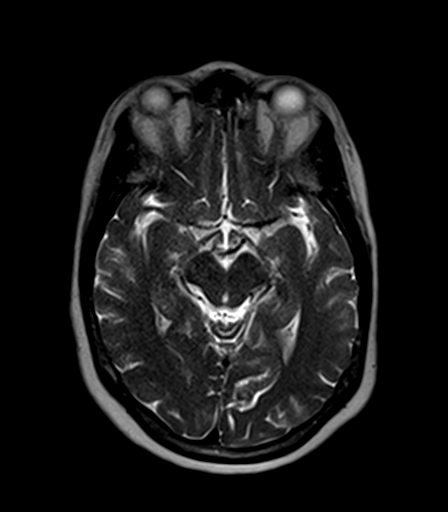
[im 22/36]
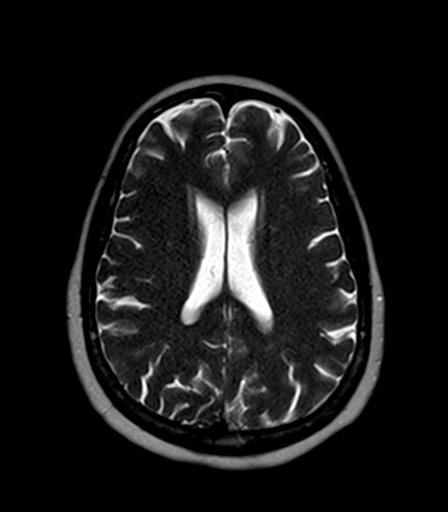
[im 29/36]
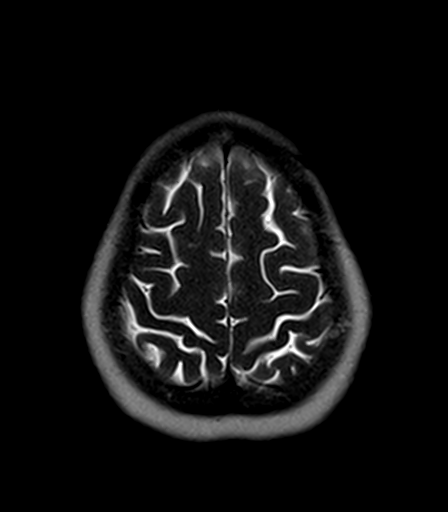
[im 36/36]
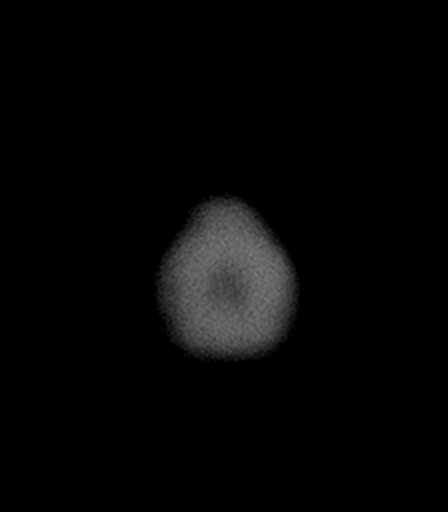

[Series 11: FLAIR · axial · 4.0mm · 0.43mm/px · z∈[-129,+2]mm · 6 of 36 slices shown]
[im 1/36]
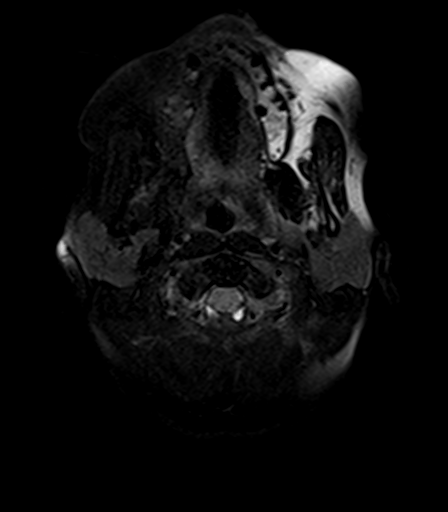
[im 8/36]
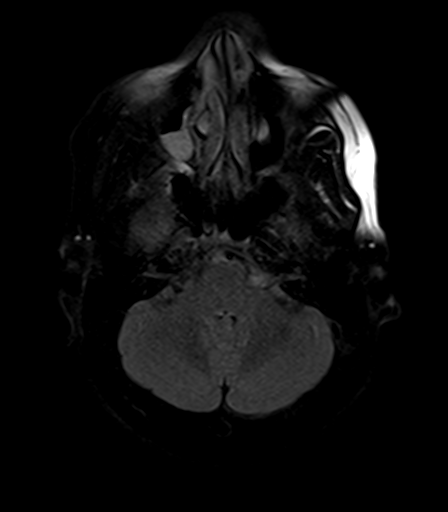
[im 15/36]
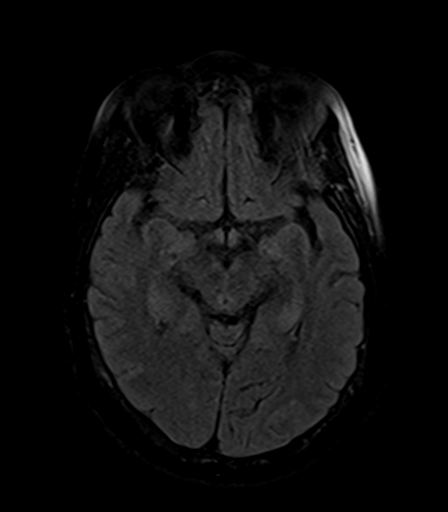
[im 22/36]
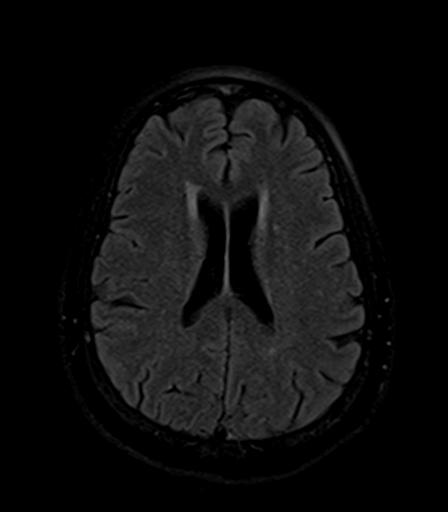
[im 29/36]
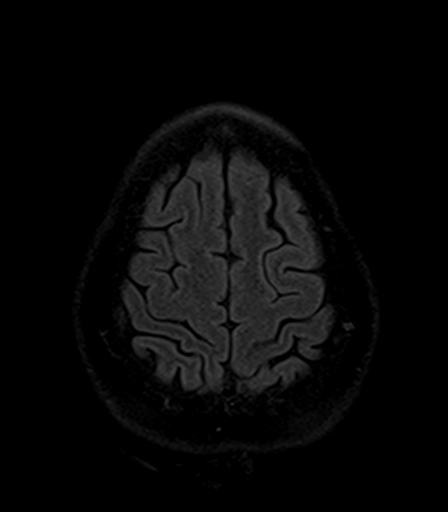
[im 36/36]
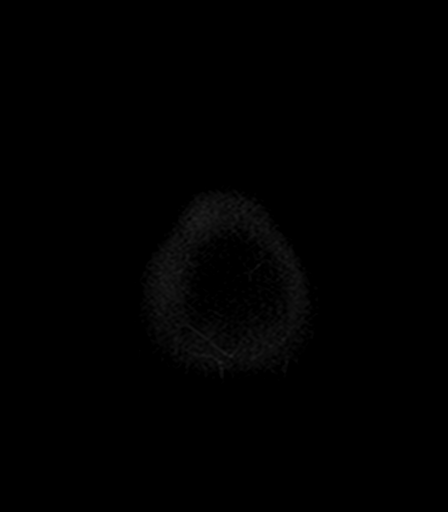

[Series 12: T1 · axial · 4.0mm · 0.90mm/px · z∈[-131,-79]mm · 3 of 36 slices shown]
[im 1/36]
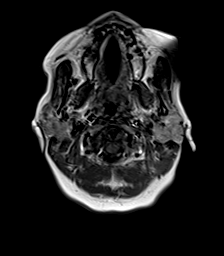
[im 8/36]
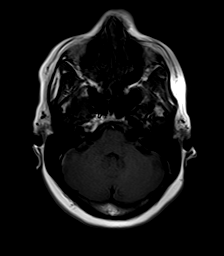
[im 15/36]
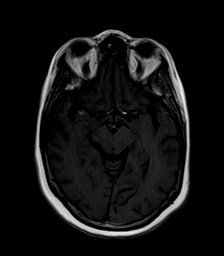

[Series 18: T2 · coronal · 4.0mm · 0.45mm/px · 5 of 32 slices shown (2 of 2)]
[im 1/32]
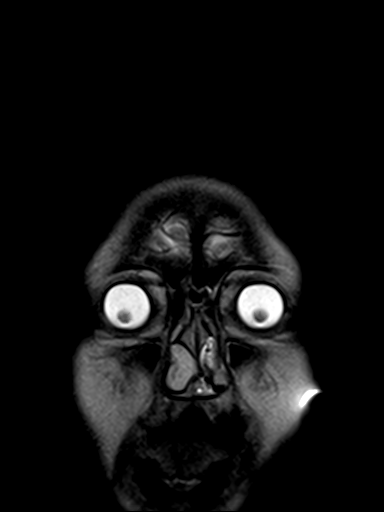
[im 8/32]
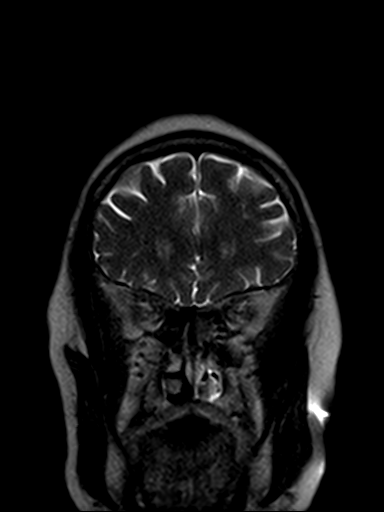
[im 16/32]
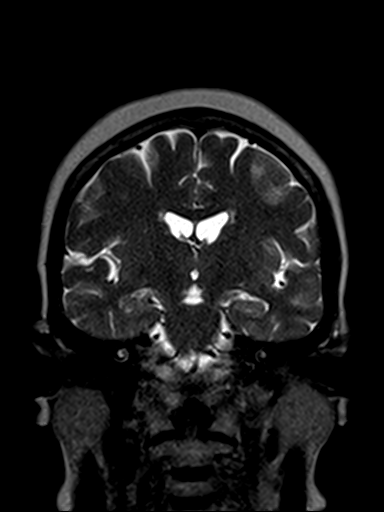
[im 24/32]
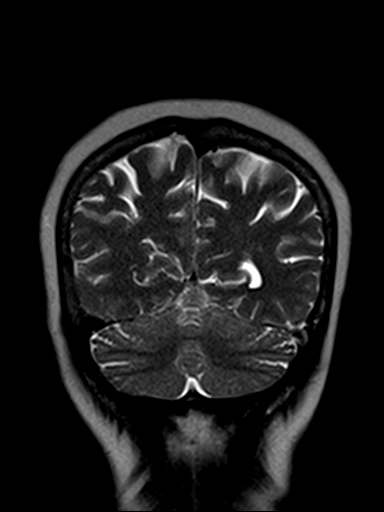
[im 32/32]
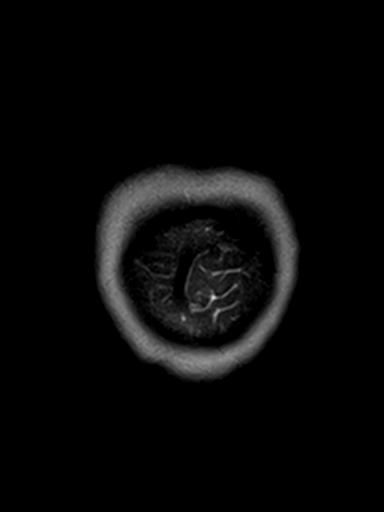

[35 of 48 positions shown; findings below may reference images not displayed]

FINDINGS: Brain: Cerebral volume remains within normal limits. No restricted
diffusion to suggest acute infarction. No midline shift, mass
effect, evidence of mass lesion, ventriculomegaly, extra-axial
collection or acute intracranial hemorrhage. Cervicomedullary
junction and pituitary are within normal limits.

Gray and white matter signal is within normal limits for age
throughout the brain. No cortical encephalomalacia or chronic
cerebral blood products identified.

Vascular: Major intracranial vascular flow voids are stable since
2263.

Skull and upper cervical spine: Negative visible cervical spine.
Visualized bone marrow signal is within normal limits.

Sinuses/Orbits: Negative orbits. Small fluid levels in both
maxillary sinuses, and fluid in the left nasal cavity and
nasopharynx (series 10, image 3) which might be epistaxis related.

Other: Mastoids are clear. Scalp and face soft tissues appear
negative.
IMPRESSION: 1. No acute intracranial abnormality. Normal for age noncontrast MRI
appearance of the brain.

2. Some fluid in the left nasal cavity and nasopharynx which might
be epistaxis related.

## 2021-07-25 IMAGING — US US CAROTID DUPLEX BILAT
1 series · 13 of 24 positions shown · non-contrast
Comparison: None.

CLINICAL DATA: Syncope, hypertension, stroke symptoms

EXAM:
BILATERAL CAROTID DUPLEX ULTRASOUND
TECHNIQUE: Gray scale imaging, color Doppler and duplex ultrasound were
performed of bilateral carotid and vertebral arteries in the neck.

[Series 1: us carotid bilateral · 13 of 68 slices shown]
[im 1/68]
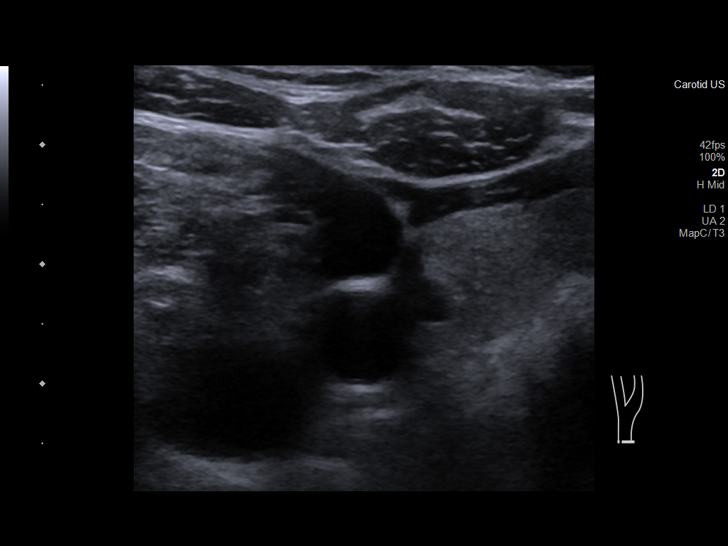
[im 6/68]
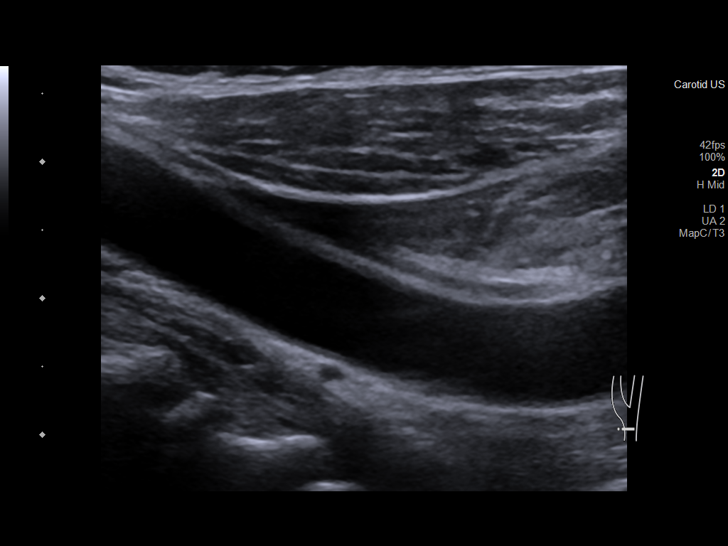
[im 12/68]
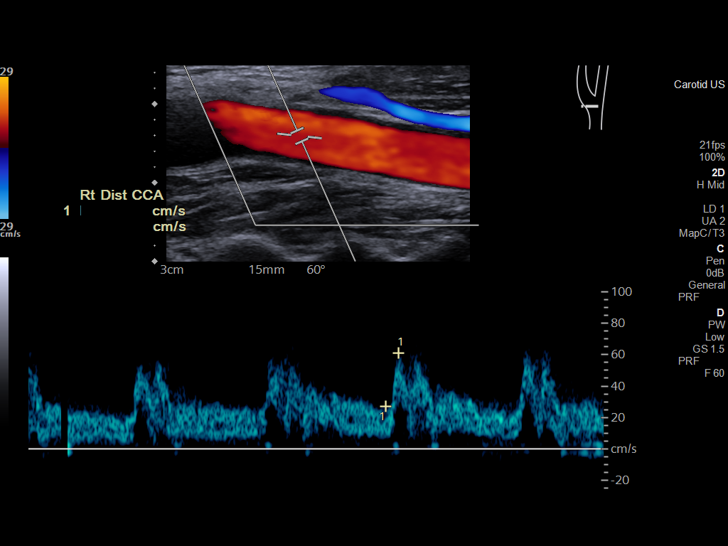
[im 18/68]
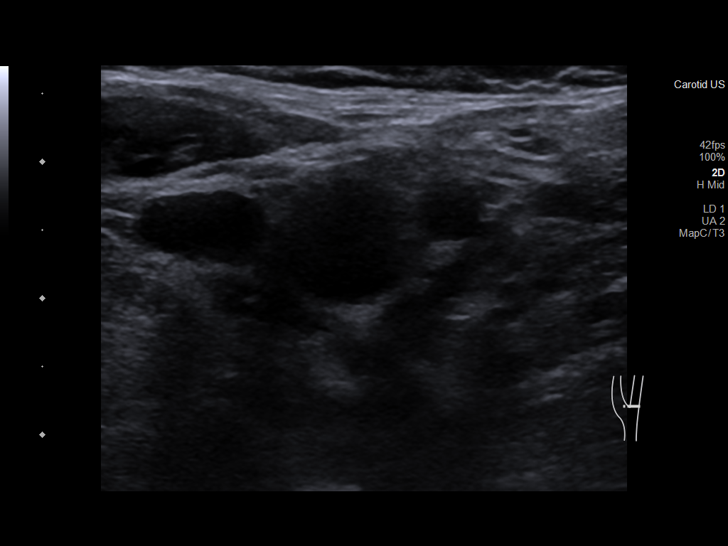
[im 24/68]
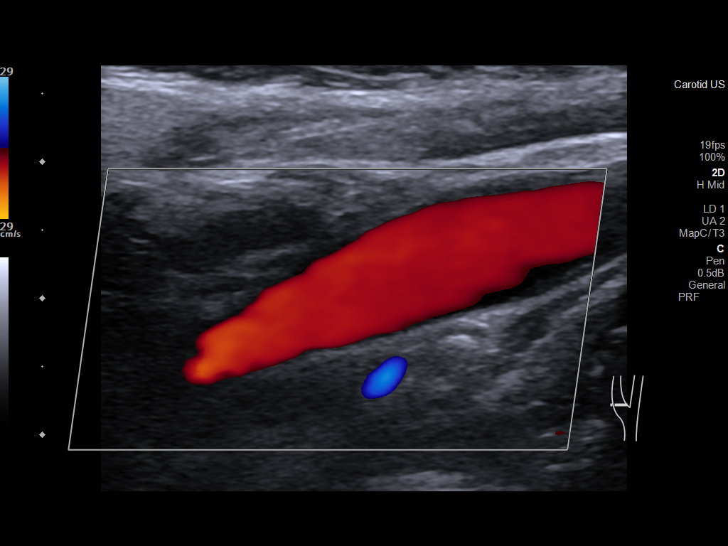
[im 30/68]
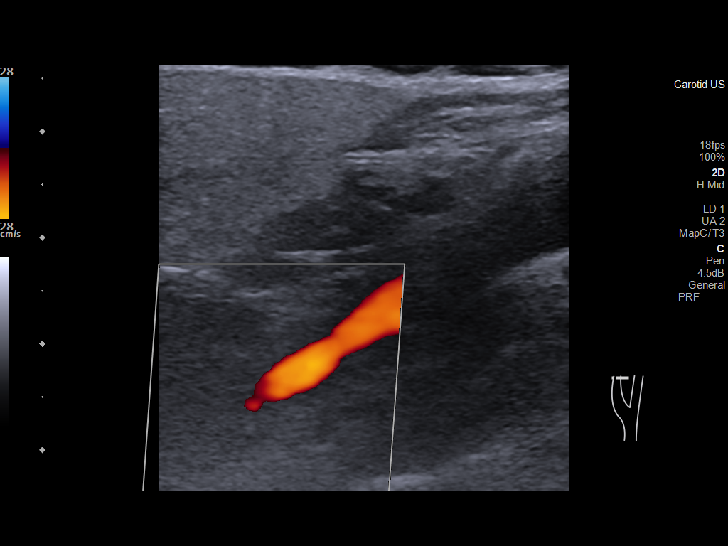
[im 35/68]
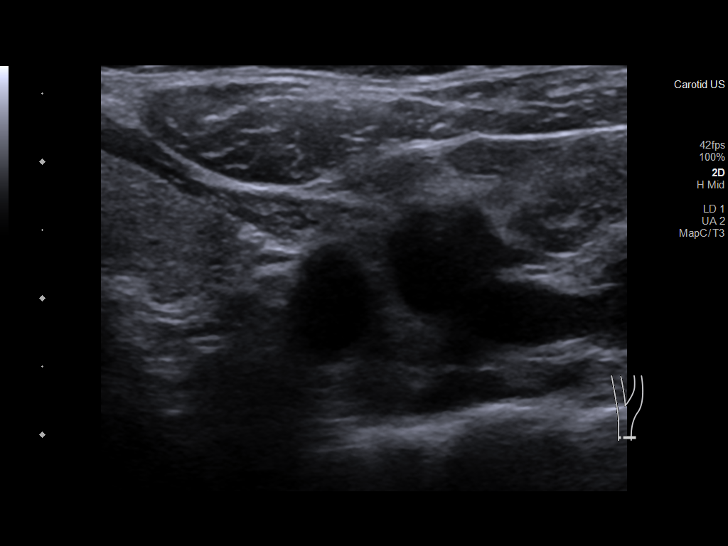
[im 38/68]
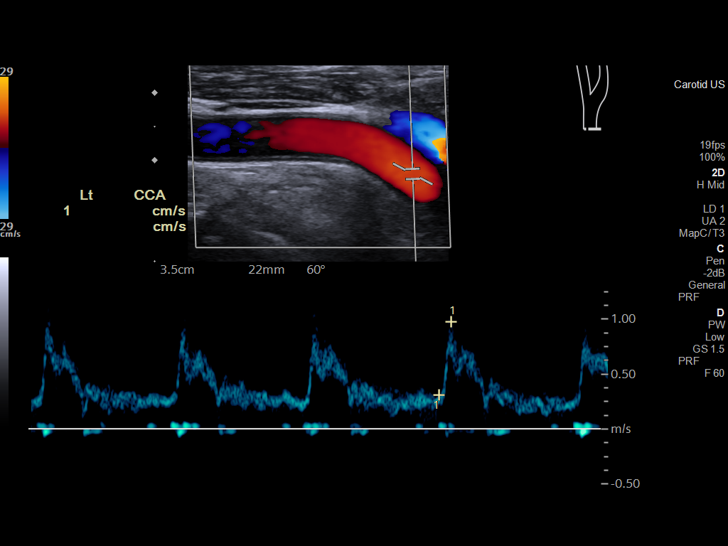
[im 44/68]
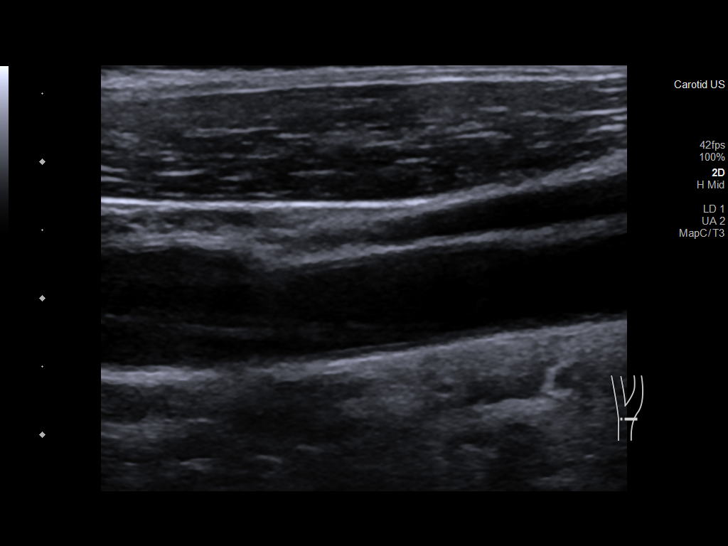
[im 50/68]
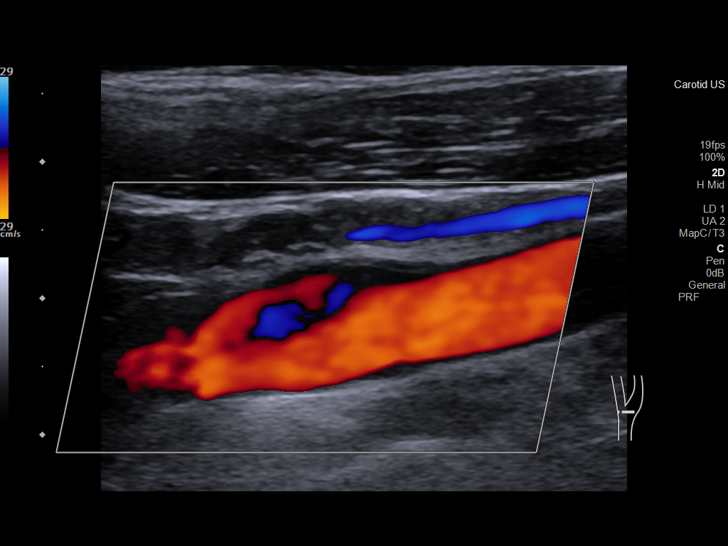
[im 56/68]
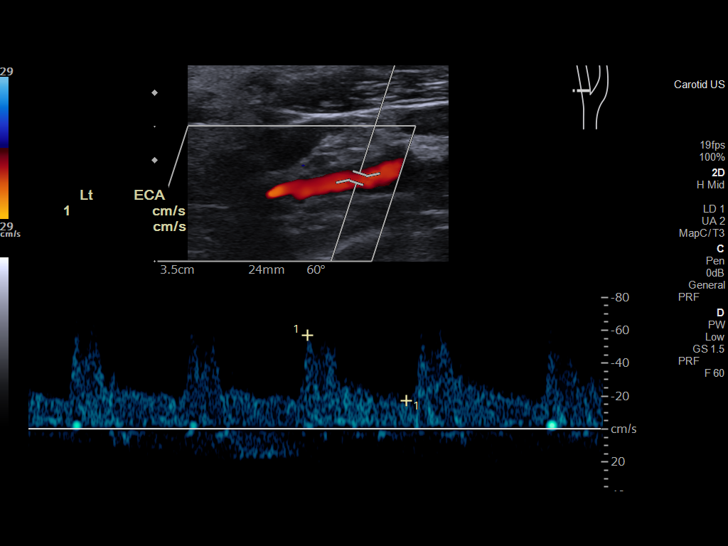
[im 62/68]
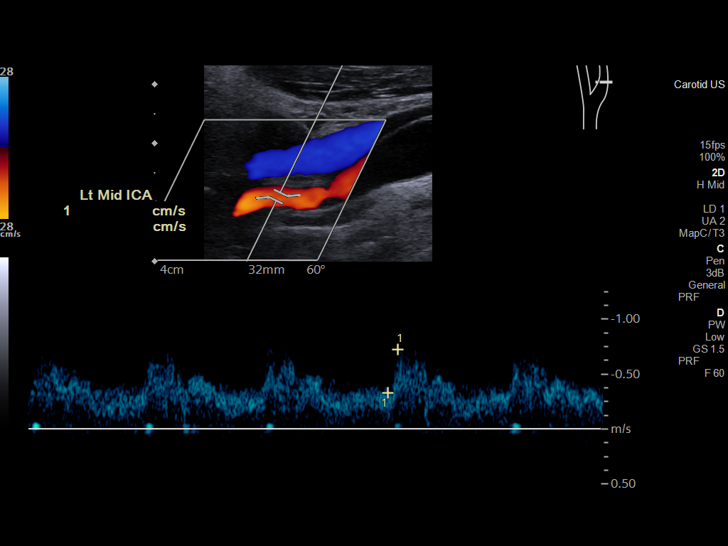
[im 68/68]
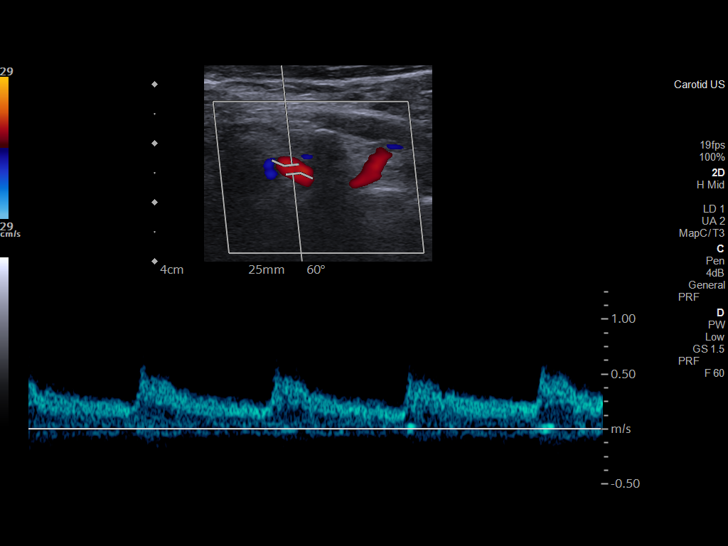

[13 of 24 positions shown; findings below may reference images not displayed]

FINDINGS: Criteria: Quantification of carotid stenosis is based on velocity
parameters that correlate the residual internal carotid diameter
with NASCET-based stenosis levels, using the diameter of the distal
internal carotid lumen as the denominator for stenosis measurement.

The following velocity measurements were obtained:

RIGHT

ICA: 116/46 cm/sec

CCA: 76/29 cm/sec

SYSTOLIC ICA/CCA RATIO:

ECA: 70 cm/sec

LEFT

ICA: 72/33 cm/sec

CCA: 76/28 cm/sec

SYSTOLIC ICA/CCA RATIO:

ECA: 57 cm/sec

RIGHT CAROTID ARTERY: No significant atherosclerotic change. No
hemodynamically significant stenosis, velocity elevation, turbulent
flow. Negative for stenosis.

RIGHT VERTEBRAL ARTERY:  Normal antegrade flow

LEFT CAROTID ARTERY: Very minimal hypoechoic bifurcation
atherosclerotic change. No hemodynamically significant left ICA
stenosis, velocity elevation, turbulent flow. Degree of narrowing
less than 50% by ultrasound criteria.

LEFT VERTEBRAL ARTERY:  Normal antegrade flow
IMPRESSION: Minor carotid atherosclerosis. No hemodynamically significant ICA
stenosis. Degree of narrowing less than 50% bilaterally by
ultrasound criteria.

Patent antegrade vertebral flow bilaterally

## 2022-02-03 ENCOUNTER — Other Ambulatory Visit (HOSPITAL_COMMUNITY)
Admission: RE | Admit: 2022-02-03 | Discharge: 2022-02-03 | Disposition: A | Discharge status: Home / Self Care | Payer: Medicare Other | Source: Ambulatory Visit | Attending: Internal Medicine | Admitting: Internal Medicine

## 2022-02-03 DIAGNOSIS — Z0001 Encounter for general adult medical examination with abnormal findings: Secondary | ICD-10-CM | POA: Diagnosis present

## 2022-02-03 DIAGNOSIS — R569 Unspecified convulsions: Secondary | ICD-10-CM | POA: Diagnosis present

## 2022-02-03 DIAGNOSIS — K219 Gastro-esophageal reflux disease without esophagitis: Secondary | ICD-10-CM | POA: Insufficient documentation

## 2022-02-03 DIAGNOSIS — I10 Essential (primary) hypertension: Secondary | ICD-10-CM | POA: Diagnosis present

## 2022-02-03 DIAGNOSIS — Z79899 Other long term (current) drug therapy: Secondary | ICD-10-CM | POA: Insufficient documentation

## 2022-02-03 DIAGNOSIS — Z1159 Encounter for screening for other viral diseases: Secondary | ICD-10-CM | POA: Insufficient documentation

## 2022-02-03 LAB — HEPATIC FUNCTION PANEL
ALT: 23 U/L (ref 0–44)
AST: 19 U/L (ref 15–41)
Albumin: 4 g/dL (ref 3.5–5.0)
Alkaline Phosphatase: 85 U/L (ref 38–126)
Bilirubin, Direct: 0.1 mg/dL (ref 0.0–0.2)
Indirect Bilirubin: 0.4 mg/dL (ref 0.3–0.9)
Total Bilirubin: 0.5 mg/dL (ref 0.3–1.2)
Total Protein: 7.2 g/dL (ref 6.5–8.1)

## 2022-02-03 LAB — CBC WITH DIFFERENTIAL/PLATELET
Abs Immature Granulocytes: 0.02 10*3/uL (ref 0.00–0.07)
Basophils Absolute: 0.1 10*3/uL (ref 0.0–0.1)
Basophils Relative: 1 %
Eosinophils Absolute: 0.1 10*3/uL (ref 0.0–0.5)
Eosinophils Relative: 2 %
HCT: 40.2 % (ref 36.0–46.0)
Hemoglobin: 13.4 g/dL (ref 12.0–15.0)
Immature Granulocytes: 0 %
Lymphocytes Relative: 26 %
Lymphs Abs: 1.7 10*3/uL (ref 0.7–4.0)
MCH: 31.3 pg (ref 26.0–34.0)
MCHC: 33.3 g/dL (ref 30.0–36.0)
MCV: 93.9 fL (ref 80.0–100.0)
Monocytes Absolute: 0.4 10*3/uL (ref 0.1–1.0)
Monocytes Relative: 7 %
Neutro Abs: 4.3 10*3/uL (ref 1.7–7.7)
Neutrophils Relative %: 64 %
Platelets: 306 10*3/uL (ref 150–400)
RBC: 4.28 MIL/uL (ref 3.87–5.11)
RDW: 14.6 % (ref 11.5–15.5)
WBC: 6.7 10*3/uL (ref 4.0–10.5)
nRBC: 0.3 % — ABNORMAL HIGH (ref 0.0–0.2)

## 2022-02-03 LAB — BASIC METABOLIC PANEL
Anion gap: 6 (ref 5–15)
BUN: 16 mg/dL (ref 6–20)
CO2: 25 mmol/L (ref 22–32)
Calcium: 9.7 mg/dL (ref 8.9–10.3)
Chloride: 110 mmol/L (ref 98–111)
Creatinine, Ser: 0.89 mg/dL (ref 0.44–1.00)
GFR, Estimated: >60 mL/min (ref >60)
Glucose, Bld: 91 mg/dL (ref 70–99)
Potassium: 4.2 mmol/L (ref 3.5–5.1)
Sodium: 141 mmol/L (ref 135–145)

## 2022-02-03 LAB — LIPID PANEL
Cholesterol: 165 mg/dL (ref 0–200)
HDL: 78 mg/dL (ref >40)
LDL Cholesterol: 65 mg/dL (ref 0–99)
Total CHOL/HDL Ratio: 2.1 RATIO
Triglycerides: 109 mg/dL (ref <150)
VLDL: 22 mg/dL (ref 0–40)

## 2022-02-03 LAB — HEPATITIS C ANTIBODY: HCV Ab: NONREACTIVE

## 2022-03-28 ENCOUNTER — Emergency Department (HOSPITAL_COMMUNITY)
Admission: EM | Admit: 2022-03-28 | Discharge: 2022-03-28 | Disposition: A | Discharge status: Home / Self Care | Payer: Medicare Other | Attending: Emergency Medicine | Admitting: Emergency Medicine

## 2022-03-28 ENCOUNTER — Other Ambulatory Visit: Payer: Self-pay

## 2022-03-28 ENCOUNTER — Encounter (HOSPITAL_COMMUNITY): Payer: Self-pay | Admitting: *Deleted

## 2022-03-28 DIAGNOSIS — J45909 Unspecified asthma, uncomplicated: Secondary | ICD-10-CM | POA: Insufficient documentation

## 2022-03-28 DIAGNOSIS — W228XXA Striking against or struck by other objects, initial encounter: Secondary | ICD-10-CM | POA: Insufficient documentation

## 2022-03-28 DIAGNOSIS — I10 Essential (primary) hypertension: Secondary | ICD-10-CM | POA: Diagnosis not present

## 2022-03-28 DIAGNOSIS — S299XXA Unspecified injury of thorax, initial encounter: Secondary | ICD-10-CM | POA: Diagnosis present

## 2022-03-28 DIAGNOSIS — Z79899 Other long term (current) drug therapy: Secondary | ICD-10-CM | POA: Diagnosis not present

## 2022-03-28 DIAGNOSIS — S2001XA Contusion of right breast, initial encounter: Secondary | ICD-10-CM | POA: Insufficient documentation

## 2022-03-28 MED ORDER — HYDROCODONE-ACETAMINOPHEN 5-325 MG PO TABS: 1.0000 | ORAL_TABLET | Freq: Four times a day (QID) | ORAL | 0 refills | Status: DC | PRN

## 2022-03-28 NOTE — ED Triage Notes (Signed)
Pt c/o right breast pain, swelling, discoloration after falling Sunday and hitting her breast on the corner of a table.

## 2022-03-28 NOTE — Discharge Instructions (Signed)
Return for any new or worse symptoms.  Would recommend taking Motrin and supplementing with the hydrocodone as needed for pain relief.  Make an appointment follow-up with your primary care doctor to have the left breast rechecked.  Would expect this to remain swollen and bruised for at least 2 weeks.  Bra support will be helpful.

## 2022-03-28 NOTE — ED Provider Notes (Signed)
[facility] EMERGENCY DEPARTMENT Provider Note   CSN: [id number] Arrival date & time: 03/28/22  8366     History  Chief Complaint  Patient presents with   Breast Pain    Kirsten Walls is a 56 y.o. female.  Patient with a fall on Sunday struck her left breast on the corner of something in her bedroom.  Patient just tripped.  No other injury.  Patient states she is got a lot of bruising and swelling to the left breast.  Denies any breathing problems denies any chest wall discomfort.  Patient is been taking Motrin at home without much relief.  No other injuries did not hit her head no loss of consciousness.  Past medical history significant for hypertension bronchitis asthma seizures.  Patient had an abdominal hysterectomy.  Patient is not on any blood thinners.       Home Medications Prior to Admission medications   Medication Sig Start Date End Date Taking? Authorizing Provider  HYDROcodone-acetaminophen (NORCO/VICODIN) 5-325 MG tablet Take 1 tablet by mouth every 6 (six) hours as needed. 03/28/22  Yes Fredia Sorrow, MD  albuterol (VENTOLIN HFA) 108 (90 Base) MCG/ACT inhaler Inhale 2 puffs into the lungs every 6 (six) hours as needed. For shortness of breath/asthma/bronchitis    [provider]  amLODipine (NORVASC) 5 MG tablet Take 1 tablet (5 mg total) by mouth daily. 04/17/20 05/17/20  Manuella Ghazi, Pratik D, DO  baclofen (LIORESAL) 10 MG tablet Take 10 mg by mouth 2 (two) times daily as needed for muscle spasms.     [provider]  chlorpheniramine-HYDROcodone (TUSSIONEX PENNKINETIC ER) 10-8 MG/5ML SUER Take 5 mLs by mouth every 12 (twelve) hours as needed for cough. 04/19/21   Isla Pence, MD  clobetasol (TEMOVATE) 0.05 % external solution Apply topically. 06/16/20   [provider]  EPINEPHrine (EPIPEN 2-PAK) 0.3 mg/0.3 mL IJ SOAJ injection Inject 0.3 mLs (0.3 mg total) into the muscle as needed for anaphylaxis. Patient not taking: Reported on  02/03/2021 01/15/19   Fredia Sorrow, MD  Hydrocortisone, Perianal, 1 % CREA Apply topically. 04/05/21   [provider]  hydrOXYzine (VISTARIL) 25 MG capsule Take 25 mg by mouth 3 (three) times daily. 01/06/21   [provider]  IBU 800 MG tablet Take by mouth at bedtime. 06/30/20   [provider]  montelukast (SINGULAIR) 10 MG tablet Take 10 mg by mouth daily. 04/05/21   [provider]  Multiple Vitamin (MULTIVITAMIN PO) Take 1 tablet by mouth daily.    [provider]  pantoprazole (PROTONIX) 40 MG tablet Take 40 mg by mouth 2 (two) times daily. 07/21/20   [provider]  predniSONE (DELTASONE) 20 MG tablet Take 20 mg by mouth daily. 01/06/21   [provider]  topiramate (TOPAMAX) 25 MG tablet Take 100 mg by mouth daily.    [provider]  Vitamin D, Ergocalciferol, (DRISDOL) 1.25 MG (50000 UNIT) CAPS capsule Take by mouth. 06/18/20   [provider]      Allergies    Penicillins, Topiramate, Azithromycin, and Sulfamethoxazole-trimethoprim    Review of Systems   Review of Systems  Constitutional:  Negative for chills and fever.  HENT:  Negative for ear pain and sore throat.   Eyes:  Negative for pain and visual disturbance.  Respiratory:  Negative for cough and shortness of breath.   Cardiovascular:  Positive for chest pain. Negative for palpitations.  Gastrointestinal:  Negative for abdominal pain and vomiting.  Genitourinary:  Negative for dysuria  and hematuria.  Musculoskeletal:  Negative for arthralgias and back pain.  Skin:  Negative for color change and rash.  Neurological:  Negative for seizures and syncope.  All other systems reviewed and are negative.   Physical Exam Updated Vital Signs BP (!) 154/107 (BP Location: Left Arm)   Pulse 60   Temp 98 F (36.7 C) (Oral)   Resp 18   Ht 1.6 m ('5\' 3"'$ )   Wt 71.2 kg   SpO2 99%   BMI 27.81 kg/m  Physical Exam Vitals and nursing note reviewed.   Constitutional:      General: She is not in acute distress.    Appearance: She is well-developed.  HENT:     Head: Normocephalic and atraumatic.  Eyes:     Conjunctiva/sclera: Conjunctivae normal.  Cardiovascular:     Rate and Rhythm: Normal rate and regular rhythm.     Heart sounds: No murmur heard. Pulmonary:     Effort: Pulmonary effort is normal. No respiratory distress.     Breath sounds: Normal breath sounds. No wheezing or rales.     Comments: Left breast without any significant findings.  Right breast has a lot of bruising contusion and firmness suggestive of a of possible hematoma or just inflammation.  No significant redness to the skin. Chest:     Chest wall: Tenderness present.  Abdominal:     Palpations: Abdomen is soft.     Tenderness: There is no abdominal tenderness.  Musculoskeletal:        General: No swelling.     Cervical back: Neck supple.  Skin:    General: Skin is warm and dry.     Capillary Refill: Capillary refill takes less than 2 seconds.  Neurological:     General: No focal deficit present.     Mental Status: She is alert and oriented to person, place, and time.  Psychiatric:        Mood and Affect: Mood normal.     ED Results / Procedures / Treatments   Labs (all labs ordered are listed, but only abnormal results are displayed) Labs Reviewed - No data to display  EKG None  Radiology No results found.  Procedures Procedures    Medications Ordered in ED Medications - No data to display  ED Course/ Medical Decision Making/ A&P                           Medical Decision Making Risk Prescription drug management.   Status post fall.  Patient does not feel that she needs a chest x-ray no concerns about rib pain or trouble breathing.  Oxygen sats are in the upper 90s.  Patient just wanting some pain relief for the right breast hematoma contusion.  No other concerns from the fall.  Patient does not take blood thinners.  We will  treat with a prepack of hydrocodone have her follow-up with her primary care doctor.   Final Clinical Impression(s) / ED Diagnoses Final diagnoses:  Contusion of right breast, initial encounter    Rx / DC Orders ED Discharge Orders          Ordered    HYDROcodone-acetaminophen (NORCO/VICODIN) 5-325 MG tablet  Every 6 hours PRN        03/28/22 0817              Fredia Sorrow, MD 03/28/22 [phone]

## 2022-03-29 MED FILL — Hydrocodone-Acetaminophen Tab 5-325 MG: ORAL | Qty: 6 | Status: AC

## 2022-07-19 ENCOUNTER — Other Ambulatory Visit: Payer: Self-pay

## 2022-10-18 ENCOUNTER — Other Ambulatory Visit (HOSPITAL_COMMUNITY)
Admission: RE | Admit: 2022-10-18 | Discharge: 2022-10-18 | Disposition: A | Discharge status: Home / Self Care | Payer: 59 | Source: Ambulatory Visit | Attending: Internal Medicine | Admitting: Internal Medicine

## 2022-10-18 DIAGNOSIS — I10 Essential (primary) hypertension: Secondary | ICD-10-CM | POA: Insufficient documentation

## 2022-10-18 DIAGNOSIS — Z0001 Encounter for general adult medical examination with abnormal findings: Secondary | ICD-10-CM | POA: Insufficient documentation

## 2022-10-18 DIAGNOSIS — K219 Gastro-esophageal reflux disease without esophagitis: Secondary | ICD-10-CM | POA: Insufficient documentation

## 2022-10-18 DIAGNOSIS — R569 Unspecified convulsions: Secondary | ICD-10-CM | POA: Diagnosis not present

## 2022-10-18 LAB — HEPATIC FUNCTION PANEL
ALT: 43 U/L (ref 0–44)
AST: 29 U/L (ref 15–41)
Albumin: 4 g/dL (ref 3.5–5.0)
Alkaline Phosphatase: 85 U/L (ref 38–126)
Bilirubin, Direct: 0.1 mg/dL (ref 0.0–0.2)
Indirect Bilirubin: 0.4 mg/dL (ref 0.3–0.9)
Total Bilirubin: 0.5 mg/dL (ref 0.3–1.2)
Total Protein: 7.4 g/dL (ref 6.5–8.1)

## 2022-10-18 LAB — CBC WITH DIFFERENTIAL/PLATELET
Abs Immature Granulocytes: 0.04 10*3/uL (ref 0.00–0.07)
Basophils Absolute: 0.1 10*3/uL (ref 0.0–0.1)
Basophils Relative: 1 %
Eosinophils Absolute: 0.1 10*3/uL (ref 0.0–0.5)
Eosinophils Relative: 2 %
HCT: 40.2 % (ref 36.0–46.0)
Hemoglobin: 13.4 g/dL (ref 12.0–15.0)
Immature Granulocytes: 1 %
Lymphocytes Relative: 39 %
Lymphs Abs: 2.3 10*3/uL (ref 0.7–4.0)
MCH: 30.7 pg (ref 26.0–34.0)
MCHC: 33.3 g/dL (ref 30.0–36.0)
MCV: 92.2 fL (ref 80.0–100.0)
Monocytes Absolute: 0.5 10*3/uL (ref 0.1–1.0)
Monocytes Relative: 8 %
Neutro Abs: 2.9 10*3/uL (ref 1.7–7.7)
Neutrophils Relative %: 49 %
Platelets: 380 10*3/uL (ref 150–400)
RBC: 4.36 MIL/uL (ref 3.87–5.11)
RDW: 14.1 % (ref 11.5–15.5)
WBC: 5.9 10*3/uL (ref 4.0–10.5)
nRBC: 0 % (ref 0.0–0.2)

## 2022-10-18 LAB — BASIC METABOLIC PANEL
Anion gap: 7 (ref 5–15)
BUN: 11 mg/dL (ref 6–20)
CO2: 30 mmol/L (ref 22–32)
Calcium: 9.6 mg/dL (ref 8.9–10.3)
Chloride: 104 mmol/L (ref 98–111)
Creatinine, Ser: 0.8 mg/dL (ref 0.44–1.00)
GFR, Estimated: >60 mL/min (ref >60)
Glucose, Bld: 92 mg/dL (ref 70–99)
Potassium: 4.1 mmol/L (ref 3.5–5.1)
Sodium: 141 mmol/L (ref 135–145)

## 2022-10-18 LAB — LIPID PANEL
Cholesterol: 172 mg/dL (ref 0–200)
HDL: 63 mg/dL (ref >40)
LDL Cholesterol: 90 mg/dL (ref 0–99)
Total CHOL/HDL Ratio: 2.7 RATIO
Triglycerides: 94 mg/dL (ref <150)
VLDL: 19 mg/dL (ref 0–40)

## 2022-11-13 ENCOUNTER — Encounter: Payer: Self-pay | Admitting: Family Medicine

## 2022-11-13 ENCOUNTER — Ambulatory Visit (INDEPENDENT_AMBULATORY_CARE_PROVIDER_SITE_OTHER): Payer: 59 | Admitting: Family Medicine

## 2022-11-13 VITALS — BP 131/84 | HR 73 | Ht 63.0 in | Wt 170.0 lb

## 2022-11-13 DIAGNOSIS — I10 Essential (primary) hypertension: Secondary | ICD-10-CM

## 2022-11-13 DIAGNOSIS — F320 Major depressive disorder, single episode, mild: Secondary | ICD-10-CM

## 2022-11-13 DIAGNOSIS — R7301 Impaired fasting glucose: Secondary | ICD-10-CM | POA: Diagnosis not present

## 2022-11-13 DIAGNOSIS — E039 Hypothyroidism, unspecified: Secondary | ICD-10-CM

## 2022-11-13 DIAGNOSIS — F5101 Primary insomnia: Secondary | ICD-10-CM

## 2022-11-13 DIAGNOSIS — F339 Major depressive disorder, recurrent, unspecified: Secondary | ICD-10-CM

## 2022-11-13 MED ORDER — TRAZODONE HCL 50 MG PO TABS: 25.0000 mg | ORAL_TABLET | Freq: Every evening | ORAL | 3 refills | Status: DC | PRN

## 2022-11-13 MED ORDER — BUPROPION HCL ER (XL) 150 MG PO TB24: 150.0000 mg | ORAL_TABLET | Freq: Every day | ORAL | 2 refills | Status: DC

## 2022-11-13 NOTE — Patient Instructions (Signed)
It was pleasure meeting with you today. Please take medications as prescribed. Follow up with your primary health provider if any health concerns arises.

## 2022-11-13 NOTE — Progress Notes (Signed)
New Patient Office Visit   Subjective   Patient ID: Kirsten Walls, female    DOB: May 09, 1966  Age: 57 y.o. MRN: [id number]  CC:  Chief Complaint  Patient presents with   Establish Care   Insomnia    Patient complains of not being able to sleep through the night. States she goes to bed at 8pm and is up by 3am.     HPI Kirsten Walls 57 year old female, presents to establish care. She  has a past medical history of Arthritis, Asthma, Bronchitis, Chronic shoulder pain, Epistaxis, Hypertension, Insomnia, and Seizures ([place]).  Insomnia Primary symptoms: sleep disturbance, difficulty falling asleep, frequent awakening, premature morning awakening, malaise/fatigue, napping.   The current episode started more than one month. The problem occurs nightly. The problem has been rapidly worsening since onset. The symptoms are aggravated by family issues, anxiety and emotional upset. How many beverages per day that contain caffeine: 0 - 1.  The symptoms are relieved by darkened room, quiet environment and medication. Treatments tried: OTC melatonin. The treatment provided no relief. Typical bedtime:  8-10 P.M..  How long after going to bed to you fall asleep: over an hour.   PMH includes: hypertension, depression, family stress or anxiety.         11/13/2022   10:49 AM  GAD 7 : Generalized Anxiety Score  Nervous, Anxious, on Edge 0  Control/stop worrying 0  Worry too much - different things 0  Trouble relaxing 3  Restless 3  Easily annoyed or irritable 2  Afraid - awful might happen 0  Total GAD 7 Score 8  Anxiety Difficulty Extremely difficult     Flowsheet Row Office Visit from 11/13/2022 in [facility]  PHQ-9 Total Score 11            Outpatient Encounter Medications as of 11/13/2022  Medication Sig   albuterol (VENTOLIN HFA) 108 (90 Base) MCG/ACT inhaler Inhale 2 puffs into the lungs every 6 (six) hours as needed. For shortness of  breath/asthma/bronchitis   buPROPion (WELLBUTRIN XL) 150 MG 24 hr tablet Take 1 tablet (150 mg total) by mouth daily.   clobetasol (TEMOVATE) 0.05 % external solution Apply topically.   EPINEPHrine (EPIPEN 2-PAK) 0.3 mg/0.3 mL IJ SOAJ injection Inject 0.3 mLs (0.3 mg total) into the muscle as needed for anaphylaxis.   Multiple Vitamin (MULTIVITAMIN PO) Take 1 tablet by mouth daily.   pantoprazole (PROTONIX) 40 MG tablet Take 40 mg by mouth 2 (two) times daily.   traZODone (DESYREL) 50 MG tablet Take 0.5 tablets (25 mg total) by mouth at bedtime as needed for sleep.   Vitamin D, Ergocalciferol, (DRISDOL) 1.25 MG (50000 UNIT) CAPS capsule Take by mouth.   [DISCONTINUED] IBU 800 MG tablet Take by mouth at bedtime.   amLODipine (NORVASC) 5 MG tablet Take 1 tablet (5 mg total) by mouth daily.   baclofen (LIORESAL) 10 MG tablet Take 10 mg by mouth 2 (two) times daily as needed for muscle spasms.    chlorpheniramine-HYDROcodone (TUSSIONEX PENNKINETIC ER) 10-8 MG/5ML SUER Take 5 mLs by mouth every 12 (twelve) hours as needed for cough.   HYDROcodone-acetaminophen (NORCO/VICODIN) 5-325 MG tablet Take 1 tablet by mouth every 6 (six) hours as needed.   Hydrocortisone, Perianal, 1 % CREA Apply topically.   hydrOXYzine (VISTARIL) 25 MG capsule Take 25 mg by mouth 3 (three) times daily.   montelukast (SINGULAIR) 10 MG tablet Take 10 mg by mouth daily.   predniSONE (DELTASONE) 20  MG tablet Take 20 mg by mouth daily.   topiramate (TOPAMAX) 25 MG tablet Take 100 mg by mouth daily.   No facility-administered encounter medications on file as of 11/13/2022.    Past Surgical History:  Procedure Laterality Date   ABDOMINAL HYSTERECTOMY     BUNIONECTOMY  both big toe   CESAREAN SECTION  x2   COLONOSCOPY N/A 08/18/2015   Procedure: COLONOSCOPY;  Surgeon: Daneil Dolin, MD;  [facility];  Service: Endoscopy;  Laterality: N/A;  1000   FOOT SURGERY     KNEE SURGERY     SHOULDER ARTHROSCOPY     TUMOR  REMOVAL  left knee cap    Review of Systems  Constitutional:  Positive for malaise/fatigue. Negative for chills and fever.  Respiratory:  Negative for shortness of breath.   Cardiovascular:  Negative for chest pain.  Neurological:  Negative for dizziness and headaches.  Psychiatric/Behavioral:  Positive for depression and sleep disturbance. The patient has insomnia.       Objective    BP 131/84   Pulse 73   Ht 5' 3"$  (1.6 m)   Wt 170 lb (77.1 kg)   SpO2 92%   BMI 30.11 kg/m   Physical Exam Constitutional:      Appearance: Normal appearance.  Cardiovascular:     Rate and Rhythm: Normal rate.     Pulses: Normal pulses.  Pulmonary:     Effort: Pulmonary effort is normal.     Breath sounds: Normal breath sounds.  Musculoskeletal:        General: Normal range of motion.  Skin:    General: Skin is warm and dry.     Capillary Refill: Capillary refill takes less than 2 seconds.  Neurological:     General: No focal deficit present.     Mental Status: She is alert.  Psychiatric:        Mood and Affect: Mood normal.       Assessment & Plan:  Primary insomnia Assessment & Plan: Prescribed Trazodone 25 mg as initial therapy  Explained to go to bed at the same time each night and get up at the same time each morning, including on the weekends. Make sure your bedroom is quiet, dark, relaxing, and at a comfortable temperature. Remove electronic devices, such as TVs, computers, and smart phones, from the bedroom.  Follow up 4-6 weeks   Orders: -     traZODone HCl; Take 0.5 tablets (25 mg total) by mouth at bedtime as needed for sleep.  Dispense: 30 tablet; Refill: 3  Primary hypertension -     Microalbumin / creatinine urine ratio  IFG (impaired fasting glucose) -     Hemoglobin A1c  Hypothyroidism, unspecified type -     TSH + free T4  Depression, major, single episode, mild (HCC) -     buPROPion HCl ER (XL); Take 1 tablet (150 mg total) by mouth daily.  Dispense: 30  tablet; Refill: 2  Depression, recurrent [facility]) Assessment & Plan: [place] Office Visit from 11/13/2022 in [facility]  PHQ-9 Total Score 11     Prescribed Wellbutrin 150 mg daily. Discussed non pharmacological interventions such as options speaking with a therapist, support system, diet, exercise, sleep. Follow up in 4- 6 weeks or sooner if needed. Patient verbalizes understanding regarding plan of care and all questions answered.      Return in about 6 weeks (around 12/25/2022) for Depression.   Jerene Yeager [employer],  FNP

## 2022-11-13 NOTE — Assessment & Plan Note (Signed)
Prescribed Trazodone 25 mg as initial therapy  Explained to go to bed at the same time each night and get up at the same time each morning, including on the weekends. Make sure your bedroom is quiet, dark, relaxing, and at a comfortable temperature. Remove electronic devices, such as TVs, computers, and smart phones, from the bedroom.  Follow up 4-6 weeks

## 2022-11-13 NOTE — Assessment & Plan Note (Addendum)
[place] Office Visit from 11/13/2022 in [facility]  PHQ-9 Total Score 11     Prescribed Wellbutrin 150 mg daily. Discussed non pharmacological interventions such as options speaking with a therapist, support system, diet, exercise, sleep. Follow up in 4- 6 weeks or sooner if needed. Patient verbalizes understanding regarding plan of care and all questions answered.

## 2022-11-15 LAB — HEMOGLOBIN A1C
Est. average glucose Bld gHb Est-mCnc: 126 mg/dL
Hgb A1c MFr Bld: 6 % — ABNORMAL HIGH (ref 4.8–5.6)

## 2022-11-15 LAB — MICROALBUMIN / CREATININE URINE RATIO
Creatinine, Urine: 117.8 mg/dL
Microalb/Creat Ratio: 5 mg/g creat (ref 0–29)
Microalbumin, Urine: 5.4 ug/mL

## 2022-11-15 LAB — TSH+FREE T4
Free T4: 0.98 ng/dL (ref 0.82–1.77)
TSH: 1.12 u[IU]/mL (ref 0.450–4.500)

## 2022-11-17 NOTE — Progress Notes (Signed)
Please inform patient, Hemoglobin A 1C 6.0 indicated pre-diabetes  Advise to follow a DASH diet which includes vegetables,fruits,whole grains, fat free or low fat diary,fish,poultry,beans,nuts and seeds,vegetable oils. Find an activity that you will enjoyandstart to be active at least 5 days a week for 30 minutes each day.  Follow up in 3 months in regards to pre-diabetes management

## 2022-12-18 ENCOUNTER — Ambulatory Visit (INDEPENDENT_AMBULATORY_CARE_PROVIDER_SITE_OTHER): Payer: 59 | Admitting: Internal Medicine

## 2022-12-18 ENCOUNTER — Encounter: Payer: Self-pay | Admitting: Internal Medicine

## 2022-12-18 ENCOUNTER — Telehealth: Payer: 59 | Admitting: Family Medicine

## 2022-12-18 DIAGNOSIS — Z0000 Encounter for general adult medical examination without abnormal findings: Secondary | ICD-10-CM

## 2022-12-18 NOTE — Patient Instructions (Addendum)
  Ms. Dusseault , Thank you for taking time to come for your Medicare Wellness Visit. I appreciate your ongoing commitment to your health goals. Please review the following plan we discussed and let me know if I can assist you in the future.   These are the goals we discussed: Patient is working on enrolling in a GED course.   This is a list of the screening recommended for you and due dates:  Health Maintenance  Topic Date Due   DTaP/Tdap/Td vaccine (1 - Tdap) Never done   Zoster (Shingles) Vaccine (1 of 2) Never done   COVID-19 Vaccine (2 - Pfizer risk series) 09/16/2020   Flu Shot  04/19/2023   Medicare Annual Wellness Visit  12/18/2023   Mammogram  07/24/2024   Colon Cancer Screening  08/17/2025   Hepatitis C Screening: USPSTF Recommendation to screen - Ages 18-79 yo.  Completed   HIV Screening  Completed   HPV Vaccine  Aged Out   Pap Smear  Discontinued

## 2022-12-18 NOTE — Progress Notes (Signed)
Subjective:  This is a telephone encounter between Kirsten Walls and Kirsten Walls on 12/18/2022 for AWV. The visit was conducted with the patient located at home and Kirsten Walls at [facility]. The patient's identity was confirmed using their DOB and current address. The  patient  has consented to being evaluated through a telephone encounter and understands the associated risks (an examination cannot be done and the patient may need to come in for an appointment) / benefits (allows the patient to remain at home, decreasing exposure to coronavirus).     Kirsten Walls is a 57 y.o. female who presents for Medicare Annual (Subsequent) preventive examination.  Review of Systems    Review of Systems  All other systems reviewed and are negative.    Objective:    There were no vitals filed for this visit. There is no height or weight on file to calculate BMI.     12/18/2022    1:05 PM 03/28/2022    7:58 AM 04/19/2021   10:38 AM 09/12/2020    8:35 AM 04/15/2020   10:05 PM 03/22/2020   11:03 PM 03/22/2020   10:35 AM  Advanced Directives  Does Patient Have a Medical Advance Directive? No No No No No No No  Would patient like information on creating a medical advance directive? No - Patient declined No - Patient declined   No - Patient declined No - Patient declined     Current Medications (verified) Outpatient Encounter Medications as of 12/18/2022  Medication Sig   albuterol (VENTOLIN HFA) 108 (90 Base) MCG/ACT inhaler Inhale 2 puffs into the lungs every 6 (six) hours as needed. For shortness of breath/asthma/bronchitis   amLODipine (NORVASC) 5 MG tablet Take 1 tablet (5 mg total) by mouth daily.   baclofen (LIORESAL) 10 MG tablet Take 10 mg by mouth 2 (two) times daily as needed for muscle spasms.    buPROPion (WELLBUTRIN XL) 150 MG 24 hr tablet Take 1 tablet (150 mg total) by mouth daily.   chlorpheniramine-HYDROcodone (TUSSIONEX PENNKINETIC ER) 10-8 MG/5ML SUER Take 5 mLs by mouth every 12  (twelve) hours as needed for cough.   clobetasol (TEMOVATE) 0.05 % external solution Apply topically.   EPINEPHrine (EPIPEN 2-PAK) 0.3 mg/0.3 mL IJ SOAJ injection Inject 0.3 mLs (0.3 mg total) into the muscle as needed for anaphylaxis.   HYDROcodone-acetaminophen (NORCO/VICODIN) 5-325 MG tablet Take 1 tablet by mouth every 6 (six) hours as needed.   Hydrocortisone, Perianal, 1 % CREA Apply topically.   hydrOXYzine (VISTARIL) 25 MG capsule Take 25 mg by mouth 3 (three) times daily.   montelukast (SINGULAIR) 10 MG tablet Take 10 mg by mouth daily.   Multiple Vitamin (MULTIVITAMIN PO) Take 1 tablet by mouth daily.   pantoprazole (PROTONIX) 40 MG tablet Take 40 mg by mouth 2 (two) times daily.   predniSONE (DELTASONE) 20 MG tablet Take 20 mg by mouth daily.   topiramate (TOPAMAX) 25 MG tablet Take 100 mg by mouth daily.   traZODone (DESYREL) 50 MG tablet Take 0.5 tablets (25 mg total) by mouth at bedtime as needed for sleep.   Vitamin D, Ergocalciferol, (DRISDOL) 1.25 MG (50000 UNIT) CAPS capsule Take by mouth.   No facility-administered encounter medications on file as of 12/18/2022.    Allergies (verified) Penicillins, Topiramate, Azithromycin, and Sulfamethoxazole-trimethoprim   History: Past Medical History:  Diagnosis Date   Arthritis    Asthma    Bronchitis    Chronic shoulder pain    Epistaxis    Hypertension  Insomnia    Seizures ([place])    Past Surgical History:  Procedure Laterality Date   ABDOMINAL HYSTERECTOMY     BUNIONECTOMY  both big toe   CESAREAN SECTION  x2   COLONOSCOPY N/A 08/18/2015   Procedure: COLONOSCOPY;  Surgeon: Daneil Dolin, MD;  [facility];  Service: Endoscopy;  Laterality: N/A;  1000   FOOT SURGERY     KNEE SURGERY     SHOULDER ARTHROSCOPY     TUMOR REMOVAL  left knee cap   Family History  Problem Relation Age of Onset   Hypertension Mother    Hyperlipidemia Mother    Diabetes Mother    Asthma Other    Colon cancer Neg Hx     Social History   Socioeconomic History   Marital status: Single    Spouse name: Not on file   Number of children: 3   Years of education: Not on file   Highest education level: Not on file  Occupational History   Not on file  Tobacco Use   Smoking status: Some Days    Years: 30    Types: Cigarettes   Smokeless tobacco: Never   Tobacco comments:    Only smokes when she drinks   Vaping Use   Vaping Use: Former  Substance and Sexual Activity   Alcohol use: Yes    Comment: social   Drug use: No   Sexual activity: Not Currently    Birth control/protection: Surgical    Comment: hyst  Other Topics Concern   Not on file  Social History Narrative   Not on file   Social Determinants of Health   Financial Resource Strain: Not on file  Food Insecurity: Not on file  Transportation Needs: Not on file  Physical Activity: Not on file  Stress: Not on file  Social Connections: Not on file    Tobacco Counseling Ready to quit: Not Answered Counseling given: Not Answered Tobacco comments: Only smokes when she drinks    Clinical Intake:  Pre-visit preparation completed: Yes  Pain : No/denies pain     Diabetes: No  How often do you need to have someone help you when you read instructions, pamphlets, or other written materials from your doctor or pharmacy?: 1 - Never What is the last grade level you completed in school?: 11th grade    Activities of Daily Living     No data to display          Patient Care Team: Del Eli Hose, FNP as PCP - General (Family Medicine) Gala Romney, Cristopher Estimable, MD as Consulting Physician (Gastroenterology)  Indicate any recent Medical Services you may have received from other than [facility] providers in the past year (date may be approximate).     Assessment:   This is a routine wellness examination for Kirsten Walls.  Hearing/Vision screen No results found.  Dietary issues and exercise activities discussed:     Goals Addressed    None    Depression Screen    12/18/2022    1:07 PM 11/13/2022   10:47 AM 04/01/2018    9:07 AM  PHQ 2/9 Scores  PHQ - 2 Score 0 4 0  PHQ- 9 Score  11     Fall Risk    12/18/2022    1:07 PM 11/13/2022   10:47 AM  Fall Risk   Falls in the past year? 0 1  Number falls in past yr:  0  Injury with Fall?  1  FALL RISK PREVENTION PERTAINING TO THE HOME:  Any stairs in or around the home? No  If so, are there any without handrails? No  Home free of loose throw rugs in walkways, pet beds, electrical cords, etc? Yes  Adequate lighting in your home to reduce risk of falls? Yes   ASSISTIVE DEVICES UTILIZED TO PREVENT FALLS:  Life alert? Yes  Use of a cane, walker or w/c? Yes  Grab bars in the bathroom? Yes  Shower chair or bench in shower? No  Elevated toilet seat or a handicapped toilet? No   Cognitive Function:        12/18/2022    1:07 PM  6CIT Screen  What Year? 0 points  What month? 0 points  What time? 0 points  Count back from 20 0 points  Months in reverse 4 points  Repeat phrase 0 points  Total Score 4 points    Immunizations Immunization History  Administered Date(s) Administered   PFIZER(Purple Top)SARS-COV-2 Vaccination 08/26/2020    TDAP status: Due, Education has been provided regarding the importance of this vaccine. Advised may receive this vaccine at local pharmacy or Health Dept. Aware to provide a copy of the vaccination record if obtained from local pharmacy or Health Dept. Verbalized acceptance and understanding.  Flu Vaccine status: Declined, Education has been provided regarding the importance of this vaccine but patient still declined. Advised may receive this vaccine at local pharmacy or Health Dept. Aware to provide a copy of the vaccination record if obtained from local pharmacy or Health Dept. Verbalized acceptance and understanding.  Covid-19 vaccine status: Information provided on how to obtain vaccines.    Qualifies for Shingles  Vaccine? Yes   Zostavax completed No   Shingrix Completed?: YesDates not recorded, patient will bring to next visit.  Screening Tests Health Maintenance  Topic Date Due   DTaP/Tdap/Td (1 - Tdap) Never done   Zoster Vaccines- Shingrix (1 of 2) Never done   Medicare Annual Wellness (AWV)  07/23/2018   COVID-19 Vaccine (2 - Pfizer risk series) 09/16/2020   INFLUENZA VACCINE  04/19/2023   MAMMOGRAM  07/24/2024   COLONOSCOPY (Pts 45-78yrs Insurance coverage will need to be confirmed)  08/17/2025   Hepatitis C Screening  Completed   HIV Screening  Completed   HPV VACCINES  Aged Out   PAP SMEAR-Modifier  Discontinued    Health Maintenance  Health Maintenance Due  Topic Date Due   DTaP/Tdap/Td (1 - Tdap) Never done   Zoster Vaccines- Shingrix (1 of 2) Never done   Medicare Annual Wellness (AWV)  07/23/2018   COVID-19 Vaccine (2 - Pfizer risk series) 09/16/2020    Colorectal cancer screening: Type of screening: Colonoscopy. Completed 08/18/2015. Repeat every 10 years  Mammogram status: Completed 07/24/2022. Repeat every year  Lung Cancer Screening: (Low Dose CT Chest recommended if Age 23-80 years, 30 pack-year currently smoking OR have quit w/in 15years.) does qualify.   Lung Cancer Screening Referral: Patient deferred.  Additional Screening:  Hepatitis C Screening: does not qualify; Completed 02/03/2022  Vision Screening: Recommended annual ophthalmology exams for early detection of glaucoma and other disorders of the eye. Is the patient up to date with their annual eye exam?  Yes  Who is the provider or what is the name of the office in which the patient attends annual eye exams? Dr.Shapiro  If pt is not established with a provider, would they like to be referred to a provider to establish care? No .   Dental  Screening: Recommended annual dental exams for proper oral hygiene  Community Resource Referral / Chronic Care Management: CRR required this visit?  No   CCM required  this visit?  No      Plan:     I have personally reviewed and noted the following in the patient's chart:   Medical and social history Use of alcohol, tobacco or illicit drugs  Current medications and supplements including opioid prescriptions. Patient is not currently taking opioid prescriptions. Functional ability and status Nutritional status Physical activity Advanced directives List of other physicians Hospitalizations, surgeries, and ER visits in previous 12 months Vitals Screenings to include cognitive, depression, and falls Referrals and appointments  In addition, I have reviewed and discussed with patient certain preventive protocols, quality metrics, and best practice recommendations. A written personalized care plan for preventive services as well as general preventive health recommendations were provided to patient.     Kirsten Dy, MD   12/18/2022

## 2022-12-25 ENCOUNTER — Encounter: Payer: Self-pay | Admitting: Family Medicine

## 2022-12-25 ENCOUNTER — Ambulatory Visit (INDEPENDENT_AMBULATORY_CARE_PROVIDER_SITE_OTHER): Payer: 59 | Admitting: Family Medicine

## 2022-12-25 VITALS — BP 143/87 | HR 79 | Ht 62.0 in | Wt 171.0 lb

## 2022-12-25 DIAGNOSIS — K5900 Constipation, unspecified: Secondary | ICD-10-CM | POA: Insufficient documentation

## 2022-12-25 DIAGNOSIS — L309 Dermatitis, unspecified: Secondary | ICD-10-CM

## 2022-12-25 DIAGNOSIS — F5101 Primary insomnia: Secondary | ICD-10-CM | POA: Diagnosis not present

## 2022-12-25 MED ORDER — TRIAMCINOLONE ACETONIDE 0.025 % EX OINT: 1.0000 | TOPICAL_OINTMENT | Freq: Two times a day (BID) | CUTANEOUS | 0 refills | Status: DC

## 2022-12-25 MED ORDER — TRAZODONE HCL 50 MG PO TABS: 50.0000 mg | ORAL_TABLET | Freq: Every evening | ORAL | 3 refills | Status: DC | PRN

## 2022-12-25 MED ORDER — POLYETHYLENE GLYCOL 3350 17 G PO PACK: 17.0000 g | PACK | Freq: Every day | ORAL | 0 refills | Status: DC

## 2022-12-25 NOTE — Progress Notes (Signed)
Patient Office Visit   Subjective   Patient ID: Kirsten Walls, female    DOB: 10/30/1965  Age: 57 y.o. MRN: [id number]  CC: No chief complaint on file.   HPI Kirsten Walls 57 year old female, presents to the clinic for constipation. She  has a past medical history of Arthritis, Asthma, Bronchitis, Chronic shoulder pain, Epistaxis, Hypertension, Insomnia, and Seizures.  Constipation The current episode started 2 weeks ago. The problem is unchanged. Her stool frequency is 0-1 time per day. The stool is described as oily and loose. The patient is not on a high fiber diet. She Does not exercise regularly. There has Not been adequate water intake. Pertinent negatives include no abdominal pain, back pain, bloating, hematochezia, hemorrhoids, melena, rectal pain or vomiting. Risk factors include recent dehydration and dietary change. She has tried stool softeners for the symptoms. The treatment provided mild relief.     Outpatient Encounter Medications as of 12/25/2022  Medication Sig   polyethylene glycol (MIRALAX) 17 g packet Take 17 g by mouth daily.   triamcinolone (KENALOG) 0.025 % ointment Apply 1 Application topically 2 (two) times daily.   albuterol (VENTOLIN HFA) 108 (90 Base) MCG/ACT inhaler Inhale 2 puffs into the lungs every 6 (six) hours as needed. For shortness of breath/asthma/bronchitis   amLODipine (NORVASC) 5 MG tablet Take 1 tablet (5 mg total) by mouth daily.   baclofen (LIORESAL) 10 MG tablet Take 10 mg by mouth 2 (two) times daily as needed for muscle spasms.    buPROPion (WELLBUTRIN XL) 150 MG 24 hr tablet Take 1 tablet (150 mg total) by mouth daily.   chlorpheniramine-HYDROcodone (TUSSIONEX PENNKINETIC ER) 10-8 MG/5ML SUER Take 5 mLs by mouth every 12 (twelve) hours as needed for cough.   clobetasol (TEMOVATE) 0.05 % external solution Apply topically.   EPINEPHrine (EPIPEN 2-PAK) 0.3 mg/0.3 mL IJ SOAJ injection Inject 0.3 mLs (0.3 mg total) into the muscle as needed  for anaphylaxis.   HYDROcodone-acetaminophen (NORCO/VICODIN) 5-325 MG tablet Take 1 tablet by mouth every 6 (six) hours as needed.   Hydrocortisone, Perianal, 1 % CREA Apply topically.   hydrOXYzine (VISTARIL) 25 MG capsule Take 25 mg by mouth 3 (three) times daily.   montelukast (SINGULAIR) 10 MG tablet Take 10 mg by mouth daily.   Multiple Vitamin (MULTIVITAMIN PO) Take 1 tablet by mouth daily.   pantoprazole (PROTONIX) 40 MG tablet Take 40 mg by mouth 2 (two) times daily.   predniSONE (DELTASONE) 20 MG tablet Take 20 mg by mouth daily.   topiramate (TOPAMAX) 25 MG tablet Take 100 mg by mouth daily.   traZODone (DESYREL) 50 MG tablet Take 1 tablet (50 mg total) by mouth at bedtime as needed for sleep.   Vitamin D, Ergocalciferol, (DRISDOL) 1.25 MG (50000 UNIT) CAPS capsule Take by mouth.   [DISCONTINUED] traZODone (DESYREL) 50 MG tablet Take 0.5 tablets (25 mg total) by mouth at bedtime as needed for sleep.   No facility-administered encounter medications on file as of 12/25/2022.    Past Surgical History:  Procedure Laterality Date   ABDOMINAL HYSTERECTOMY     BUNIONECTOMY  both big toe   CESAREAN SECTION  x2   COLONOSCOPY N/A 08/18/2015   Procedure: COLONOSCOPY;  Surgeon: Corbin Ade, MD;  Location: [facility];  Service: Endoscopy;  Laterality: N/A;  1000   FOOT SURGERY     KNEE SURGERY     SHOULDER ARTHROSCOPY     TUMOR REMOVAL  left knee cap  Review of Systems  Constitutional:  Negative for chills and fever.  HENT:  Negative for tinnitus.   Eyes:  Negative for blurred vision.  Respiratory:  Negative for shortness of breath.   Cardiovascular:  Negative for chest pain.  Gastrointestinal:  Positive for constipation. Negative for abdominal pain, diarrhea, heartburn, melena, nausea and vomiting.  Genitourinary:  Negative for dysuria.  Musculoskeletal:  Negative for myalgias.  Skin:  Positive for itching and rash.  Neurological:  Negative for dizziness and headaches.   Psychiatric/Behavioral:  Negative for depression. The patient is not nervous/anxious and does not have insomnia.       Objective    BP (!) 143/87   Pulse 79   Ht 5\' 2"  (1.575 m)   Wt 171 lb (77.6 kg)   SpO2 97%   BMI 31.28 kg/m   Physical Exam HENT:     Head: Normocephalic.     Left Ear: Tympanic membrane normal.  Cardiovascular:     Rate and Rhythm: Normal rate and regular rhythm.     Pulses: Normal pulses.     Heart sounds: Normal heart sounds.  Pulmonary:     Breath sounds: Normal breath sounds.  Abdominal:     General: Bowel sounds are normal.     Palpations: Abdomen is soft.     Tenderness: There is no abdominal tenderness. There is no right CVA tenderness or left CVA tenderness.  Musculoskeletal:        General: Normal range of motion.  Skin:    General: Skin is warm.     Findings: Rash present. No erythema, laceration or petechiae. Rash is urticarial. Rash is not crusting, nodular, purpuric or vesicular.          Comments: Dry and scaling   Neurological:     General: No focal deficit present.     Mental Status: She is alert.     Coordination: Coordination normal.     Gait: Gait normal.  Psychiatric:        Mood and Affect: Mood normal.       Assessment & Plan:  Constipation, unspecified constipation type Assessment & Plan: Miralax 17 g PRN Advise patient to consume rich fiber foods such as black beans, legumes, fruits, vegetables, whole grains, and nuts and seeds Increase fluid intake 8 glasses of water daily Follow up in 3-4 weeks if no improvement   Orders: -     Polyethylene Glycol 3350; Take 17 g by mouth daily.  Dispense: 14 each; Refill: 0  Dermatitis Assessment & Plan: Kenalog 0.025 % ointment Advise patient to apply cold , wet cloth or ice pack to the skin that itches, wear loose-fitting , cotton clothing, use fragrance-free lotions, soaps, and detergents to minimize irritation.   Orders: -     Triamcinolone Acetonide; Apply 1 Application  topically 2 (two) times daily.  Dispense: 30 g; Refill: 0  Primary insomnia -     traZODone HCl; Take 1 tablet (50 mg total) by mouth at bedtime as needed for sleep.  Dispense: 30 tablet; Refill: 3    Return in about 3 months (around 03/26/2023) for hypertension, chronic follow-up, Insomnia.   Cruzita Lederer Newman Nip, FNP

## 2022-12-25 NOTE — Assessment & Plan Note (Signed)
Kenalog 0.025 % ointment Advise patient to apply cold , wet cloth or ice pack to the skin that itches, wear loose-fitting , cotton clothing, use fragrance-free lotions, soaps, and detergents to minimize irritation.

## 2022-12-25 NOTE — Assessment & Plan Note (Addendum)
Miralax 17 g PRN Advise patient to consume rich fiber foods such as black beans, legumes, fruits, vegetables, whole grains, and nuts and seeds Increase fluid intake 8 glasses of water daily Follow up in 3-4 weeks if no improvement

## 2023-01-16 ENCOUNTER — Other Ambulatory Visit: Payer: Self-pay | Admitting: Family Medicine

## 2023-01-16 DIAGNOSIS — F320 Major depressive disorder, single episode, mild: Secondary | ICD-10-CM

## 2023-03-20 ENCOUNTER — Other Ambulatory Visit: Payer: Self-pay | Admitting: Family Medicine

## 2023-03-20 DIAGNOSIS — F320 Major depressive disorder, single episode, mild: Secondary | ICD-10-CM

## 2023-03-26 ENCOUNTER — Ambulatory Visit: Payer: 59 | Admitting: Family Medicine

## 2023-03-26 ENCOUNTER — Encounter: Payer: Self-pay | Admitting: Family Medicine

## 2023-03-26 VITALS — BP 125/82 | HR 59 | Ht 62.0 in | Wt 165.0 lb

## 2023-03-26 DIAGNOSIS — R140 Abdominal distension (gaseous): Secondary | ICD-10-CM

## 2023-03-26 DIAGNOSIS — K5900 Constipation, unspecified: Secondary | ICD-10-CM | POA: Diagnosis not present

## 2023-03-26 DIAGNOSIS — I10 Essential (primary) hypertension: Secondary | ICD-10-CM | POA: Diagnosis not present

## 2023-03-26 MED ORDER — TRIAMCINOLONE ACETONIDE 0.1 % EX CREA: 1.0000 | TOPICAL_CREAM | Freq: Two times a day (BID) | CUTANEOUS | 1 refills | Status: DC

## 2023-03-26 MED ORDER — SIMETHICONE 80 MG PO CHEW: 80.0000 mg | CHEWABLE_TABLET | Freq: Four times a day (QID) | ORAL | 3 refills | Status: DC | PRN

## 2023-03-26 NOTE — Patient Instructions (Addendum)
        Great to see you today.  I have refilled the medication(s) we provide.    Please come fasting for your next appointment for blood work!  - Please take medications as prescribed. - Follow up with your primary health provider if any health concerns arises. - If symptoms worsen please contact your primary care provider and/or visit the emergency department.

## 2023-03-26 NOTE — Assessment & Plan Note (Signed)
Constipation with abdominal bloating and flatulence Abdominal xray ordered to rule out obstruction- awaiting results will follow up. Gas 80 mg chewable tablets every 6 six hours Advise Increase Fiber Intake: Eat more fruits, vegetables, whole grains, and legumes. Foods high in fiber help to soften stool and make it easier to pass. Stay Hydrated: Drink plenty of water throughout the day. Aim for at least 8 glasses of water daily. Try to get at least 30 minutes of exercise most days of the week. Activities like walking, jogging, or yoga can help stimulate bowel movements.

## 2023-03-26 NOTE — Assessment & Plan Note (Signed)
Vitals:   03/26/23 1105  BP: 125/82   Blood pressure controlled in today's visit Continue Amlodipine 10 mg daily Continued discussion on DASH diet, low sodium diet and maintain a exercise routine for 150 minutes per week.

## 2023-03-26 NOTE — Progress Notes (Signed)
Patient Office Visit   Subjective   Patient ID: Kirsten Walls, female    DOB: 1966/01/31  Age: 57 y.o. MRN: [id number]  CC:  Chief Complaint  Patient presents with   Hypertension   Insomnia    Patient is here for f/u of HTN, insomnia, rash on her arms, and abdominal bloating.     HPI Kirsten Walls 57 year old presents to the clinic for HTN follow up and abdominal bloating with constipation for the past few weeks. She  has a past medical history of Arthritis, Asthma, Bronchitis, Chronic shoulder pain, Epistaxis, Hypertension, Insomnia, and Seizures (HCC).  Hypertension This is a chronic problem. The problem has been rapidly improving since onset. The problem is controlled. Pertinent negatives include no blurred vision, chest pain, headaches, peripheral edema or shortness of breath. Risk factors for coronary artery disease include family history, obesity and smoking/tobacco exposure. Past treatments include calcium channel blockers. The current treatment provides significant improvement. There is no history of kidney disease. There is no history of a thyroid problem.      Outpatient Encounter Medications as of 03/26/2023  Medication Sig   albuterol (VENTOLIN HFA) 108 (90 Base) MCG/ACT inhaler Inhale 2 puffs into the lungs every 6 (six) hours as needed. For shortness of breath/asthma/bronchitis   amLODipine (NORVASC) 10 MG tablet Take 10 mg by mouth daily.   baclofen (LIORESAL) 10 MG tablet Take 10 mg by mouth 2 (two) times daily as needed for muscle spasms.    celecoxib (CELEBREX) 200 MG capsule Take by mouth.   chlorpheniramine-HYDROcodone (TUSSIONEX PENNKINETIC ER) 10-8 MG/5ML SUER Take 5 mLs by mouth every 12 (twelve) hours as needed for cough.   Cholecalciferol 125 MCG (5000 UT) capsule Take by mouth.   clobetasol (TEMOVATE) 0.05 % external solution Apply topically.   EPINEPHrine (EPIPEN 2-PAK) 0.3 mg/0.3 mL IJ SOAJ injection Inject 0.3 mLs (0.3 mg total) into the muscle as  needed for anaphylaxis.   esomeprazole (NEXIUM) 40 MG capsule Take by mouth.   HYDROcodone-acetaminophen (NORCO/VICODIN) 5-325 MG tablet Take 1 tablet by mouth every 6 (six) hours as needed.   Hydrocortisone, Perianal, 1 % CREA Apply topically.   hydrOXYzine (VISTARIL) 25 MG capsule Take 25 mg by mouth 3 (three) times daily.   montelukast (SINGULAIR) 10 MG tablet Take 10 mg by mouth daily.   Multiple Vitamin (MULTIVITAMIN PO) Take 1 tablet by mouth daily.   oxymetazoline (NASAL RELIEF) 0.05 % nasal spray 1 spray 2 (two) times a day.   pantoprazole (PROTONIX) 40 MG tablet Take 40 mg by mouth 2 (two) times daily.   polyethylene glycol (MIRALAX) 17 g packet Take 17 g by mouth daily.   predniSONE (DELTASONE) 20 MG tablet Take 20 mg by mouth daily.   simethicone (GAS-X) 80 MG chewable tablet Chew 1 tablet (80 mg total) by mouth every 6 (six) hours as needed for flatulence.   topiramate (TOPAMAX) 25 MG tablet Take 100 mg by mouth daily.   traZODone (DESYREL) 50 MG tablet Take 1 tablet (50 mg total) by mouth at bedtime as needed for sleep.   triamcinolone cream (KENALOG) 0.1 % Apply 1 Application topically 2 (two) times daily.   Vitamin D, Ergocalciferol, (DRISDOL) 1.25 MG (50000 UNIT) CAPS capsule Take by mouth.   [DISCONTINUED] buPROPion (WELLBUTRIN XL) 150 MG 24 hr tablet TAKE ONE TABLET BY MOUTH ONCE DAILY.   [DISCONTINUED] triamcinolone (KENALOG) 0.025 % ointment Apply 1 Application topically 2 (two) times daily.   [DISCONTINUED] amLODipine (NORVASC) 5 MG tablet  Take 1 tablet (5 mg total) by mouth daily.   No facility-administered encounter medications on file as of 03/26/2023.    Past Surgical History:  Procedure Laterality Date   ABDOMINAL HYSTERECTOMY     BUNIONECTOMY  both big toe   CESAREAN SECTION  x2   COLONOSCOPY N/A 08/18/2015   Procedure: COLONOSCOPY;  Surgeon: Corbin Ade, MD;  Location: [facility];  Service: Endoscopy;  Laterality: N/A;  1000   FOOT SURGERY     KNEE  SURGERY     SHOULDER ARTHROSCOPY     TUMOR REMOVAL  left knee cap    Review of Systems  Constitutional:  Negative for chills and fever.  Eyes:  Negative for blurred vision.  Respiratory:  Negative for shortness of breath.   Cardiovascular:  Negative for chest pain.  Gastrointestinal:  Positive for constipation. Negative for abdominal pain, blood in stool, diarrhea, heartburn, melena, nausea and vomiting.  Genitourinary:  Negative for dysuria.  Musculoskeletal:  Negative for myalgias.  Neurological:  Negative for headaches.      Objective    BP 125/82   Pulse (!) 59   Ht 5\' 2"  (1.575 m)   Wt 165 lb (74.8 kg)   SpO2 92%   BMI 30.18 kg/m   Physical Exam Vitals reviewed.  Constitutional:      General: She is not in acute distress.    Appearance: Normal appearance. She is not ill-appearing, toxic-appearing or diaphoretic.  HENT:     Head: Normocephalic.  Eyes:     General:        Right eye: No discharge.        Left eye: No discharge.     Conjunctiva/sclera: Conjunctivae normal.  Cardiovascular:     Rate and Rhythm: Normal rate.     Pulses: Normal pulses.     Heart sounds: Normal heart sounds.  Pulmonary:     Effort: Pulmonary effort is normal. No respiratory distress.     Breath sounds: Normal breath sounds.  Abdominal:     General: Bowel sounds are normal. There is no distension.     Palpations: Abdomen is soft. There is no mass.     Tenderness: There is no abdominal tenderness. There is no guarding.     Hernia: No hernia is present.  Musculoskeletal:        General: Normal range of motion.     Cervical back: Normal range of motion.  Skin:    General: Skin is warm and dry.     Capillary Refill: Capillary refill takes less than 2 seconds.  Neurological:     General: No focal deficit present.     Mental Status: She is alert and oriented to person, place, and time.     Coordination: Coordination normal.     Gait: Gait normal.  Psychiatric:        Mood and  Affect: Mood normal.        Behavior: Behavior normal.       Assessment & Plan:  Abdominal bloating -     Simethicone; Chew 1 tablet (80 mg total) by mouth every 6 (six) hours as needed for flatulence.  Dispense: 30 tablet; Refill: 3 -     DG Abd 1 View; Future  Primary hypertension Assessment & Plan: Vitals:   03/26/23 1105  BP: 125/82   Blood pressure controlled in today's visit Continue Amlodipine 10 mg daily Continued discussion on DASH diet, low sodium diet and maintain a exercise routine for 150  minutes per week.    Constipation, unspecified constipation type Assessment & Plan: Constipation with abdominal bloating and flatulence Abdominal xray ordered to rule out obstruction- awaiting results will follow up. Gas 80 mg chewable tablets every 6 six hours Advise Increase Fiber Intake: Eat more fruits, vegetables, whole grains, and legumes. Foods high in fiber help to soften stool and make it easier to pass. Stay Hydrated: Drink plenty of water throughout the day. Aim for at least 8 glasses of water daily. Try to get at least 30 minutes of exercise most days of the week. Activities like walking, jogging, or yoga can help stimulate bowel movements.    Other orders -     Triamcinolone Acetonide; Apply 1 Application topically 2 (two) times daily.  Dispense: 60 g; Refill: 1    Return in about 3 months (around 06/26/2023) for pre diabetes , routine labs, hypertension.   Cruzita Lederer Newman Nip, FNP

## 2023-03-30 ENCOUNTER — Ambulatory Visit (HOSPITAL_COMMUNITY)
Admission: RE | Admit: 2023-03-30 | Discharge: 2023-03-30 | Disposition: A | Discharge status: Home / Self Care | Payer: 59 | Source: Ambulatory Visit | Attending: Family Medicine | Admitting: Family Medicine

## 2023-03-30 DIAGNOSIS — R140 Abdominal distension (gaseous): Secondary | ICD-10-CM | POA: Insufficient documentation

## 2023-04-08 ENCOUNTER — Other Ambulatory Visit: Payer: Self-pay | Admitting: Family Medicine

## 2023-04-08 DIAGNOSIS — K5909 Other constipation: Secondary | ICD-10-CM

## 2023-04-11 ENCOUNTER — Telehealth: Payer: Self-pay | Admitting: Family Medicine

## 2023-04-11 NOTE — Telephone Encounter (Signed)
Pt called in wants a call back.  Wants to know why she needs a colonoscopy. Also wants Xray results.

## 2023-04-12 ENCOUNTER — Other Ambulatory Visit: Payer: Self-pay | Admitting: Family Medicine

## 2023-04-12 MED ORDER — FIBERCON 625 MG PO TABS: 625.0000 mg | ORAL_TABLET | Freq: Every day | ORAL | 1 refills | Status: AC

## 2023-04-12 NOTE — Telephone Encounter (Signed)
Please inform patient,  Colonoscopy is to screen for colon cancer  Abdominal xray showed: Mild to moderate colonic stool burden diffusely throughout the colon:  Right kidney shows small kidney stones, but they are not blocking anything and are about the size of a small grain of rice.  Plan of care will include : Advise patient to increase water intake between 2-3 L a day. Limit intake of caffeine and sugary drinks.  Referral placed to GI but she refused . I placed the referral for chronic constipation management, I sent in Fibercon 625 mg tablet once a day which can help for constipation relief   Lifestyle changes for constipation: Advise Increase Fiber Intake: Eat more fruits, vegetables, whole grains, and legumes. Foods high in fiber help to soften stool and make it easier to pass. Stay Hydrated: Drink plenty of water throughout the day. Aim for at least 8 glasses of water daily. Try to get at least 30 minutes of exercise most days of the week. Activities like walking, jogging, or yoga can help stimulate bowel movements.

## 2023-05-01 ENCOUNTER — Other Ambulatory Visit: Payer: Self-pay | Admitting: Family Medicine

## 2023-05-01 DIAGNOSIS — K5900 Constipation, unspecified: Secondary | ICD-10-CM

## 2023-05-15 ENCOUNTER — Other Ambulatory Visit: Payer: Self-pay

## 2023-05-15 ENCOUNTER — Telehealth: Payer: Self-pay | Admitting: Family Medicine

## 2023-05-15 DIAGNOSIS — R55 Syncope and collapse: Secondary | ICD-10-CM

## 2023-05-15 MED ORDER — ALBUTEROL SULFATE HFA 108 (90 BASE) MCG/ACT IN AERS: 2.0000 | INHALATION_SPRAY | Freq: Four times a day (QID) | RESPIRATORY_TRACT | 2 refills | Status: DC | PRN

## 2023-05-15 NOTE — Telephone Encounter (Signed)
Rx has been sent  

## 2023-05-15 NOTE — Telephone Encounter (Signed)
Pt LVM wanting a refill on inhaler. Please advise Thank you

## 2023-06-07 ENCOUNTER — Other Ambulatory Visit: Payer: Self-pay | Admitting: Family Medicine

## 2023-06-07 DIAGNOSIS — F5101 Primary insomnia: Secondary | ICD-10-CM

## 2023-06-26 ENCOUNTER — Encounter: Payer: Self-pay | Admitting: Family Medicine

## 2023-06-26 ENCOUNTER — Ambulatory Visit: Payer: 59 | Admitting: Family Medicine

## 2023-06-26 VITALS — BP 124/77 | HR 63 | Ht 63.0 in | Wt 161.0 lb

## 2023-06-26 DIAGNOSIS — Z23 Encounter for immunization: Secondary | ICD-10-CM | POA: Diagnosis not present

## 2023-06-26 DIAGNOSIS — I10 Essential (primary) hypertension: Secondary | ICD-10-CM | POA: Diagnosis not present

## 2023-06-26 DIAGNOSIS — R7303 Prediabetes: Secondary | ICD-10-CM | POA: Diagnosis not present

## 2023-06-26 DIAGNOSIS — R55 Syncope and collapse: Secondary | ICD-10-CM | POA: Diagnosis not present

## 2023-06-26 DIAGNOSIS — F5101 Primary insomnia: Secondary | ICD-10-CM

## 2023-06-26 MED ORDER — MIRTAZAPINE 7.5 MG PO TABS: 7.5000 mg | ORAL_TABLET | Freq: Every day | ORAL | 2 refills | Status: DC

## 2023-06-26 MED ORDER — ALBUTEROL SULFATE HFA 108 (90 BASE) MCG/ACT IN AERS
2.0000 | INHALATION_SPRAY | Freq: Four times a day (QID) | RESPIRATORY_TRACT | 4 refills | Status: DC | PRN
Start: 2023-06-26 — End: 2023-10-22

## 2023-06-26 NOTE — Assessment & Plan Note (Signed)
Vitals:   06/26/23 1051  BP: 124/77  Blood pressure controlled, Labs ordered Continue Amlodipine 10 mg daily. Explained non pharmacological interventions such as low salt, DASH diet discussed. Educated on stress reduction and physical activity minimum 150 minutes per week. Discussed signs and symptoms of major cardiovascular event and need to present to the ED.  Patient verbalizes understanding regarding plan of care and all questions answered.

## 2023-06-26 NOTE — Assessment & Plan Note (Signed)
Last reading A1c 6.0 Hemoglobin A1c ordered today Discussed diet plan: A Sample Day of Eating for Diabetes or Prediabetes:  Breakfast: Oatmeal with a handful of berries and a sprinkle of chia seeds, paired with a boiled egg.  Lunch: Grilled chicken breast on a bed of spinach and kale, topped with avocado, a drizzle of olive oil, and a slice of whole grain bread.  Snack: A handful of almonds or a small apple with peanut butter.  Dinner: Baked salmon with roasted broccoli and quinoa.  Snack (if needed): A piece of cheese or a small bowl of Greek yogurt.

## 2023-06-26 NOTE — Progress Notes (Signed)
Patient Office Visit   Subjective   Patient ID: Kirsten Walls, female    DOB: 1965/12/10  Age: 57 y.o. MRN: [id number]  CC:  Chief Complaint  Patient presents with   Follow-up    Patient is here for chronic f/u. States that her trazodone only helps her sleep for a few hours.     HPI Kirsten Walls 57 year old female, presents to the clinic for HTN follow up and insomnia She  has a past medical history of Arthritis, Asthma, Bronchitis, Chronic shoulder pain, Epistaxis, Hypertension, Insomnia, and Seizures (HCC).For the details of today's visit, please refer to assessment and plan.   HPI    Outpatient Encounter Medications as of 06/26/2023  Medication Sig   amLODipine (NORVASC) 10 MG tablet Take 10 mg by mouth daily.   baclofen (LIORESAL) 10 MG tablet Take 10 mg by mouth 2 (two) times daily as needed for muscle spasms.    celecoxib (CELEBREX) 200 MG capsule Take by mouth.   chlorpheniramine-HYDROcodone (TUSSIONEX PENNKINETIC ER) 10-8 MG/5ML SUER Take 5 mLs by mouth every 12 (twelve) hours as needed for cough.   Cholecalciferol 125 MCG (5000 UT) capsule Take by mouth.   clobetasol (TEMOVATE) 0.05 % external solution Apply topically.   EPINEPHrine (EPIPEN 2-PAK) 0.3 mg/0.3 mL IJ SOAJ injection Inject 0.3 mLs (0.3 mg total) into the muscle as needed for anaphylaxis.   esomeprazole (NEXIUM) 40 MG capsule Take by mouth.   GOODSENSE CLEARLAX 17 GM/SCOOP powder MIX 1 CAPFUL (17 GRAMS) WITH 8OZ OF WATER OR JUICE DAILY.   HYDROcodone-acetaminophen (NORCO/VICODIN) 5-325 MG tablet Take 1 tablet by mouth every 6 (six) hours as needed.   Hydrocortisone, Perianal, 1 % CREA Apply topically.   hydrOXYzine (VISTARIL) 25 MG capsule Take 25 mg by mouth 3 (three) times daily.   mirtazapine (REMERON) 7.5 MG tablet Take 1 tablet (7.5 mg total) by mouth at bedtime.   montelukast (SINGULAIR) 10 MG tablet Take 10 mg by mouth daily.   Multiple Vitamin (MULTIVITAMIN PO) Take 1 tablet by mouth daily.    oxymetazoline (NASAL RELIEF) 0.05 % nasal spray 1 spray 2 (two) times a day.   pantoprazole (PROTONIX) 40 MG tablet Take 40 mg by mouth 2 (two) times daily.   polycarbophil (FIBERCON) 625 MG tablet Take 1 tablet (625 mg total) by mouth daily.   predniSONE (DELTASONE) 20 MG tablet Take 20 mg by mouth daily.   simethicone (GAS-X) 80 MG chewable tablet Chew 1 tablet (80 mg total) by mouth every 6 (six) hours as needed for flatulence.   topiramate (TOPAMAX) 25 MG tablet Take 100 mg by mouth daily.   triamcinolone cream (KENALOG) 0.1 % Apply 1 Application topically 2 (two) times daily.   Vitamin D, Ergocalciferol, (DRISDOL) 1.25 MG (50000 UNIT) CAPS capsule Take by mouth.   [DISCONTINUED] traZODone (DESYREL) 50 MG tablet TAKE 1 TABLET BY MOUTH DAILY AT BEDTIME AS NEEDED FOR SLEEP   albuterol (VENTOLIN HFA) 108 (90 Base) MCG/ACT inhaler Inhale 2 puffs into the lungs every 6 (six) hours as needed. For shortness of breath/asthma/bronchitis   [DISCONTINUED] albuterol (VENTOLIN HFA) 108 (90 Base) MCG/ACT inhaler Inhale 2 puffs into the lungs every 6 (six) hours as needed. For shortness of breath/asthma/bronchitis   No facility-administered encounter medications on file as of 06/26/2023.    Past Surgical History:  Procedure Laterality Date   ABDOMINAL HYSTERECTOMY     BUNIONECTOMY  both big toe   CESAREAN SECTION  x2   COLONOSCOPY N/A 08/18/2015  Procedure: COLONOSCOPY;  Surgeon: Corbin Ade, MD;  Location: [facility];  Service: Endoscopy;  Laterality: N/A;  1000   FOOT SURGERY     KNEE SURGERY     SHOULDER ARTHROSCOPY     TUMOR REMOVAL  left knee cap    Review of Systems  Constitutional:  Negative for chills and fever.  Eyes:  Negative for blurred vision.  Respiratory:  Negative for shortness of breath.   Cardiovascular:  Negative for chest pain.  Genitourinary:  Negative for dysuria.  Neurological:  Negative for dizziness and headaches.  Psychiatric/Behavioral:  The patient does not  have insomnia.       Objective    BP 124/77   Pulse 63   Ht 5\' 3"  (1.6 m)   Wt 161 lb (73 kg)   SpO2 93%   BMI 28.52 kg/m   Physical Exam Vitals reviewed.  Constitutional:      General: She is not in acute distress.    Appearance: Normal appearance. She is not ill-appearing, toxic-appearing or diaphoretic.  HENT:     Head: Normocephalic.  Eyes:     General:        Right eye: No discharge.        Left eye: No discharge.     Conjunctiva/sclera: Conjunctivae normal.  Cardiovascular:     Rate and Rhythm: Normal rate.     Pulses: Normal pulses.     Heart sounds: Normal heart sounds.  Pulmonary:     Effort: Pulmonary effort is normal. No respiratory distress.     Breath sounds: Normal breath sounds.  Musculoskeletal:        General: Normal range of motion.     Cervical back: Normal range of motion.  Skin:    General: Skin is warm and dry.     Capillary Refill: Capillary refill takes less than 2 seconds.  Neurological:     Mental Status: She is alert.     Coordination: Coordination normal.     Gait: Gait normal.  Psychiatric:        Mood and Affect: Mood normal.       Assessment & Plan:  Prediabetes Assessment & Plan: Last reading A1c 6.0 Hemoglobin A1c ordered today Discussed diet plan: A Sample Day of Eating for Diabetes or Prediabetes:  Breakfast: Oatmeal with a handful of berries and a sprinkle of chia seeds, paired with a boiled egg.  Lunch: Grilled chicken breast on a bed of spinach and kale, topped with avocado, a drizzle of olive oil, and a slice of whole grain bread.  Snack: A handful of almonds or a small apple with peanut butter.  Dinner: Baked salmon with roasted broccoli and quinoa.  Snack (if needed): A piece of cheese or a small bowl of Greek yogurt.   Orders: -     Hemoglobin A1c  Encounter for immunization -     Flu Vaccine Trivalent High Dose (Fluad)  Primary hypertension Assessment & Plan: Vitals:   06/26/23 1051  BP: 124/77   Blood pressure controlled, Labs ordered Continue Amlodipine 10 mg daily. Explained non pharmacological interventions such as low salt, DASH diet discussed. Educated on stress reduction and physical activity minimum 150 minutes per week. Discussed signs and symptoms of major cardiovascular event and need to present to the ED.  Patient verbalizes understanding regarding plan of care and all questions answered.   Orders: -     BMP8+eGFR -     CBC with Differential/Platelet -  Lipid panel  Syncope, unspecified syncope type -     Albuterol Sulfate HFA; Inhale 2 puffs into the lungs every 6 (six) hours as needed. For shortness of breath/asthma/bronchitis  Dispense: 1 each; Refill: 4  Primary insomnia Assessment & Plan: Patient stop taking Trazodone 50 mg Trial on Remeron 7.5 mg prn at bedtime Continued discussion on being consistent with same sleep schedule, Avoid large meals, caffeine, and alcohol before bedtime.    Other orders -     Mirtazapine; Take 1 tablet (7.5 mg total) by mouth at bedtime.  Dispense: 30 tablet; Refill: 2    Return in about 4 months (around 10/27/2023), or if symptoms worsen or fail to improve, for chronic follow-up.   Cruzita Lederer Newman Nip, FNP

## 2023-06-26 NOTE — Assessment & Plan Note (Signed)
Patient stop taking Trazodone 50 mg Trial on Remeron 7.5 mg prn at bedtime Continued discussion on being consistent with same sleep schedule, Avoid large meals, caffeine, and alcohol before bedtime.

## 2023-06-26 NOTE — Patient Instructions (Signed)

## 2023-06-27 LAB — CBC WITH DIFFERENTIAL/PLATELET
Basophils Absolute: 0 10*3/uL (ref 0.0–0.2)
Basos: 1 %
EOS (ABSOLUTE): 0.1 10*3/uL (ref 0.0–0.4)
Eos: 1 %
Hematocrit: 41.3 % (ref 34.0–46.6)
Hemoglobin: 13.4 g/dL (ref 11.1–15.9)
Immature Grans (Abs): 0 10*3/uL (ref 0.0–0.1)
Immature Granulocytes: 0 %
Lymphocytes Absolute: 2.3 10*3/uL (ref 0.7–3.1)
Lymphs: 30 %
MCH: 30.7 pg (ref 26.6–33.0)
MCHC: 32.4 g/dL (ref 31.5–35.7)
MCV: 95 fL (ref 79–97)
Monocytes Absolute: 0.5 10*3/uL (ref 0.1–0.9)
Monocytes: 6 %
Neutrophils Absolute: 4.8 10*3/uL (ref 1.4–7.0)
Neutrophils: 62 %
Platelets: 367 10*3/uL (ref 150–450)
RBC: 4.36 x10E6/uL (ref 3.77–5.28)
RDW: 13.9 % (ref 11.7–15.4)
WBC: 7.7 10*3/uL (ref 3.4–10.8)

## 2023-06-27 LAB — BMP8+EGFR
BUN/Creatinine Ratio: 16 (ref 9–23)
BUN: 12 mg/dL (ref 6–24)
CO2: 25 mmol/L (ref 20–29)
Calcium: 10.1 mg/dL (ref 8.7–10.2)
Chloride: 103 mmol/L (ref 96–106)
Creatinine, Ser: 0.75 mg/dL (ref 0.57–1.00)
Glucose: 97 mg/dL (ref 70–99)
Potassium: 4.3 mmol/L (ref 3.5–5.2)
Sodium: 143 mmol/L (ref 134–144)
eGFR: 92 mL/min/{1.73_m2} (ref >59)

## 2023-06-27 LAB — LIPID PANEL
Chol/HDL Ratio: 2.5 {ratio} (ref 0.0–4.4)
Cholesterol, Total: 180 mg/dL (ref 100–199)
HDL: 71 mg/dL (ref >39)
LDL Chol Calc (NIH): 84 mg/dL (ref 0–99)
Triglycerides: 147 mg/dL (ref 0–149)
VLDL Cholesterol Cal: 25 mg/dL (ref 5–40)

## 2023-06-27 LAB — HEMOGLOBIN A1C
Est. average glucose Bld gHb Est-mCnc: 120 mg/dL
Hgb A1c MFr Bld: 5.8 % — ABNORMAL HIGH (ref 4.8–5.6)

## 2023-06-27 NOTE — Progress Notes (Signed)
Please inform patient labs are normal,  Hemoglobin A1c 5.8 (pre diabetes range) decreased  from prior reading which is great.  No medication intervention just lifestyle changes   A Sample Day of Eating for Diabetes or Prediabetes:  Breakfast: Oatmeal with a handful of berries and a sprinkle of chia seeds, paired with a boiled egg.  Lunch: Grilled chicken breast on a bed of spinach and kale, topped with avocado, a drizzle of olive oil, and a slice of whole grain bread.  Snack: A handful of almonds or a small apple with peanut butter.  Dinner: Baked salmon with roasted broccoli and quinoa.  Snack (if needed): A piece of cheese or a small bowl of Greek yogurt.  Find an activity that you will enjoyandstart to be active at least 5 days a week for 30 minutes each day.

## 2023-06-28 LAB — HM DIABETES EYE EXAM

## 2023-08-07 LAB — HM MAMMOGRAPHY

## 2023-08-14 ENCOUNTER — Encounter: Payer: Self-pay | Admitting: Internal Medicine

## 2023-10-03 ENCOUNTER — Other Ambulatory Visit: Payer: Self-pay | Admitting: Family Medicine

## 2023-10-03 MED ORDER — AMLODIPINE BESYLATE 10 MG PO TABS: 10.0000 mg | ORAL_TABLET | Freq: Every day | ORAL | 0 refills | Status: DC

## 2023-10-03 NOTE — Telephone Encounter (Signed)
 Copied from CRM [id number]. Topic: Clinical - Medication Refill >> Oct 03, 2023  9:46 AM Star East wrote: Most Recent Primary Care Visit:  Provider: Rosanna Comment  Department: [facility]  Visit Type: OFFICE VISIT  Date: 06/26/2023  Medication: ***  Has the patient contacted their pharmacy?  (Agent: If no, request that the patient contact the pharmacy for the refill. If patient does not wish to contact the pharmacy document the reason why and proceed with request.) (Agent: If yes, when and what did the pharmacy advise?)  Is this the correct pharmacy for this prescription?  If no, delete pharmacy and type the correct one.  This is the patient's preferred pharmacy:  [facility] - [place], [place] - [address] [place] [address] Phone: [phone] Fax: [phone]   Has the prescription been filled recently?   Is the patient out of the medication?   Has the patient been seen for an appointment in the last year OR does the patient have an upcoming appointment?   Can we respond through MyChart?   Agent: Please be advised that Rx refills may take up to 3 business days. We ask that you follow-up with your pharmacy.

## 2023-10-04 ENCOUNTER — Other Ambulatory Visit: Payer: Self-pay | Admitting: Internal Medicine

## 2023-10-05 ENCOUNTER — Other Ambulatory Visit: Payer: Self-pay | Admitting: Internal Medicine

## 2023-10-05 ENCOUNTER — Other Ambulatory Visit: Payer: Self-pay | Admitting: Family Medicine

## 2023-10-05 NOTE — Telephone Encounter (Signed)
Copied from CRM [id number]. Topic: Clinical - Medication Refill >> Oct 05, 2023  9:33 AM Gildardo Pounds wrote: Most Recent Primary Care Visit:  Provider: Rica Records  Department: [facility]  Visit Type: OFFICE VISIT  Date: 06/26/2023  Medication: ***  Has the patient contacted their pharmacy?  (Agent: If no, request that the patient contact the pharmacy for the refill. If patient does not wish to contact the pharmacy document the reason why and proceed with request.) (Agent: If yes, when and what did the pharmacy advise?)  Is this the correct pharmacy for this prescription?  If no, delete pharmacy and type the correct one.  This is the patient's preferred pharmacy:  [facility] - [place], [place] - [address] [place] [address] Phone: [phone] Fax: [phone]  [facility] [place], [place] - [address] [place] [address] Phone: [phone] Fax: [phone]   Has the prescription been filled recently?   Is the patient out of the medication?   Has the patient been seen for an appointment in the last year OR does the patient have an upcoming appointment?   Can we respond through MyChart?   Agent: Please be advised that Rx refills may take up to 3 business days. We ask that you follow-up with your pharmacy.

## 2023-10-11 ENCOUNTER — Other Ambulatory Visit: Payer: Self-pay | Admitting: Family Medicine

## 2023-10-21 ENCOUNTER — Other Ambulatory Visit: Payer: Self-pay | Admitting: Family Medicine

## 2023-10-21 DIAGNOSIS — R55 Syncope and collapse: Secondary | ICD-10-CM

## 2023-10-22 ENCOUNTER — Other Ambulatory Visit: Payer: Self-pay | Admitting: Internal Medicine

## 2023-10-27 ENCOUNTER — Other Ambulatory Visit: Payer: Self-pay | Admitting: Internal Medicine

## 2023-10-27 DIAGNOSIS — R55 Syncope and collapse: Secondary | ICD-10-CM

## 2023-11-03 ENCOUNTER — Other Ambulatory Visit: Payer: Self-pay | Admitting: Family Medicine

## 2023-11-08 ENCOUNTER — Other Ambulatory Visit: Payer: Self-pay | Admitting: Family Medicine

## 2023-11-08 MED ORDER — AMLODIPINE BESYLATE 10 MG PO TABS: 10.0000 mg | ORAL_TABLET | Freq: Every day | ORAL | 0 refills | Status: DC

## 2023-11-08 MED ORDER — MONTELUKAST SODIUM 10 MG PO TABS: 10.0000 mg | ORAL_TABLET | Freq: Every day | ORAL | 1 refills | Status: DC

## 2023-11-08 NOTE — Telephone Encounter (Signed)
 Copied from CRM [id number]. Topic: Clinical - Medication Refill >> Nov 08, 2023 12:38 PM Epimenio Foot F wrote: Most Recent Primary Care Visit:  Provider: Rica Records  Department: [facility]  Visit Type: OFFICE VISIT  Date: 06/26/2023  Medication: amLODipine (NORVASC) 10 MG tablet [[id number]], montelukast (SINGULAIR) 10 MG tablet [[id number]]  Has the patient contacted their pharmacy? Yes  (Agent: If yes, when and what did the pharmacy advise?) Pharmacy contacted   Is this the correct pharmacy for this prescription? Yes  This is the patient's preferred pharmacy:    [facility] [place], [place] - [address] [place] [address] Phone: [phone] Fax: [phone]   Has the prescription been filled recently? Yes  Is the patient out of the medication? Yes  Has the patient been seen for an appointment in the last year OR does the patient have an upcoming appointment? Yes  Can we respond through MyChart? No  Agent: Please be advised that Rx refills may take up to 3 business days. We ask that you follow-up with your pharmacy.

## 2023-11-21 ENCOUNTER — Other Ambulatory Visit: Payer: Self-pay | Admitting: Family Medicine

## 2023-11-21 NOTE — Telephone Encounter (Signed)
 Copied from CRM [id number]. Topic: Clinical - Medication Refill >> Nov 21, 2023 10:20 AM Shon Hale wrote: Most Recent Primary Care Visit:  Provider: Rica Records  Department: [facility]  Visit Type: OFFICE VISIT  Date: 06/26/2023  Medication: amLODipine (NORVASC) 10 MG tablet  Has the patient contacted their pharmacy? Yes  Is this the correct pharmacy for this prescription? Yes This is the patient's preferred pharmacy:  [facility] [place], [place] - [address] [place] [address] Phone: [phone] Fax: [phone]   Has the prescription been filled recently? No  Is the patient out of the medication? No  Has the patient been seen for an appointment in the last year OR does the patient have an upcoming appointment? Yes  Can we respond through MyChart? No  Agent: Please be advised that Rx refills may take up to 3 business days. We ask that you follow-up with your pharmacy.

## 2023-11-28 ENCOUNTER — Other Ambulatory Visit: Payer: Self-pay | Admitting: Family Medicine

## 2023-12-05 ENCOUNTER — Other Ambulatory Visit: Payer: Self-pay | Admitting: Family Medicine

## 2023-12-05 DIAGNOSIS — H401134 Primary open-angle glaucoma, bilateral, indeterminate stage: Secondary | ICD-10-CM | POA: Diagnosis not present

## 2023-12-05 NOTE — Telephone Encounter (Signed)
 Copied from CRM [id number]. Topic: Clinical - Medication Refill >> Dec 05, 2023 12:26 PM Almira Coaster wrote: Most Recent Primary Care Visit:  Provider: Rica Records  Department: [facility]  Visit Type: OFFICE VISIT  Date: 06/26/2023  Medication: amLODipine (NORVASC) 10 MG tablet  Has the patient contacted their pharmacy? Yes, Pharmacy is calling to place the request (Agent: If no, request that the patient contact the pharmacy for the refill. If patient does not wish to contact the pharmacy document the reason why and proceed with request.) (Agent: If yes, when and what did the pharmacy advise?)  Is this the correct pharmacy for this prescription? Yes If no, delete pharmacy and type the correct one.  This is the patient's preferred pharmacy:    [facility] [place], [place] - [address] [place] [address] Phone: [phone] Fax: [phone]   Has the prescription been filled recently? No  Is the patient out of the medication? No  Has the patient been seen for an appointment in the last year OR does the patient have an upcoming appointment? Yes  Can we respond through MyChart? No  Agent: Please be advised that Rx refills may take up to 3 business days. We ask that you follow-up with your pharmacy.

## 2023-12-13 ENCOUNTER — Other Ambulatory Visit: Payer: Self-pay | Admitting: Family Medicine

## 2023-12-13 MED ORDER — AMLODIPINE BESYLATE 10 MG PO TABS: 10.0000 mg | ORAL_TABLET | Freq: Every day | ORAL | 0 refills | Status: DC

## 2023-12-13 NOTE — Telephone Encounter (Signed)
 Copied from CRM [id number]. Topic: Clinical - Medication Refill >> Dec 13, 2023 10:23 AM Marland Kitchen D wrote: Most Recent Primary Care Visit:  Provider: Rica Records  Department: [facility]  Visit Type: OFFICE VISIT  Date: 06/26/2023  Medication: amLODipine (NORVASC) 10 MG tablet  Has the patient contacted their pharmacy? Yes (Agent: If no, request that the patient contact the pharmacy for the refill. If patient does not wish to contact the pharmacy document the reason why and proceed with request.) (Agent: If yes, when and what did the pharmacy advise?)  Is this the correct pharmacy for this prescription? Yes If no, delete pharmacy and type the correct one.  This is the patient's preferred pharmacy:   [facility] [place], [place] - [address] [place] [address] Phone: [phone] Fax: [phone]   Has the prescription been filled recently? No  Is the patient out of the medication? Yes  Has the patient been seen for an appointment in the last year OR does the patient have an upcoming appointment? Yes  Can we respond through MyChart? No  Agent: Please be advised that Rx refills may take up to 3 business days. We ask that you follow-up with your pharmacy.

## 2023-12-19 ENCOUNTER — Ambulatory Visit: Payer: 59

## 2023-12-31 ENCOUNTER — Ambulatory Visit (INDEPENDENT_AMBULATORY_CARE_PROVIDER_SITE_OTHER)

## 2023-12-31 VITALS — Ht 63.0 in | Wt 161.0 lb

## 2023-12-31 DIAGNOSIS — Z0001 Encounter for general adult medical examination with abnormal findings: Secondary | ICD-10-CM | POA: Diagnosis not present

## 2023-12-31 DIAGNOSIS — Z0100 Encounter for examination of eyes and vision without abnormal findings: Secondary | ICD-10-CM

## 2023-12-31 DIAGNOSIS — F810 Specific reading disorder: Secondary | ICD-10-CM

## 2023-12-31 DIAGNOSIS — Z789 Other specified health status: Secondary | ICD-10-CM

## 2023-12-31 DIAGNOSIS — Z0000 Encounter for general adult medical examination without abnormal findings: Secondary | ICD-10-CM

## 2023-12-31 NOTE — Patient Instructions (Signed)
 Kirsten Walls , Thank you for taking time to come for your Medicare Wellness Visit. I appreciate your ongoing commitment to your health goals. Please review the following plan we discussed and let me know if I can assist you in the future.   Referrals/Orders/Follow-Ups/Clinician Recommendations:  Next Medicare AWV: December 31, 2024 at 8:00 am telephone visit.   A referral has been placed for you to check and see what additional resources are available to you.   If you haven't heard from anyone within the next 7 business days, please call them and let them know a referral has been placed  Phone: [phone]  A referral was placed today for you to see an eye doctor to update you yearly eye exam. If you haven't heard from anyone in a week, please call the office to follow up  This is a list of the screening recommended for you and due dates:  Health Maintenance  Topic Date Due   Pneumococcal Vaccination (1 of 2 - PCV) Never done   Pap with HPV screening  Never done   COVID-19 Vaccine (4 - 2024-25 season) 05/20/2023   Flu Shot  04/18/2024   Mammogram  08/06/2024   Medicare Annual Wellness Visit  12/30/2024   Colon Cancer Screening  08/17/2025   DTaP/Tdap/Td vaccine (2 - Td or Tdap) 12/19/2032   Hepatitis C Screening  Completed   HIV Screening  Completed   Zoster (Shingles) Vaccine  Completed   HPV Vaccine  Aged Out   Meningitis B Vaccine  Aged Out    Advanced directives: (Declined) Advance directive discussed with you today. Even though you declined this today, please call our office should you change your mind, and we can give you the proper paperwork for you to fill out. Advance Care Planning is important because it:  [x]  Makes sure you receive the medical care that is consistent with your values, goals, and preferences  [x]  It provides guidance to your family and loved ones and it also reduces their decisional burden about whether or not they are making the right decisions based on  what you want done  Follow the link provided in your after visit summary or read over the paperwork we have mailed to you to help you started getting your Advance Directives in place. If you need assistance in completing these, please reach out to [place] so that we can help you!   Next Medicare Annual Wellness Visit scheduled for next year: yes  Managing Pain Without Opioids Opioids are strong medicines used to treat moderate to severe pain. For some people, especially those who have long-term (chronic) pain, opioids may not be the best choice for pain management due to: Side effects like nausea, constipation, and sleepiness. The risk of addiction (opioid use disorder). The longer you take opioids, the greater your risk of addiction. Pain that lasts for more than 3 months is called chronic pain. Managing chronic pain usually requires more than one approach and is often provided by a team of health care providers working together (multidisciplinary approach). Pain management may be done at a pain management center or pain clinic. How to manage pain without the use of opioids Use non-opioid medicines Non-opioid medicines for pain may include: Over-the-counter or prescription non-steroidal anti-inflammatory drugs (NSAIDs). These may be the first medicines used for pain. They work well for muscle and bone pain, and they reduce swelling. Acetaminophen. This over-the-counter medicine may work well for milder pain but not swelling. Antidepressants. These may be used  to treat chronic pain. A certain type of antidepressant (tricyclics) is often used. These medicines are given in lower doses for pain than when used for depression. Anticonvulsants. These are usually used to treat seizures but may also reduce nerve (neuropathic) pain. Muscle relaxants. These relieve pain caused by sudden muscle tightening (spasms). You may also use a pain medicine that is applied to the skin as a patch, cream, or gel (topical  analgesic), such as a numbing medicine. These may cause fewer side effects than medicines taken by mouth. Do certain therapies as directed Some therapies can help with pain management. They include: Physical therapy. You will do exercises to gain strength and flexibility. A physical therapist may teach you exercises to move and stretch parts of your body that are weak, stiff, or painful. You can learn these exercises at physical therapy visits and practice them at home. Physical therapy may also involve: Massage. Heat wraps or applying heat or cold to affected areas. Electrical signals that interrupt pain signals (transcutaneous electrical nerve stimulation, TENS). Weak lasers that reduce pain and swelling (low-level laser therapy). Signals from your body that help you learn to regulate pain (biofeedback). Occupational therapy. This helps you to learn ways to function at home and work with less pain. Recreational therapy. This involves trying new activities or hobbies, such as a physical activity or drawing. Mental health therapy, including: Cognitive behavioral therapy (CBT). This helps you learn coping skills for dealing with pain. Acceptance and commitment therapy (ACT) to change the way you think and react to pain. Relaxation therapies, including muscle relaxation exercises and mindfulness-based stress reduction. Pain management counseling. This may be individual, family, or group counseling.  Receive medical treatments Medical treatments for pain management include: Nerve block injections. These may include a pain blocker and anti-inflammatory medicines. You may have injections: Near the spine to relieve chronic back or neck pain. Into joints to relieve back or joint pain. Into nerve areas that supply a painful area to relieve body pain. Into muscles (trigger point injections) to relieve some painful muscle conditions. A medical device placed near your spine to help block pain signals  and relieve nerve pain or chronic back pain (spinal cord stimulation device). Acupuncture. Follow these instructions at home Medicines Take over-the-counter and prescription medicines only as told by your health care provider. If you are taking pain medicine, ask your health care providers about possible side effects to watch out for. Do not drive or use heavy machinery while taking prescription opioid pain medicine. Lifestyle  Do not use drugs or alcohol to reduce pain. If you drink alcohol, limit how much you have to: 0-1 drink a day for women who are not pregnant. 0-2 drinks a day for men. Know how much alcohol is in a drink. In the [place] one drink equals one 12 oz bottle of beer (355 mL), one 5 oz glass of wine (148 mL), or one 1 oz glass of hard liquor (44 mL). Do not use any products that contain nicotine or tobacco. These products include cigarettes, chewing tobacco, and vaping devices, such as e-cigarettes. If you need help quitting, ask your health care provider. Eat a healthy diet and maintain a healthy weight. Poor diet and excess weight may make pain worse. Eat foods that are high in fiber. These include fresh fruits and vegetables, whole grains, and beans. Limit foods that are high in fat and processed sugars, such as fried and sweet foods. Exercise regularly. Exercise lowers stress and may help relieve  pain. Ask your health care provider what activities and exercises are safe for you. If your health care provider approves, join an exercise class that combines movement and stress reduction. Examples include yoga and tai chi. Get enough sleep. Lack of sleep may make pain worse. Lower stress as much as possible. Practice stress reduction techniques as told by your therapist. General instructions Work with all your pain management providers to find the treatments that work best for you. You are an important member of your pain management team. There are many things you can do to  reduce pain on your own. Consider joining an online or in-person support group for people who have chronic pain. Keep all follow-up visits. This is important. Where to find more information You can find more information about managing pain without opioids from: American Academy of Pain Medicine: painmed.org Institute for Chronic Pain: instituteforchronicpain.org American Chronic Pain Association: theacpa.org Contact a health care provider if: You have side effects from pain medicine. Your pain gets worse or does not get better with treatments or home therapy. You are struggling with anxiety or depression. Summary Many types of pain can be managed without opioids. Chronic pain may respond better to pain management without opioids. Pain is best managed when you and a team of health care providers work together. Pain management without opioids may include non-opioid medicines, medical treatments, physical therapy, mental health therapy, and lifestyle changes. Tell your health care providers if your pain gets worse or is not being managed well enough. This information is not intended to replace advice given to you by your health care provider. Make sure you discuss any questions you have with your health care provider. Document Revised: 12/15/2020 Document Reviewed: 12/15/2020 Elsevier Patient Education  2024 [employer].  Understanding Your Risk for Falls Millions of people have serious injuries from falls each year. It is important to understand your risk of falling. Talk with your health care provider about your risk and what you can do to lower it. If you do have a serious fall, make sure to tell your provider. Falling once raises your risk of falling again. How can falls affect me? Serious injuries from falls are common. These include: Broken bones, such as hip fractures. Head injuries, such as traumatic brain injuries (TBI) or concussions. A fear of falling can cause you to avoid  activities and stay at home. This can make your muscles weaker and raise your risk for a fall. What can increase my risk? There are a number of risk factors that increase your risk for falling. The more risk factors you have, the higher your risk of falling. Serious injuries from a fall happen most often to people who are older than 60 years old. Teenagers and young adults ages 58-29 are also at higher risk. Common risk factors include: Weakness in the lower body. Being generally weak or confused due to long-term (chronic) illness. Dizziness or balance problems. Poor vision. Medicines that cause dizziness or drowsiness. These may include: Medicines for your blood pressure, heart, anxiety, insomnia, or swelling (edema). Pain medicines. Muscle relaxants. Other risk factors include: Drinking alcohol. Having had a fall in the past. Having foot pain or wearing improper footwear. Working at a dangerous job. Having any of the following in your home: Tripping hazards, such as floor clutter or loose rugs. Poor lighting. Pets. Having dementia or memory loss. What actions can I take to lower my risk of falling?     Physical activity Stay physically fit. Do  strength and balance exercises. Consider taking a regular class to build strength and balance. Yoga and tai chi are good options. Vision Have your eyes checked every year and your prescription for glasses or contacts updated as needed. Shoes and walking aids Wear non-skid shoes. Wear shoes that have rubber soles and low heels. Do not wear high heels. Do not walk around the house in socks or slippers. Use a cane or walker as told by your provider. Home safety Attach secure railings on both sides of your stairs. Install grab bars for your bathtub, shower, and toilet. Use a non-skid mat in your bathtub or shower. Attach bath mats securely with double-sided, non-slip rug tape. Use good lighting in all rooms. Keep a flashlight near your  bed. Make sure there is a clear path from your bed to the bathroom. Use night-lights. Do not use throw rugs. Make sure all carpeting is taped or tacked down securely. Remove all clutter from walkways and stairways, including extension cords. Repair uneven or broken steps and floors. Avoid walking on icy or slippery surfaces. Walk on the grass instead of on icy or slick sidewalks. Use ice melter to get rid of ice on walkways in the winter. Use a cordless phone. Questions to ask your health care provider Can you help me check my risk for a fall? Do any of my medicines make me more likely to fall? Should I take a vitamin D supplement? What exercises can I do to improve my strength and balance? Should I make an appointment to have my vision checked? Do I need a bone density test to check for weak bones (osteoporosis)? Would it help to use a cane or a walker? Where to find more information Centers for Disease Control and Prevention, [employer]: TonerPromos.no Community-Based Fall Prevention Programs: TonerPromos.no [employer] on Aging: BaseRingTones.pl Contact a health care provider if: You fall at home. You are afraid of falling at home. You feel weak, drowsy, or dizzy. This information is not intended to replace advice given to you by your health care provider. Make sure you discuss any questions you have with your health care provider. Document Revised: 05/08/2022 Document Reviewed: 05/08/2022 Elsevier Patient Education  2024 [employer].

## 2023-12-31 NOTE — Progress Notes (Signed)
 Please attest and cosign this visit due to patients primary care provider not being in the office at the time the visit was completed.  Because this visit was a virtual/telehealth visit,  certain criteria was not obtained, such a blood pressure, CBG if applicable, and timed get up and go. Any medications not marked as "taking" were not mentioned during the medication reconciliation part of the visit. Any vitals not documented were not able to be obtained due to this being a telehealth visit or patient was unable to self-report a recent blood pressure reading due to a lack of equipment at home via telehealth. Vitals that have been documented are verbally provided by the patient.   Subjective:   Kirsten Walls is a 58 y.o. who presents for a Medicare Wellness preventive visit.  Visit Complete: Virtual I connected with  Kirsten Walls on 12/31/23 by a audio enabled telemedicine application and verified that I am speaking with the correct person using two identifiers.  Patient Location: Home  Provider Location: Home Office  I discussed the limitations of evaluation and management by telemedicine. The patient expressed understanding and agreed to proceed.  Vital Signs: Because this visit was a virtual/telehealth visit, some criteria may be missing or patient reported. Any vitals not documented were not able to be obtained and vitals that have been documented are patient reported.  VideoDeclined- This patient declined Librarian, academic. Therefore the visit was completed with audio only.  Persons Participating in Visit: Patient.  AWV Questionnaire: No: Patient Medicare AWV questionnaire was not completed prior to this visit.  Cardiac Risk Factors include: advanced age (>31men, >11 women);hypertension;sedentary lifestyle;Other (see comment);smoking/ tobacco exposure, Risk factor comments: seizures     Objective:    Today's Vitals   12/31/23 0906 12/31/23 0912   Weight: 161 lb (73 kg)   Height: 5\' 3"  (1.6 m)   PainSc:  9    Body mass index is 28.52 kg/m.     12/31/2023   11:11 AM 12/18/2022    1:05 PM 03/28/2022    7:58 AM 04/19/2021   10:38 AM 09/12/2020    8:35 AM 04/15/2020   10:05 PM 03/22/2020   11:03 PM  Advanced Directives  Does Patient Have a Medical Advance Directive? No No No No No No No  Would patient like information on creating a medical advance directive? No - Patient declined No - Patient declined No - Patient declined   No - Patient declined No - Patient declined    Current Medications (verified) Outpatient Encounter Medications as of 12/31/2023  Medication Sig   albuterol (VENTOLIN HFA) 108 (90 Base) MCG/ACT inhaler Inhale 1 puff by mouth four times a day as needed   amLODipine (NORVASC) 10 MG tablet Take 1 tablet (10 mg total) by mouth daily.   baclofen (LIORESAL) 10 MG tablet Take 10 mg by mouth 2 (two) times daily as needed for muscle spasms.    celecoxib (CELEBREX) 200 MG capsule Take by mouth.   chlorpheniramine-HYDROcodone (TUSSIONEX PENNKINETIC ER) 10-8 MG/5ML SUER Take 5 mLs by mouth every 12 (twelve) hours as needed for cough.   Cholecalciferol 125 MCG (5000 UT) capsule Take by mouth.   clobetasol (TEMOVATE) 0.05 % external solution Apply topically.   EPINEPHrine (EPIPEN 2-PAK) 0.3 mg/0.3 mL IJ SOAJ injection Inject 0.3 mLs (0.3 mg total) into the muscle as needed for anaphylaxis.   esomeprazole (NEXIUM) 40 MG capsule Take by mouth.   GOODSENSE CLEARLAX 17 GM/SCOOP powder MIX 1 CAPFUL (17 GRAMS)  WITH 8OZ OF WATER OR JUICE DAILY.   HYDROcodone-acetaminophen (NORCO/VICODIN) 5-325 MG tablet Take 1 tablet by mouth every 6 (six) hours as needed. (Patient not taking: Reported on 12/31/2023)   Hydrocortisone, Perianal, 1 % CREA Apply topically.   hydrOXYzine (VISTARIL) 25 MG capsule Take 25 mg by mouth 3 (three) times daily.   ibuprofen (ADVIL) 800 MG tablet Take 1 tablet by mouth every day as needed   mirtazapine (REMERON)  7.5 MG tablet Take 1 tablet (7.5 mg total) by mouth at bedtime.   montelukast (SINGULAIR) 10 MG tablet Take 1 tablet (10 mg total) by mouth daily.   Multiple Vitamin (MULTIVITAMIN PO) Take 1 tablet by mouth daily.   oxymetazoline (NASAL RELIEF) 0.05 % nasal spray 1 spray 2 (two) times a day.   pantoprazole (PROTONIX) 40 MG tablet Take 1 tablet by mouth twice daily   polycarbophil (FIBERCON) 625 MG tablet Take 1 tablet (625 mg total) by mouth daily.   predniSONE (DELTASONE) 20 MG tablet Take 20 mg by mouth daily. (Patient not taking: Reported on 12/31/2023)   simethicone (GAS-X) 80 MG chewable tablet Chew 1 tablet (80 mg total) by mouth every 6 (six) hours as needed for flatulence.   topiramate (TOPAMAX) 25 MG tablet Take 100 mg by mouth daily.   triamcinolone cream (KENALOG) 0.1 % Apply 1 Application topically 2 (two) times daily.   Vitamin D, Ergocalciferol, (DRISDOL) 1.25 MG (50000 UNIT) CAPS capsule Take by mouth.   No facility-administered encounter medications on file as of 12/31/2023.    Allergies (verified) Penicillins, Topiramate, Azithromycin, and Sulfamethoxazole-trimethoprim   History: Past Medical History:  Diagnosis Date   Arthritis    Asthma    Bronchitis    Chronic shoulder pain    Epistaxis    Hypertension    Insomnia    Seizures (HCC)    Past Surgical History:  Procedure Laterality Date   ABDOMINAL HYSTERECTOMY     BUNIONECTOMY  both big toe   CESAREAN SECTION  x2   COLONOSCOPY N/A 08/18/2015   Procedure: COLONOSCOPY;  Surgeon: Suzette Espy, MD;  Location: [facility];  Service: Endoscopy;  Laterality: N/A;  1000   FOOT SURGERY     KNEE SURGERY     SHOULDER ARTHROSCOPY     TUMOR REMOVAL  left knee cap   Family History  Problem Relation Age of Onset   Hypertension Mother    Hyperlipidemia Mother    Diabetes Mother    Asthma Other    Colon cancer Neg Hx    Social History   Socioeconomic History   Marital status: Single    Spouse name: Not on file    Number of children: 3   Years of education: Not on file   Highest education level: Not on file  Occupational History   Not on file  Tobacco Use   Smoking status: Some Days    Types: Cigarettes   Smokeless tobacco: Never   Tobacco comments:    Only smokes when she drinks   Vaping Use   Vaping status: Former  Substance and Sexual Activity   Alcohol use: Yes    Comment: social   Drug use: No   Sexual activity: Not Currently    Birth control/protection: Surgical    Comment: hyst  Other Topics Concern   Not on file  Social History Narrative   Patient wants to take classes to learn how to spell and read better so she can get her GED. Referral placed today  to assist patient with reaching these goals.    Social Drivers of Corporate investment banker Strain: Low Risk  (12/31/2023)   Overall Financial Resource Strain (CARDIA)    Difficulty of Paying Living Expenses: Not hard at all  Food Insecurity: No Food Insecurity (12/31/2023)   Hunger Vital Sign    Worried About Running Out of Food in the Last Year: Never true    Ran Out of Food in the Last Year: Never true  Transportation Needs: No Transportation Needs (12/31/2023)   PRAPARE - Administrator, Civil Service (Medical): No    Lack of Transportation (Non-Medical): No  Physical Activity: Patient Declined (12/31/2023)   Exercise Vital Sign    Days of Exercise per Week: Patient declined    Minutes of Exercise per Session: Patient declined  Stress: No Stress Concern Present (12/31/2023)   [employer] of Occupational Health - Occupational Stress Questionnaire    Feeling of Stress : Not at all  Social Connections: Unknown (12/31/2023)   Social Connection and Isolation Panel [NHANES]    Frequency of Communication with Friends and Family: More than three times a week    Frequency of Social Gatherings with Friends and Family: More than three times a week    Attends Religious Services: More than 4 times per year     Active Member of [employer] or Organizations: No    Attends Banker Meetings: Never    Marital Status: Patient declined    Tobacco Counseling Ready to quit: Yes Counseling given: Yes Tobacco comments: Only smokes when she drinks     Clinical Intake:  Pre-visit preparation completed: Yes  Pain : 0-10 Pain Score: 9  Pain Type: Chronic pain Pain Location: Shoulder Pain Orientation: Right Pain Descriptors / Indicators: Sharp (per patient "shoulder locks up on me". previous surgery. RCR) Pain Onset: More than a month ago Pain Frequency: Constant     BMI - recorded: 28.52 Nutritional Status: BMI 25 -29 Overweight Nutritional Risks: None Diabetes: No  Lab Results  Component Value Date   HGBA1C 5.8 (H) 06/26/2023   HGBA1C 6.0 (H) 11/13/2022   HGBA1C 6.1 (H) 01/27/2021     How often do you need to have someone help you when you read instructions, pamphlets, or other written materials from your doctor or pharmacy?: 3 - Sometimes  Interpreter Needed?: No  Information entered by :: Sally Crazier CMA   Activities of Daily Living     12/31/2023   11:11 AM  In your present state of health, do you have any difficulty performing the following activities:  Hearing? 0  Vision? 0  Difficulty concentrating or making decisions? 0  Walking or climbing stairs? 0  Dressing or bathing? 0  Doing errands, shopping? 0  Preparing Food and eating ? N  Using the Toilet? N  In the past six months, have you accidently leaked urine? N  Do you have problems with loss of bowel control? N  Managing your Medications? N  Managing your Finances? N  Housekeeping or managing your Housekeeping? N    Patient Care Team: Del Amber Bail, Rogerio Clay, FNP as PCP - General (Family Medicine) Riley Cheadle, Windsor Hatcher, MD as Consulting Physician (Gastroenterology)  Indicate any recent Medical Services you may have received from other than [facility] providers in the past year (date may be approximate).      Assessment:   This is a routine wellness examination for Kirsten Walls.  Hearing/Vision screen Hearing Screening - Comments:: Patient denies any  hearing difficulties.   Vision Screening - Comments:: Patient's previous eye care provider retired. Referral placed today.    Goals Addressed             This Visit's Progress    Patient Stated       Graduate my drug and alcohol program       Depression Screen     12/31/2023   11:16 AM 12/25/2022   10:57 AM 12/18/2022    1:07 PM 11/13/2022   10:47 AM 04/01/2018    9:07 AM  PHQ 2/9 Scores  PHQ - 2 Score 4  0 4 0  PHQ- 9 Score 14   11   Exception Documentation  Patient refusal       Fall Risk     12/31/2023   11:11 AM 12/18/2022    1:07 PM 11/13/2022   10:47 AM  Fall Risk   Falls in the past year? 0 0 1  Number falls in past yr: 0  0  Injury with Fall? 0  1  Risk for fall due to : No Fall Risks    Follow up Falls prevention discussed;Falls evaluation completed      MEDICARE RISK AT HOME:  Medicare Risk at Home Any stairs in or around the home?: No If so, are there any without handrails?: No Home free of loose throw rugs in walkways, pet beds, electrical cords, etc?: Yes Adequate lighting in your home to reduce risk of falls?: Yes Life alert?: No Use of a cane, walker or w/c?: No Grab bars in the bathroom?: Yes Shower chair or bench in shower?: No Elevated toilet seat or a handicapped toilet?: No  TIMED UP AND GO:  Was the test performed?  No  Cognitive Function: Declined: Patient declined cognitive screening, but was able to answer questions in an accurate and timely manner. No cognitive impairments observed.        12/31/2023    9:16 AM 12/18/2022    1:07 PM  6CIT Screen  What Year? 0 points 0 points  What month? 0 points 0 points  What time? 0 points 0 points  Count back from 20 0 points 0 points  Months in reverse 0 points 4 points  Repeat phrase 0 points 0 points  Total Score 0 points 4 points     Immunizations Immunization History  Administered Date(s) Administered   Fluad Trivalent(High Dose 65+) 06/26/2023   PFIZER Comirnaty(Gray Top)Covid-19 Tri-Sucrose Vaccine 12/11/2019   PFIZER(Purple Top)SARS-COV-2 Vaccination 08/26/2020   Tdap 12/20/2022   Unspecified SARS-COV-2 Vaccination 01/03/2020   Zoster Recombinant(Shingrix) 06/19/2022, 09/19/2022    Screening Tests Health Maintenance  Topic Date Due   Pneumococcal Vaccine 82-43 Years old (1 of 2 - PCV) Never done   Cervical Cancer Screening (HPV/Pap Cotest)  Never done   COVID-19 Vaccine (4 - 2024-25 season) 05/20/2023   INFLUENZA VACCINE  04/18/2024   MAMMOGRAM  08/06/2024   Medicare Annual Wellness (AWV)  12/30/2024   Colonoscopy  08/17/2025   DTaP/Tdap/Td (2 - Td or Tdap) 12/19/2032   Hepatitis C Screening  Completed   HIV Screening  Completed   Zoster Vaccines- Shingrix  Completed   HPV VACCINES  Aged Out   Meningococcal B Vaccine  Aged Out    Health Maintenance  Health Maintenance Due  Topic Date Due   Pneumococcal Vaccine 29-72 Years old (1 of 2 - PCV) Never done   Cervical Cancer Screening (HPV/Pap Cotest)  Never done   COVID-19 Vaccine (4 - 2024-25  season) 05/20/2023   Health Maintenance Items Addressed: Referral sent to Optometry/Ophthalmology  Additional Screening:  Vision Screening: Recommended annual ophthalmology exams for early detection of glaucoma and other disorders of the eye.  Dental Screening: Recommended annual dental exams for proper oral hygiene  Community Resource Referral / Chronic Care Management: CRR required this visit?  Yes   CCM required this visit?  No     Plan:     I have personally reviewed and noted the following in the patient's chart:   Medical and social history Use of alcohol, tobacco or illicit drugs  Current medications and supplements including opioid prescriptions. Patient is not currently taking opioid prescriptions. Functional ability and  status Nutritional status Physical activity Advanced directives List of other physicians Hospitalizations, surgeries, and ER visits in previous 12 months Vitals Screenings to include cognitive, depression, and falls Referrals and appointments  In addition, I have reviewed and discussed with patient certain preventive protocols, quality metrics, and best practice recommendations. A written personalized care plan for preventive services as well as general preventive health recommendations were provided to patient.     Sallye Crease Teigan Sahli, CMA   12/31/2023   After Visit Summary: (Mail) Due to this being a telephonic visit, the after visit summary with patients personalized plan was offered to patient via mail   Notes: Please refer to Routing Comments.

## 2024-01-01 ENCOUNTER — Telehealth: Payer: Self-pay | Admitting: *Deleted

## 2024-01-01 NOTE — Progress Notes (Signed)
 Complex Care Management Note Care Guide Note  01/01/2024 Name: Kirsten Walls MRN: [id number] DOB: 04/12/1966   Complex Care Management Outreach Attempts: An unsuccessful telephone outreach was attempted today to offer the patient information about available complex care management services.  Follow Up Plan:  Additional outreach attempts will be made to offer the patient complex care management information and services.   Encounter Outcome:  No Answer  Barnie Bora  [facility]  [facility] Guide  Direct Dial: [phone]  Fax [phone]

## 2024-01-04 ENCOUNTER — Ambulatory Visit: Admitting: Family Medicine

## 2024-01-08 DIAGNOSIS — H401134 Primary open-angle glaucoma, bilateral, indeterminate stage: Secondary | ICD-10-CM | POA: Diagnosis not present

## 2024-01-08 LAB — HM DIABETES EYE EXAM

## 2024-01-08 NOTE — Progress Notes (Signed)
 Complex Care Management Note Care Guide Note  01/08/2024 Name: Kirsten Walls MRN: [id number] DOB: 1966/08/28   Complex Care Management Outreach Attempts: A second unsuccessful outreach was attempted today to offer the patient with information about available complex care management services.  Follow Up Plan:  Additional outreach attempts will be made to offer the patient complex care management information and services.   Encounter Outcome:  No Answer  Barnie Bora  [facility]  [facility], [facility] Guide  Direct Dial: [phone]  Fax [phone]

## 2024-01-09 NOTE — Progress Notes (Signed)
 Complex Care Management Note Care Guide Note  01/09/2024 Name: Kirsten Walls MRN: [id number] DOB: 07/11/1966   Complex Care Management Outreach Attempts: A third unsuccessful outreach was attempted today to offer the patient with information about available complex care management services.  Follow Up Plan:  No further outreach attempts will be made at this time. We have been unable to contact the patient to offer or enroll patient in complex care management services.  Encounter Outcome:  No Answer Barnie Bora  [facility]  [facility], [facility] Guide  Direct Dial: [phone]  Fax [phone]

## 2024-01-10 ENCOUNTER — Ambulatory Visit (INDEPENDENT_AMBULATORY_CARE_PROVIDER_SITE_OTHER): Admitting: Family Medicine

## 2024-01-10 ENCOUNTER — Encounter: Payer: Self-pay | Admitting: Family Medicine

## 2024-01-10 VITALS — BP 132/85 | HR 63 | Ht 63.0 in | Wt 170.1 lb

## 2024-01-10 DIAGNOSIS — R7303 Prediabetes: Secondary | ICD-10-CM

## 2024-01-10 DIAGNOSIS — E782 Mixed hyperlipidemia: Secondary | ICD-10-CM | POA: Diagnosis not present

## 2024-01-10 DIAGNOSIS — M545 Low back pain, unspecified: Secondary | ICD-10-CM | POA: Diagnosis not present

## 2024-01-10 DIAGNOSIS — E038 Other specified hypothyroidism: Secondary | ICD-10-CM

## 2024-01-10 DIAGNOSIS — M25561 Pain in right knee: Secondary | ICD-10-CM | POA: Diagnosis not present

## 2024-01-10 DIAGNOSIS — E559 Vitamin D deficiency, unspecified: Secondary | ICD-10-CM

## 2024-01-10 MED ORDER — CYCLOBENZAPRINE HCL 5 MG PO TABS: 5.0000 mg | ORAL_TABLET | Freq: Two times a day (BID) | ORAL | 3 refills | Status: AC | PRN

## 2024-01-10 MED ORDER — MIRTAZAPINE 30 MG PO TBDP
30.0000 mg | ORAL_TABLET | Freq: Every day | ORAL | 3 refills | Status: DC
Start: 2024-01-10 — End: 2024-06-16

## 2024-01-10 NOTE — Patient Instructions (Signed)

## 2024-01-10 NOTE — Progress Notes (Signed)
 Established Patient Office Visit   Subjective  Patient ID: Kirsten Walls, female    DOB: [birth year]  Age: 58 y.o. MRN: [id number]  Chief Complaint  Patient presents with   Annual Exam    Swelling in legs and pain in knees for over three weeks.     She  has a past medical history of Arthritis, Asthma, Bronchitis, Chronic shoulder pain, Epistaxis, Hypertension, Insomnia, and Seizures (HCC).  Knee Pain  There was no injury mechanism. The pain is present in the right knee. The pain is at a severity of 8/10. The pain has been Constant since onset. Associated symptoms include an inability to bear weight, muscle weakness, numbness and tingling. She reports no foreign bodies present. The symptoms are aggravated by movement, palpation and weight bearing. She has tried non-weight bearing, rest and NSAIDs for the symptoms. The treatment provided no relief.  Back Pain This is a recurrent problem. The problem occurs constantly. The problem has been waxing and waning since onset. The pain is present in the lumbar spine. The pain radiates to the left thigh and right thigh. The pain is at a severity of 6/10. The pain is The same all the time. The symptoms are aggravated by position and standing. Associated symptoms include leg pain, numbness and tingling. Pertinent negatives include no abdominal pain, chest pain, dysuria or fever. Risk factors include obesity and sedentary lifestyle. She has tried NSAIDs for the symptoms. The treatment provided no relief.    Review of Systems  Constitutional:  Negative for chills and fever.  Eyes:  Negative for blurred vision.  Respiratory:  Negative for shortness of breath.   Cardiovascular:  Negative for chest pain.  Gastrointestinal:  Negative for abdominal pain.  Genitourinary:  Negative for dysuria.  Musculoskeletal:  Positive for back pain and myalgias.  Neurological:  Positive for tingling and numbness.      Objective:     BP 132/85   Pulse 63   Ht 5'  3" (1.6 m)   Wt 170 lb 1 oz (77.1 kg)   SpO2 98%   BMI 30.12 kg/m  BP Readings from Last 3 Encounters:  01/10/24 132/85  06/26/23 124/77  03/26/23 125/82      Physical Exam Vitals reviewed.  Constitutional:      General: She is not in acute distress.    Appearance: Normal appearance. She is not ill-appearing, toxic-appearing or diaphoretic.  HENT:     Head: Normocephalic.  Eyes:     General:        Right eye: No discharge.        Left eye: No discharge.     Conjunctiva/sclera: Conjunctivae normal.  Cardiovascular:     Rate and Rhythm: Normal rate.     Pulses: Normal pulses.     Heart sounds: Normal heart sounds.  Pulmonary:     Effort: Pulmonary effort is normal. No respiratory distress.     Breath sounds: Normal breath sounds.  Abdominal:     General: Bowel sounds are normal.     Palpations: Abdomen is soft.     Tenderness: There is no abdominal tenderness. There is no right CVA tenderness, left CVA tenderness or guarding.  Musculoskeletal:     Cervical back: Normal range of motion.     Thoracic back: Decreased range of motion.     Lumbar back: Decreased range of motion.     Right knee: Decreased range of motion. Tenderness present.     Left knee: Normal range of  motion. No tenderness.  Skin:    General: Skin is warm and dry.     Capillary Refill: Capillary refill takes less than 2 seconds.  Neurological:     Mental Status: She is alert.     Coordination: Coordination normal.     Gait: Gait normal.  Psychiatric:        Mood and Affect: Mood normal.        Behavior: Behavior normal.      No results found for any visits on 01/10/24.  The 10-year ASCVD risk score (Arnett DK, et al., 2019) is: 10.5%    Assessment & Plan:  Prediabetes -     Hemoglobin A1c  Vitamin D  deficiency -     VITAMIN D  25 Hydroxy (Vit-D Deficiency, Fractures)  TSH (thyroid -stimulating hormone deficiency) -     TSH + free T4  Mixed hyperlipidemia -     Lipid panel -      CMP14+EGFR -     CBC with Differential/Platelet  Lumbar pain Assessment & Plan: Xray ordered Trial on Flexeril  5 mg PRN Discussed to focus on maintaining good posture, using lumbar support while sitting, and avoiding prolonged sitting or heavy lifting. Engage in low-impact exercises like walking or swimming to strengthen core muscles and reduce strain on the spine. Apply heat or ice packs as needed for pain relief and consider physical therapy for targeted exercises.   Orders: -     DG Lumbar Spine 2-3 Views; Future  Right knee pain, unspecified chronicity Assessment & Plan: Xray ordered  Advise patient treatment plan for knee pain includes rest, applying ice for 15-20 minutes several times a day, and gentle stretching and strengthening exercises to improve muscle support. Possible Physical therapy referral may help with proper technique. Using a knee brace or compression bandage can provide support, and elevating the leg helps reduce swelling. Low-impact exercises like swimming or cycling should be introduced gradually, and maintaining a healthy weight can prevent future pain.   Orders: -     DG Knee Complete 4 Views Right; Future  Other orders -     Cyclobenzaprine  HCl; Take 1 tablet (5 mg total) by mouth 2 (two) times daily as needed.  Dispense: 60 tablet; Refill: 3 -     Mirtazapine ; Take 1 tablet (30 mg total) by mouth at bedtime.  Dispense: 30 tablet; Refill: 3    Return in about 6 months (around 07/11/2024), or if symptoms worsen or fail to improve, for Pap smear, chronic follow-up.   Avelino Lek Amber Bail, FNP

## 2024-01-10 NOTE — Assessment & Plan Note (Addendum)
 Xray ordered Advise patient treatment plan for knee pain includes rest, applying ice for 15-20 minutes several times a day, and gentle stretching and strengthening exercises to improve muscle support. Possible Physical therapy referral may help with proper technique. Using a knee brace or compression bandage can provide support, and elevating the leg helps reduce swelling. Low-impact exercises like swimming or cycling should be introduced gradually, and maintaining a healthy weight can prevent future pain.

## 2024-01-10 NOTE — Assessment & Plan Note (Signed)
 Xray ordered Trial on Flexeril  5 mg PRN Discussed to focus on maintaining good posture, using lumbar support while sitting, and avoiding prolonged sitting or heavy lifting. Engage in low-impact exercises like walking or swimming to strengthen core muscles and reduce strain on the spine. Apply heat or ice packs as needed for pain relief and consider physical therapy for targeted exercises.

## 2024-01-11 DIAGNOSIS — E038 Other specified hypothyroidism: Secondary | ICD-10-CM | POA: Diagnosis not present

## 2024-01-11 DIAGNOSIS — E782 Mixed hyperlipidemia: Secondary | ICD-10-CM | POA: Diagnosis not present

## 2024-01-11 DIAGNOSIS — R7303 Prediabetes: Secondary | ICD-10-CM | POA: Diagnosis not present

## 2024-01-11 DIAGNOSIS — E559 Vitamin D deficiency, unspecified: Secondary | ICD-10-CM | POA: Diagnosis not present

## 2024-01-12 ENCOUNTER — Ambulatory Visit (HOSPITAL_COMMUNITY)
Admission: RE | Admit: 2024-01-12 | Discharge: 2024-01-12 | Disposition: A | Discharge status: Home / Self Care | Source: Ambulatory Visit | Attending: Family Medicine | Admitting: Family Medicine

## 2024-01-12 DIAGNOSIS — M545 Low back pain, unspecified: Secondary | ICD-10-CM | POA: Insufficient documentation

## 2024-01-12 DIAGNOSIS — M25561 Pain in right knee: Secondary | ICD-10-CM | POA: Insufficient documentation

## 2024-01-12 DIAGNOSIS — M47816 Spondylosis without myelopathy or radiculopathy, lumbar region: Secondary | ICD-10-CM | POA: Diagnosis not present

## 2024-01-12 DIAGNOSIS — M1711 Unilateral primary osteoarthritis, right knee: Secondary | ICD-10-CM | POA: Diagnosis not present

## 2024-01-12 DIAGNOSIS — M48061 Spinal stenosis, lumbar region without neurogenic claudication: Secondary | ICD-10-CM | POA: Diagnosis not present

## 2024-01-12 DIAGNOSIS — M4316 Spondylolisthesis, lumbar region: Secondary | ICD-10-CM | POA: Diagnosis not present

## 2024-01-12 DIAGNOSIS — M439 Deforming dorsopathy, unspecified: Secondary | ICD-10-CM | POA: Diagnosis not present

## 2024-01-12 DIAGNOSIS — M25461 Effusion, right knee: Secondary | ICD-10-CM | POA: Diagnosis not present

## 2024-01-12 LAB — LIPID PANEL
Chol/HDL Ratio: 2.3 ratio (ref 0.0–4.4)
Cholesterol, Total: 152 mg/dL (ref 100–199)
HDL: 66 mg/dL (ref >39)
LDL Chol Calc (NIH): 56 mg/dL (ref 0–99)
Triglycerides: 188 mg/dL — ABNORMAL HIGH (ref 0–149)
VLDL Cholesterol Cal: 30 mg/dL (ref 5–40)

## 2024-01-12 LAB — CBC WITH DIFFERENTIAL/PLATELET
Basophils Absolute: 0 10*3/uL (ref 0.0–0.2)
Basos: 1 %
EOS (ABSOLUTE): 0.1 10*3/uL (ref 0.0–0.4)
Eos: 1 %
Hematocrit: 38.3 % (ref 34.0–46.6)
Hemoglobin: 12.4 g/dL (ref 11.1–15.9)
Immature Grans (Abs): 0 10*3/uL (ref 0.0–0.1)
Immature Granulocytes: 0 %
Lymphocytes Absolute: 2.4 10*3/uL (ref 0.7–3.1)
Lymphs: 36 %
MCH: 30.3 pg (ref 26.6–33.0)
MCHC: 32.4 g/dL (ref 31.5–35.7)
MCV: 94 fL (ref 79–97)
Monocytes Absolute: 0.4 10*3/uL (ref 0.1–0.9)
Monocytes: 6 %
Neutrophils Absolute: 3.9 10*3/uL (ref 1.4–7.0)
Neutrophils: 56 %
Platelets: 339 10*3/uL (ref 150–450)
RBC: 4.09 x10E6/uL (ref 3.77–5.28)
RDW: 13.1 % (ref 11.7–15.4)
WBC: 6.8 10*3/uL (ref 3.4–10.8)

## 2024-01-12 LAB — CMP14+EGFR
ALT: 22 IU/L (ref 0–32)
AST: 31 IU/L (ref 0–40)
Albumin: 4.2 g/dL (ref 3.8–4.9)
Alkaline Phosphatase: 99 IU/L (ref 44–121)
BUN/Creatinine Ratio: 21 (ref 9–23)
BUN: 16 mg/dL (ref 6–24)
Bilirubin Total: 0.3 mg/dL (ref 0.0–1.2)
CO2: 22 mmol/L (ref 20–29)
Calcium: 9.7 mg/dL (ref 8.7–10.2)
Chloride: 103 mmol/L (ref 96–106)
Creatinine, Ser: 0.77 mg/dL (ref 0.57–1.00)
Globulin, Total: 2.6 g/dL (ref 1.5–4.5)
Glucose: 98 mg/dL (ref 70–99)
Potassium: 4 mmol/L (ref 3.5–5.2)
Sodium: 140 mmol/L (ref 134–144)
Total Protein: 6.8 g/dL (ref 6.0–8.5)
eGFR: 89 mL/min/{1.73_m2} (ref >59)

## 2024-01-12 LAB — TSH+FREE T4
Free T4: 0.95 ng/dL (ref 0.82–1.77)
TSH: 0.945 u[IU]/mL (ref 0.450–4.500)

## 2024-01-12 LAB — HEMOGLOBIN A1C
Est. average glucose Bld gHb Est-mCnc: 126 mg/dL
Hgb A1c MFr Bld: 6 % — ABNORMAL HIGH (ref 4.8–5.6)

## 2024-01-12 LAB — VITAMIN D 25 HYDROXY (VIT D DEFICIENCY, FRACTURES): Vit D, 25-Hydroxy: 36.8 ng/mL (ref 30.0–100.0)

## 2024-01-14 ENCOUNTER — Other Ambulatory Visit: Payer: Self-pay | Admitting: Family Medicine

## 2024-01-14 DIAGNOSIS — F5101 Primary insomnia: Secondary | ICD-10-CM

## 2024-01-16 ENCOUNTER — Other Ambulatory Visit: Payer: Self-pay | Admitting: Family Medicine

## 2024-01-16 DIAGNOSIS — G8929 Other chronic pain: Secondary | ICD-10-CM

## 2024-01-16 DIAGNOSIS — M545 Low back pain, unspecified: Secondary | ICD-10-CM

## 2024-01-16 NOTE — Progress Notes (Signed)
 Please inform patient, Lumbar xray results  At L4-L5 (the lower back), there is moderate narrowing of the disc space, meaning the cushion between the vertebrae is wearing down - a sign of degenerative disc disease.  There is Grade 1 anterolisthesis, which means the L4 vertebra has slightly slipped forward over L5. This is a mild form of spinal slippage, often due to wear and tear.    Treatment Plan: Take NSAIDs (e.g., ibuprofen ) or acetaminophen  for pain.  Start physical therapy to strengthen back and core.  Avoid heavy lifting and twisting.  Use heat or ice for pain relief.  If symptoms don't improve, consider MRI and follow up with Orthopedics  Referral placed to physical therapy

## 2024-01-16 NOTE — Progress Notes (Signed)
 Please explain results: Right knee xray  No fracture or dislocation.  Mild arthritis in all parts of the knee, worse on the inner side.  Small fluid buildup in the knee joint (effusion).   treatment Plan: NSAIDs (e.g., ibuprofen ) for pain/swelling.  Ice and rest as needed.  Physical therapy to strengthen muscles and support the joint.  Low-impact exercise (e.g., swimming, biking).  Consider knee brace and weight loss   Referral to orthopedics for joint injections

## 2024-01-16 NOTE — Progress Notes (Signed)
 Please inform patient,   Triglycerides  levels elevated, start lifestyle modifications and follow diet low in saturated fat.   I encourage over the counter omega-3 fish oil 1000 mg twice daily.      Hemoglobin A1c   6.0  indicates prediabetes - no medication intervention just lifestyle changes   Pre-Diabetes Diet  Eat More:  Whole grains: Oats, quinoa, brown rice. Vegetables: Leafy greens, broccoli, green beans. Fruits: Low-sugar like berries, apples. Lean proteins: Chicken, fish, beans, eggs. Healthy fats: Nuts, seeds, avocado, olive oil.  Limit:  Refined carbs: White bread, pastries. Sugary foods: Soda, candy, desserts. Fried and fatty foods.  Tips:  Eat balanced meals with portion control. Stay hydrated (water  over sugary drinks). Pair with regular exercise (e.g., walking). Example: Grilled chicken, quinoa, and steamed broccoli.  Combine diet with regular physical activity (e.g., 30 minutes of walking daily or 5 times per week.)

## 2024-01-17 ENCOUNTER — Other Ambulatory Visit: Payer: Self-pay | Admitting: Family Medicine

## 2024-01-21 ENCOUNTER — Ambulatory Visit: Payer: 59

## 2024-01-22 ENCOUNTER — Other Ambulatory Visit: Payer: Self-pay

## 2024-01-22 MED ORDER — AMLODIPINE BESYLATE 10 MG PO TABS: 10.0000 mg | ORAL_TABLET | Freq: Every day | ORAL | 2 refills | Status: DC

## 2024-01-23 ENCOUNTER — Ambulatory Visit

## 2024-02-18 ENCOUNTER — Ambulatory Visit: Payer: Self-pay

## 2024-02-18 NOTE — Telephone Encounter (Signed)
 Patient scheduled.

## 2024-02-18 NOTE — Telephone Encounter (Signed)
  Chief Complaint: right great toe pain Symptoms: right great toe pain Frequency: x 2 days Pertinent Negatives: Patient denies swelling/redness/bruising in toe, injury to toe, history of gout Disposition: [] ED /[] Urgent Care (no appt availability in office) / [x] Appointment(In office/virtual)/ []  [facility]/ [] Home Care/ [] Refused Recommended Disposition /[] [facility]/ []  Follow-up with PCP Additional Notes: Patient states she has taken ibuprofen  and BC powder but stopped because she doesn't want it to "tear up her stomach". Patient agreeable to next soonest appointment with Dr Kermit Ped for acute visit with [facility]  Copied from CRM [id number]. Topic: Clinical - Red Word Triage >> Feb 18, 2024  1:16 PM Oddis Bench wrote: Red Word that prompted transfer to Nurse Triage: Patient is calling about burning sensation and pain in her right big toe started 2 days ago. She states that she can not walk . Reason for Disposition  [1] SEVERE pain (e.g., excruciating, unable to do any normal activities) AND [2] not improved after 2 hours of pain medicine  Answer Assessment - Initial Assessment Questions 1. ONSET: "When did the pain start?"      X 2 days.  2. LOCATION: "Where is the pain located?"   (e.g., around nail, entire toe, at foot joint)      Right great toe.  3. PAIN: "How bad is the pain?"    (Scale 1-10; or mild, moderate, severe)   -  MILD (1-3): doesn't interfere with normal activities    -  MODERATE (4-7): interferes with normal activities (e.g., work or school) or awakens from sleep, limping    -  SEVERE (8-10): excruciating pain, unable to do any normal activities, unable to walk     9/10, burning.  4. APPEARANCE: "What does the toe look like?" (e.g., redness, swelling, bruising, pallor)     Denies.  5. CAUSE: "What do you think is causing the toe pain?"     Unsure.  6. OTHER SYMPTOMS: "Do you have any other symptoms?" (e.g., leg pain, rash, fever,  numbness)     Warmth/heat in toe, leg pain, rash  7. PREGNANCY: "Is there any chance you are pregnant?" "When was your last menstrual period?"     N/A.  Protocols used: Toe Pain-A-AH

## 2024-02-20 ENCOUNTER — Encounter: Payer: Self-pay | Admitting: Internal Medicine

## 2024-02-20 ENCOUNTER — Ambulatory Visit (INDEPENDENT_AMBULATORY_CARE_PROVIDER_SITE_OTHER): Admitting: Internal Medicine

## 2024-02-20 VITALS — BP 119/81 | HR 85 | Ht 63.0 in | Wt 172.4 lb

## 2024-02-20 DIAGNOSIS — M109 Gout, unspecified: Secondary | ICD-10-CM | POA: Insufficient documentation

## 2024-02-20 MED ORDER — PREDNISONE 10 MG (21) PO TBPK: ORAL_TABLET | ORAL | 0 refills | Status: DC

## 2024-02-20 NOTE — Assessment & Plan Note (Signed)
 Presenting today for an acute visit endorsing a 4-day history of pain and a burning sensation at the first MTP joint of the right foot.  History and exam findings today are concerning for gout as the underlying etiology.  Treatment options reviewed.  Prednisone  taper prescribed for pain relief.  She was instructed to return to care if symptoms worsen or fail to improve.  Otherwise, she is scheduled for routine follow-up in October.

## 2024-02-20 NOTE — Patient Instructions (Signed)
 It was a pleasure to see you today.  Thank you for giving us  the opportunity to be involved in your care.  Below is a brief recap of your visit and next steps.  We will plan to see you again in October.  Summary Prednisone  taper prescribed for treatment of suspected gout flare. Return to care if symptoms worsen or do not improve.

## 2024-02-20 NOTE — Progress Notes (Signed)
   Acute Office Visit  Subjective:     Patient ID: Kirsten Walls, female    DOB: [birth year], 58 y.o.   MRN: [id number]  Chief Complaint  Patient presents with   Toe Pain    Pain and burning sensation in right big toe   Kirsten Walls presents today for an acute visit endorsing a 4-day history of pain in the big toe of her right foot.  There was no inciting event or trauma at the onset of pain.  She did not drop any objects on the right foot.  She describes a burning sensation in the right big toe and specifically points at the MTP joint.  She denies any prior history of gout or neuropathy.  She has previously undergone bunionectomy along the first digit of the right foot.  Review of Systems  Musculoskeletal:  Positive for joint pain (First MTP of right foot).  All other systems reviewed and are negative.     Objective:    BP 119/81   Pulse 85   Ht 5\' 3"  (1.6 m)   Wt 172 lb 6.4 oz (78.2 kg)   SpO2 96%   BMI 30.54 kg/m   Physical Exam Musculoskeletal:     Comments: No obvious deformity, erythema, or edema present on inspection of the right foot.  There is a well-healed surgical scar at the site of previous bunionectomy.  There is tenderness to palpation over the first MTP of the right foot.  ROM of the big toe is generally intact.       Assessment & Plan:   Problem List Items Addressed This Visit       Acute gout involving toe of right foot - Primary   Presenting today for an acute visit endorsing a 4-day history of pain and a burning sensation at the first MTP joint of the right foot.  History and exam findings today are concerning for gout as the underlying etiology.  Treatment options reviewed.  Prednisone  taper prescribed for pain relief.  She was instructed to return to care if symptoms worsen or fail to improve.  Otherwise, she is scheduled for routine follow-up in October.      Meds ordered this encounter  Medications   predniSONE  (STERAPRED UNI-PAK 21 TAB) 10 MG  (21) TBPK tablet    Sig: Use as directed.    Dispense:  21 each    Refill:  0    Return if symptoms worsen or fail to improve.  Tobi Fortes, MD

## 2024-02-28 ENCOUNTER — Encounter: Payer: Self-pay | Admitting: Orthopedic Surgery

## 2024-02-28 ENCOUNTER — Ambulatory Visit: Admitting: Orthopedic Surgery

## 2024-02-28 VITALS — BP 129/80 | HR 74 | Ht 63.0 in | Wt 177.0 lb

## 2024-02-28 DIAGNOSIS — M545 Low back pain, unspecified: Secondary | ICD-10-CM

## 2024-02-28 DIAGNOSIS — M1711 Unilateral primary osteoarthritis, right knee: Secondary | ICD-10-CM | POA: Diagnosis not present

## 2024-02-28 MED ORDER — MELOXICAM 7.5 MG PO TABS: 7.5000 mg | ORAL_TABLET | Freq: Every day | ORAL | 5 refills | Status: DC

## 2024-02-28 MED ORDER — METHYLPREDNISOLONE ACETATE 40 MG/ML IJ SUSP
40.0000 mg | Freq: Once | INTRAMUSCULAR | Status: AC
  Administered 2024-02-28: 40 mg via INTRA_ARTICULAR

## 2024-02-28 NOTE — Progress Notes (Signed)
  Intake history:  BP 129/80   Pulse 74   Ht 5' 3 (1.6 m)   Wt 177 lb (80.3 kg)   BMI 31.35 kg/m  Body mass index is 31.35 kg/m.    WHAT ARE WE SEEING YOU FOR TODAY?   right knee(s) has pain swelling front of right knee, also has pain behind the knee   How long has this bothered you? (DOI?DOS?WS?)  approximately several years(s) ago  Anticoag.  No  Diabetes No  Heart disease No  Hypertension Yes  SMOKING HX Yes  Kidney disease No  Any ALLERGIES __________ Allergies  Allergen Reactions   Penicillins Hives    Did it involve swelling of the face/tongue/throat, SOB, or low BP? Yes Did it involve sudden or severe rash/hives, skin peeling, or any reaction on the inside of your mouth or nose? No Did you need to seek medical attention at a hospital or doctor's office? Yes When did it last happen?  Over 10 years ago-childhood If all above answers are "NO", may proceed with cephalosporin use.    Topiramate  Itching    Causes itching while out in the sun.    Azithromycin Rash   Sulfamethoxazole -Trimethoprim  Hives and Rash   ____________________________________   Treatment:  Have you taken:  Tylenol  Yes  Advil  Yes  Had PT No  Had injection No not recently   Other  _________________________

## 2024-02-28 NOTE — Progress Notes (Signed)
 Established patient with osteoarthritis of the right knee seen for other reasons in the past last seen 02/03/2021 for [facility] arthritis of the right thumb  Comes in today with chief complaint of right knee pain anterior medial joint line and posterior popliteal fossa  Has a history of lumbar disc disease as well  No history of trauma patient taking ibuprofen  does not help patient initially was advised to have physical therapy but declined  In the office today patient did not want an injection in her knee at first but changed her mind  No catching locking or giving way or mechanical symptoms are noted  Review of systems currently no chest pain or shortness of breath she does have back pain  Past Medical History:  Diagnosis Date   Arthritis    Asthma    Bronchitis    Chronic shoulder pain    Epistaxis    Hypertension    Insomnia    Seizures (HCC)    Past Surgical History:  Procedure Laterality Date   ABDOMINAL HYSTERECTOMY     BUNIONECTOMY  both big toe   CESAREAN SECTION  x2   COLONOSCOPY N/A 08/18/2015   Procedure: COLONOSCOPY;  Surgeon: Suzette Espy, MD;  Location: [facility];  Service: Endoscopy;  Laterality: N/A;  1000   FOOT SURGERY     KNEE SURGERY     SHOULDER ARTHROSCOPY     TUMOR REMOVAL  left knee cap    BP 129/80   Pulse 74   Ht 5' 3 (1.6 m)   Wt 177 lb (80.3 kg)   BMI 31.35 kg/m   She is awake alert and oriented x 3  Mood affect pleasant normal Cardiovascular exam no peripheral edema or swelling or change in temperature to the right lower extremity  Physical findings include the following  Lower back tenderness especially midline into the right she has tenderness in the back of the knee as well as the anteromedial joint line  No effusion is detected  Range of motion is 0 to 125 degrees  Stability tests were normal  Motor function normal  Skin envelope normal  Outside imaging  I will review the following films  #1 4 views right knee taken  January 12, 2024 the patient has degenerative changes in the joint symmetric joint space narrowing osteophyte seen on the medial femoral condyle or side subchondral sclerosis medial tibia  #2 lumbar spine 3 views  Asymmetric coronal plane alignment straightening of the lumbar lordosis and curve artifact from piercing noted in the left hemipelvis degenerative changes at L4-5 joint space narrowing at the disc osteophyte surrounding the 4 5 disc space  Encounter Diagnoses  Name Primary?   Lumbar pain    Primary osteoarthritis of right knee Yes   Assessment and plan assessment and plan  Recommend injection right knee and appropriate NSAID   Recommend physical therapy for back pain  Return as needed  Procedure note right knee injection   verbal consent was obtained to inject right knee joint  Timeout was completed to confirm the site of injection  The medications used were depomedrol 40 mg and 1% lidocaine  3 cc Anesthesia was provided by ethyl chloride and the skin was prepped with alcohol .  After cleaning the skin with alcohol  a 20-gauge needle was used to inject the right knee joint. There were no complications. A sterile bandage was applied.   Meds ordered this encounter  Medications   meloxicam  (MOBIC ) 7.5 MG tablet    Sig:  Take 1 tablet (7.5 mg total) by mouth daily.    Dispense:  30 tablet    Refill:  5

## 2024-02-28 NOTE — Patient Instructions (Signed)
 Physical therapy has been ordered for you at [facility]. They should call you to schedule, [phone] is the phone number to call, if you want to call to schedule.

## 2024-02-28 NOTE — Addendum Note (Signed)
 Addended byArla Lab on: 02/28/2024 10:52 AM   Modules accepted: Orders

## 2024-03-23 ENCOUNTER — Encounter (HOSPITAL_COMMUNITY): Payer: Self-pay

## 2024-03-23 ENCOUNTER — Emergency Department (HOSPITAL_COMMUNITY)
Admission: EM | Admit: 2024-03-23 | Discharge: 2024-03-23 | Discharge status: Against Medical Advice | Attending: Emergency Medicine | Admitting: Emergency Medicine

## 2024-03-23 ENCOUNTER — Other Ambulatory Visit: Payer: Self-pay

## 2024-03-23 DIAGNOSIS — R000 Tachycardia, unspecified: Secondary | ICD-10-CM | POA: Diagnosis not present

## 2024-03-23 DIAGNOSIS — R069 Unspecified abnormalities of breathing: Secondary | ICD-10-CM | POA: Diagnosis not present

## 2024-03-23 DIAGNOSIS — R0602 Shortness of breath: Secondary | ICD-10-CM | POA: Diagnosis not present

## 2024-03-23 DIAGNOSIS — Z79899 Other long term (current) drug therapy: Secondary | ICD-10-CM | POA: Diagnosis not present

## 2024-03-23 DIAGNOSIS — D649 Anemia, unspecified: Secondary | ICD-10-CM | POA: Diagnosis not present

## 2024-03-23 DIAGNOSIS — I10 Essential (primary) hypertension: Secondary | ICD-10-CM | POA: Insufficient documentation

## 2024-03-23 DIAGNOSIS — J449 Chronic obstructive pulmonary disease, unspecified: Secondary | ICD-10-CM | POA: Diagnosis not present

## 2024-03-23 DIAGNOSIS — R064 Hyperventilation: Secondary | ICD-10-CM | POA: Diagnosis not present

## 2024-03-23 NOTE — ED Provider Notes (Signed)
 [place] EMERGENCY DEPARTMENT AT [facility] Provider Note   CSN: [id number] Arrival date & time: 03/23/24  2112     Patient presents with: Shortness of Breath   Kirsten Walls is a 58 y.o. female.  She has history of COPD, GERD, anemia, hypertension, prediabetes.  She is brought in by EMS today for shortness of breath.  She states she lost her daughter and felt like her chest was caving in, she is using her inhaler.  She states she has been not eating or drinking or taking any of her medications and she thinks that is why she was feeling bad.  She feels comfortable now that she is in the emergency department and states she wants to go home to see her dog.  She has no chest pain, no current shortness of breath, no abdominal pain, no nausea or vomiting.    Shortness of Breath      Prior to Admission medications   Medication Sig Start Date End Date Taking? Authorizing Provider  albuterol  (VENTOLIN  HFA) 108 (90 Base) MCG/ACT inhaler Inhale 1 puff by mouth four times a day as needed 10/29/23   Del Orbe Polanco, Iliana, FNP  amLODipine  (NORVASC ) 10 MG tablet Take 1 tablet (10 mg total) by mouth daily. 01/22/24   Del Orbe Polanco, Iliana, FNP  brimonidine (ALPHAGAN) 0.2 % ophthalmic solution 1 drop 2 (two) times daily.    [provider]  Cholecalciferol  125 MCG (5000 UT) capsule Take by mouth. Patient not taking: Reported on 02/28/2024 02/23/21   [provider]  clobetasol (TEMOVATE) 0.05 % external solution Apply topically. Patient not taking: Reported on 02/28/2024 06/16/20   [provider]  cyclobenzaprine  (FLEXERIL ) 5 MG tablet Take 1 tablet (5 mg total) by mouth 2 (two) times daily as needed. 01/10/24   Del Orbe Polanco, Iliana, FNP  EPINEPHrine  (EPIPEN  2-PAK) 0.3 mg/0.3 mL IJ SOAJ injection Inject 0.3 mLs (0.3 mg total) into the muscle as needed for anaphylaxis. 01/15/19   Zackowski, Scott, MD  esomeprazole (NEXIUM) 40 MG capsule Take by mouth. Patient  not taking: Reported on 02/28/2024 03/17/20   [provider]  Eyelid Cleansers (SYSTANE LID WIPES) PADS Apply 1 drop topically daily with breakfast.    [provider]  GOODSENSE CLEARLAX 17 GM/SCOOP powder MIX 1 CAPFUL (17 GRAMS) WITH 8OZ OF WATER  OR JUICE DAILY. Patient not taking: Reported on 01/10/2024 05/01/23   Del Orbe Polanco, Iliana, FNP  Hydrocortisone, Perianal, 1 % CREA Apply topically. Patient not taking: Reported on 01/10/2024 04/05/21   [provider]  ibuprofen  (ADVIL ) 800 MG tablet Take 1 tablet by mouth every day as needed 10/22/23   Del Wilhelmena Falter, Iliana, FNP  meloxicam  (MOBIC ) 7.5 MG tablet Take 1 tablet (7.5 mg total) by mouth daily. 02/28/24   Margrette Taft BRAVO, MD  mirtazapine  (REMERON  SOL-TAB) 30 MG disintegrating tablet Take 1 tablet (30 mg total) by mouth at bedtime. 01/10/24   Del Orbe Polanco, Iliana, FNP  montelukast  (SINGULAIR ) 10 MG tablet Take 1 tablet by mouth every evening 01/17/24   Del Orbe Polanco, Iliana, FNP  Multiple Vitamin (MULTIVITAMIN PO) Take 1 tablet by mouth daily. Patient not taking: Reported on 02/28/2024    [provider]  oxymetazoline  (NASAL RELIEF) 0.05 % nasal spray 1 spray 2 (two) times a day. Patient not taking: Reported on 02/28/2024 03/29/20   [provider]  pantoprazole  (PROTONIX ) 40 MG tablet Take 1 tablet by mouth twice daily Patient not taking: Reported on 02/28/2024  10/29/23   Del Orbe Polanco, Iliana, FNP  polycarbophil (FIBERCON) 625 MG tablet Take 1 tablet (625 mg total) by mouth daily. Patient not taking: Reported on 02/28/2024 04/12/23   Del Orbe Polanco, Iliana, FNP  predniSONE  (STERAPRED UNI-PAK 21 TAB) 10 MG (21) TBPK tablet Use as directed. 02/20/24   Melvenia Manus BRAVO, MD  simethicone  (GAS-X) 80 MG chewable tablet Chew 1 tablet (80 mg total) by mouth every 6 (six) hours as needed for flatulence. Patient not taking: Reported on 02/28/2024 03/26/23   Del Orbe Polanco, Iliana, FNP  topiramate   (TOPAMAX ) 25 MG tablet Take 100 mg by mouth daily. Patient not taking: Reported on 02/28/2024    [provider]  triamcinolone  cream (KENALOG ) 0.1 % Apply 1 Application topically 2 (two) times daily. Patient not taking: Reported on 02/28/2024 03/26/23   Del Orbe Polanco, Iliana, FNP  Vitamin D , Ergocalciferol , (DRISDOL) 1.25 MG (50000 UNIT) CAPS capsule Take by mouth. Patient not taking: Reported on 02/28/2024 06/18/20   [provider]  vitamin E 180 MG (400 UNITS) capsule Take 400 Units by mouth daily. Patient not taking: Reported on 02/28/2024    [provider]  White Petrolatum-Mineral Oil (ARTIFICIAL TEARS) ointment Place 2 drops into both eyes as needed. Patient not taking: Reported on 02/28/2024    [provider]    Allergies: Penicillins, Topiramate , Azithromycin, and Sulfamethoxazole -trimethoprim     Review of Systems  Respiratory:  Positive for shortness of breath.     Updated Vital Signs BP (!) 162/137 (BP Location: Left Arm)   Pulse (!) 108   Temp 98 F (36.7 C) (Oral)   Resp 20   Ht 5' 2 (1.575 m)   Wt 78 kg   SpO2 100%   BMI 31.46 kg/m   Physical Exam Vitals and nursing note reviewed.  Constitutional:      General: She is not in acute distress.    Appearance: She is well-developed.  HENT:     Head: Normocephalic and atraumatic.  Eyes:     Extraocular Movements: Extraocular movements intact.     Conjunctiva/sclera: Conjunctivae normal.     Pupils: Pupils are equal, round, and reactive to light.  Cardiovascular:     Rate and Rhythm: Normal rate and regular rhythm.     Heart sounds: No murmur heard. Pulmonary:     Effort: Pulmonary effort is normal. No respiratory distress.     Breath sounds: Normal breath sounds.  Abdominal:     Palpations: Abdomen is soft.     Tenderness: There is no abdominal tenderness.  Musculoskeletal:        General: No swelling.     Cervical back: Neck supple.     Right lower leg: No tenderness. No  edema.     Left lower leg: No tenderness. No edema.  Skin:    General: Skin is warm and dry.     Capillary Refill: Capillary refill takes less than 2 seconds.  Neurological:     General: No focal deficit present.     Mental Status: She is alert and oriented to person, place, and time.  Psychiatric:        Mood and Affect: Mood normal.     (all labs ordered are listed, but only abnormal results are displayed) Labs Reviewed - No data to display  EKG: None  Radiology: No results found.   Procedures   Medications Ordered in the ED - No data to display  Medical Decision Making Differential diagnose includes but not limited to COPD, ACS, pneumonia, anemia, PE, other  ED course: Patient was brought in via EMS for shortness of breath.  She has COPD, still smokes but is not on home oxygen, saturations in the room are 96 to 98% on room air.  She states she has been very sad since losing her daughter this weekend and has not been taking her medicine or eating or drinking much.  She states now in the ER she is feeling better and she very much was a go home to take care of her dog.  She has no other complaints and is absolutely refusing any further testing or workup.  We discussed the risks of missed diagnosis of heart attack, blood clot, pneumonia, anemia and risk of permanent disability or death without appropriate workup.  She states this is my choice shedecision-making capacity, she is not intoxicated, she understands risk of her decision and still wants to leave.  Patient denies any specific barriers to care other than she feels well now and wants to go home to her dog.   She is invited to come back if she changed her mind or starts feeling worse.        Final diagnoses:  None    ED Discharge Orders     None          Suellen Sherran DELENA DEVONNA 03/23/24 2150    Francesca Elsie CROME, MD 03/24/24 1143

## 2024-03-23 NOTE — ED Triage Notes (Addendum)
 Patient from home for shortness of breath that started 04/12/2024 following the death of her daughter. EMS reports patient was combative on scene. Upon arrival to ER, patient is alert and oriented, airway patent and intact. Appears anxious/agitated, tearful during triage

## 2024-03-23 NOTE — Discharge Instructions (Signed)
 You are leaving the hospital AGAINST MEDICAL ADVICE, as we discussed we have not been able to appropriately evaluate you, you are having some shortness of breath, this could be caused by a lot of potentially dangerous medical conditions such as heart attack, pneumonia, blood clot in the lung, among other things.  Many of these conditions can cause serious outcomes up and to and including permanent disability or death.  Our plan was to get a chest x-ray, EKG and blood work.  Since you are refusing any further testing want to go home please know that you are welcome to come back if you have worsening symptoms or change your mind about getting evaluated.

## 2024-03-24 ENCOUNTER — Other Ambulatory Visit: Payer: Self-pay | Admitting: Family Medicine

## 2024-03-27 ENCOUNTER — Encounter (HOSPITAL_COMMUNITY): Payer: Self-pay

## 2024-03-27 ENCOUNTER — Ambulatory Visit (HOSPITAL_COMMUNITY)

## 2024-03-27 NOTE — Therapy (Signed)
 [facility] at [facility] [address] [place], [place], [address] Phone: [phone]   Fax:  [phone]  Patient Details  Name: Kirsten Walls MRN: [id number] Date of Birth: Nov 17, 1965 Referring Provider:  No ref. provider found  Encounter Date: 03/27/2024  Pt states she just had a death in the family and will not be able to come for evaluation until everything has past. Pt would like for us  to call next month to reschedule. I will let front desk know the situation at the pt seems overwhelmed at the time of the call.   Lang Ada, PT, DPT [facility] Office: [phone] 2:51 PM, 03/27/24   [facility] at [facility] [address] [place], [place], [address] Phone: [phone]   Fax:  [phone]

## 2024-05-01 ENCOUNTER — Ambulatory Visit: Admitting: Orthopedic Surgery

## 2024-05-07 ENCOUNTER — Ambulatory Visit: Admitting: Orthopedic Surgery

## 2024-05-08 NOTE — Therapy (Incomplete)
 OUTPATIENT PHYSICAL THERAPY THORACOLUMBAR EVALUATION   Patient Name: Kirsten Walls MRN: [id number] DOB:06-25-1966, 58 y.o., female Today's Date: 05/08/2024  END OF SESSION:   Past Medical History:  Diagnosis Date   Arthritis    Asthma    Bronchitis    Chronic shoulder pain    Epistaxis    Hypertension    Insomnia    Seizures (HCC)    Past Surgical History:  Procedure Laterality Date   ABDOMINAL HYSTERECTOMY     BUNIONECTOMY  both big toe   CESAREAN SECTION  x2   COLONOSCOPY N/A 08/18/2015   Procedure: COLONOSCOPY;  Surgeon: Lamar CHRISTELLA Hollingshead, MD;  Location: [facility];  Service: Endoscopy;  Laterality: N/A;  1000   FOOT SURGERY     KNEE SURGERY     SHOULDER ARTHROSCOPY     TUMOR REMOVAL  left knee cap   Patient Active Problem List   Diagnosis Date Noted   Acute gout involving toe of right foot 02/20/2024   Right knee pain 01/10/2024   Prediabetes 06/26/2023   Hypertension 03/26/2023   Constipation 12/25/2022   Dermatitis 12/25/2022   Depression, recurrent (HCC) 11/13/2022   Syncope 04/15/2020   Leukocytosis 03/23/2020   Acute blood loss anemia 03/23/2020   GERD (gastroesophageal reflux disease) 03/23/2020   Tobacco use 03/23/2020   Epistaxis 08/26/2019   Insomnia 07/30/2017   Chronic insomnia 07/30/2017   History of colonic polyps    Diverticulosis of colon without hemorrhage    Encounter for screening colonoscopy 07/27/2015   Polypharmacy 07/27/2015   Arthritis, shoulder region 04/27/2014   Acute bursitis of right shoulder 02/16/2014   Shoulder injury 02/16/2014   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   BUNION 03/17/2009   H N P-LUMBAR 06/03/2008   KNEE, ARTHRITIS, DEGEN./OSTEO 02/11/2008   NEOPLASMS UNSPEC NATURE BONE SOFT TISSUE&SKIN 02/03/2008   Lumbar pain 02/03/2008   DERANGEMENT MENISCUS 01/23/2008   JOINT EFFUSION, LEFT KNEE 01/23/2008   TEAR MEDIAL MENISCUS 01/23/2008    PCP: Terry Arlys Lloyd Higinio FNP  REFERRING PROVIDER: Margrette Taft BRAVO, MD  REFERRING DIAG: M54.50 (ICD-10-CM) - Lumbar pain  Rationale for Evaluation and Treatment: Rehabilitation  THERAPY DIAG:  No diagnosis found.  ONSET DATE: ***  SUBJECTIVE:                                                                                                                                                                                           SUBJECTIVE STATEMENT: ***  PERTINENT HISTORY:  ***  PAIN:  Are you having pain? {OPRCPAIN:27236}  PRECAUTIONS: {Therapy precautions:24002}  RED FLAGS: {PT Red Flags:29287}   WEIGHT BEARING RESTRICTIONS: {Yes ***/No:24003}  FALLS:  Has patient fallen in last 6 months? {fallsyesno:27318}  LIVING ENVIRONMENT: Lives with: {OPRC lives with:25569::lives with their family} Lives in: {Lives in:25570} Stairs: {opstairs:27293} Has following equipment at home: {Assistive devices:23999}  OCCUPATION: ***  PLOF: {PLOF:24004}  PATIENT GOALS: ***  NEXT MD VISIT: ***  OBJECTIVE:  Note: Objective measures were completed at Evaluation unless otherwise noted.  DIAGNOSTIC FINDINGS:  ***  PATIENT SURVEYS:  {rehab surveys:24030}  COGNITION: Overall cognitive status: {cognition:24006}     SENSATION: {sensation:27233}  MUSCLE LENGTH: Hamstrings: Right *** deg; Left *** deg Debby test: Right *** deg; Left *** deg  POSTURE: {posture:25561}  PALPATION: ***  LUMBAR ROM:   AROM eval  Flexion   Extension   Right lateral flexion   Left lateral flexion   Right rotation   Left rotation    (Blank rows = not tested)  LOWER EXTREMITY ROM:     {AROM/PROM:27142}  Right eval Left eval  Hip flexion    Hip extension    Hip abduction    Hip adduction    Hip internal rotation    Hip external rotation    Knee flexion    Knee extension    Ankle dorsiflexion    Ankle plantarflexion    Ankle inversion    Ankle eversion     (Blank rows = not tested)  LOWER EXTREMITY MMT:    MMT Right eval  Left eval  Hip flexion    Hip extension    Hip abduction    Hip adduction    Hip internal rotation    Hip external rotation    Knee flexion    Knee extension    Ankle dorsiflexion    Ankle plantarflexion    Ankle inversion    Ankle eversion     (Blank rows = not tested)  LUMBAR SPECIAL TESTS:  {lumbar special test:25242}  FUNCTIONAL TESTS:  {Functional tests:24029}  GAIT: Distance walked: *** Assistive device utilized: {Assistive devices:23999} Level of assistance: {Levels of assistance:24026} Comments: ***  TREATMENT DATE: 05/09/24 physical therapy evaluation and HEP instruction                                                                                                                                 PATIENT EDUCATION:  Education details: Patient educated on exam findings, POC, scope of PT, HEP, and ***. Person educated: Patient Education method: Explanation, Demonstration, and Handouts Education comprehension: verbalized understanding, returned demonstration, verbal cues required, and tactile cues required  HOME EXERCISE PROGRAM: ***  ASSESSMENT:  CLINICAL IMPRESSION: Patient is a 58 y.o. female who was seen today for physical therapy evaluation and treatment for M54.50 (ICD-10-CM) - Lumbar pain.   OBJECTIVE IMPAIRMENTS: {opptimpairments:25111}.   ACTIVITY LIMITATIONS: {activitylimitations:27494}  PARTICIPATION LIMITATIONS: {participationrestrictions:25113}  PERSONAL FACTORS: {Personal factors:25162} are also affecting patient's functional outcome.   REHAB POTENTIAL: Good  CLINICAL DECISION MAKING: Evolving/moderate complexity  EVALUATION COMPLEXITY: Moderate   GOALS: Goals reviewed with patient?  No  SHORT TERM GOALS: Target date: ***  *** Baseline: Goal status: INITIAL  2.  *** Baseline:  Goal status: INITIAL  3.  *** Baseline:  Goal status: INITIAL  4.  *** Baseline:  Goal status: INITIAL  5.  *** Baseline:  Goal status:  INITIAL  6.  *** Baseline:  Goal status: INITIAL  LONG TERM GOALS: Target date: ***  *** Baseline:  Goal status: INITIAL  2.  *** Baseline:  Goal status: INITIAL  3.  *** Baseline:  Goal status: INITIAL  4.  *** Baseline:  Goal status: INITIAL  5.  *** Baseline:  Goal status: INITIAL  6.  *** Baseline:  Goal status: INITIAL  PLAN:  PT FREQUENCY: {rehab frequency:25116}  PT DURATION: {rehab duration:25117}  PLANNED INTERVENTIONS: 97164- PT Re-evaluation, 97110-Therapeutic exercises, 97530- Therapeutic activity, 97112- Neuromuscular re-education, 97535- Self Care, 02859- Manual therapy, [id number]- Gait training, [id number]- Orthotic Fit/training, [id number]- Canalith repositioning, [id number]- Aquatic Therapy, 97760- Splinting, 97597- Wound care (first 20 sq cm), 97598- Wound care (each additional 20 sq cm)Patient/Family education, Balance training, Stair training, Taping, Dry Needling, Joint mobilization, Joint manipulation, Spinal manipulation, Spinal mobilization, Scar mobilization, and DME instructions. SABRA  PLAN FOR NEXT SESSION: Review HEP and goals   2:56 PM, 05/08/24 Lavera Vandermeer Small Hiedi Touchton MPT [facility] [place] [phone]

## 2024-05-09 ENCOUNTER — Telehealth (HOSPITAL_COMMUNITY): Payer: Self-pay

## 2024-05-09 ENCOUNTER — Ambulatory Visit (HOSPITAL_COMMUNITY)

## 2024-05-09 NOTE — Telephone Encounter (Signed)
 Called regarding missed appointment for physical therapy evaluation.  Unable to leave a message as voice mail box was not set up.  9:09 AM, 05/09/24 Jasmaine Rochel Small Johnn Krasowski MPT  physical therapy [place] [phone] [phone]

## 2024-05-12 ENCOUNTER — Other Ambulatory Visit: Payer: Self-pay | Admitting: Family Medicine

## 2024-06-09 ENCOUNTER — Telehealth: Payer: Self-pay | Admitting: Family Medicine

## 2024-06-09 ENCOUNTER — Other Ambulatory Visit: Payer: Self-pay | Admitting: Family Medicine

## 2024-06-09 MED ORDER — AMLODIPINE BESYLATE 10 MG PO TABS: 10.0000 mg | ORAL_TABLET | Freq: Every day | ORAL | 0 refills | Status: DC

## 2024-06-09 NOTE — Telephone Encounter (Signed)
 Sent to pharmacy

## 2024-06-09 NOTE — Telephone Encounter (Signed)
Unable to pend medication.

## 2024-06-09 NOTE — Telephone Encounter (Signed)
 Copied from CRM [id number]. Topic: Clinical - Medication Refill >> Jun 09, 2024  2:58 PM Shardie S wrote: Medication: amLODipine  (NORVASC ) 10 MG tablet  Has the patient contacted their pharmacy? Yes (Agent: If no, request that the patient contact the pharmacy for the refill. If patient does not wish to contact the pharmacy document the reason why and proceed with request.) (Agent: If yes, when and what did the pharmacy advise?)  This is the patient's preferred pharmacy:    [facility] - [place], [place] - [address] [place] [address] Phone: [phone] Fax: [phone]    Is this the correct pharmacy for this prescription? Yes If no, delete pharmacy and type the correct one.   Has the prescription been filled recently? No  Is the patient out of the medication? Yes  Has the patient been seen for an appointment in the last year OR does the patient have an upcoming appointment? Yes  Can we respond through MyChart? No  Agent: Please be advised that Rx refills may take up to 3 business days. We ask that you follow-up with your pharmacy.

## 2024-06-09 NOTE — Telephone Encounter (Signed)
 Copied from CRM #[id number]. Topic: Clinical - Medication Refill >> Jun 09, 2024  2:51 PM Rosaria E wrote: Medication:  amLODipine  (NORVASC ) 10 MG tablet   Has the patient contacted their pharmacy? Yes (Agent: If no, request that the patient contact the pharmacy for the refill. If patient does not wish to contact the pharmacy document the reason why and proceed with request.) (Agent: If yes, when and what did the pharmacy advise?)  This is the patient's preferred pharmacy:   [facility] - [place], [place] - [address] [place] [address] Phone: [phone] Fax: [phone]   Is this the correct pharmacy for this prescription? Yes If no, delete pharmacy and type the correct one.   Has the prescription been filled recently? Yes  Is the patient out of the medication? Pharmacy does not know  Has the patient been seen for an appointment in the last year OR does the patient have an upcoming appointment? Yes  Can we respond through MyChart? Yes  Agent: Please be advised that Rx refills may take up to 3 business days. We ask that you follow-up with your pharmacy.

## 2024-06-10 ENCOUNTER — Other Ambulatory Visit: Payer: Self-pay

## 2024-06-10 MED ORDER — AMLODIPINE BESYLATE 10 MG PO TABS: 10.0000 mg | ORAL_TABLET | Freq: Every day | ORAL | 0 refills | Status: DC

## 2024-06-15 ENCOUNTER — Other Ambulatory Visit: Payer: Self-pay | Admitting: Family Medicine

## 2024-06-17 DIAGNOSIS — Z5181 Encounter for therapeutic drug level monitoring: Secondary | ICD-10-CM | POA: Diagnosis not present

## 2024-06-22 ENCOUNTER — Other Ambulatory Visit: Payer: Self-pay | Admitting: Family Medicine

## 2024-06-24 DIAGNOSIS — Z5181 Encounter for therapeutic drug level monitoring: Secondary | ICD-10-CM | POA: Diagnosis not present

## 2024-06-27 ENCOUNTER — Other Ambulatory Visit: Payer: Self-pay | Admitting: Family Medicine

## 2024-06-27 NOTE — Telephone Encounter (Signed)
 Copied from CRM [id number]. Topic: Clinical - Medication Question >> Jun 27, 2024  1:24 PM Antwanette L wrote: Reason for CRM: Ronnald, a pharmacy technician with [facility], is calling to request a refill for amlodipine  (Norvasc ) 10 mg tablets. She is requesting a minimum of six refills on their end. [facility] provider line phone number is  [phone]. For call backs, the reference number is [phone]

## 2024-07-08 ENCOUNTER — Other Ambulatory Visit: Payer: Self-pay | Admitting: Family Medicine

## 2024-07-08 MED ORDER — AMLODIPINE BESYLATE 10 MG PO TABS: 10.0000 mg | ORAL_TABLET | Freq: Every day | ORAL | 0 refills | Status: DC

## 2024-07-08 NOTE — Telephone Encounter (Signed)
 Copied from CRM #[id number]. Topic: Clinical - Medication Refill >> Jul 08, 2024  9:22 AM Tobias L wrote: Medication: amLODipine  (NORVASC ) 10 MG tablet  Has the patient contacted their pharmacy? Yes Told to reach out to the office for refill.   This is the patient's preferred pharmacy:  [facility] - [place], [place] - [address] [place] [address] Phone: [phone] Fax: [phone]  Is this the correct pharmacy for this prescription? Yes  Has the prescription been filled recently? No  Is the patient out of the medication? No  Has the patient been seen for an appointment in the last year OR does the patient have an upcoming appointment? Yes  Can we respond through MyChart? No  Agent: Please be advised that Rx refills may take up to 3 business days. We ask that you follow-up with your pharmacy.

## 2024-07-10 ENCOUNTER — Ambulatory Visit: Payer: Self-pay | Admitting: Physician Assistant

## 2024-07-15 ENCOUNTER — Other Ambulatory Visit: Payer: Self-pay | Admitting: Internal Medicine

## 2024-07-17 ENCOUNTER — Ambulatory Visit: Admitting: Family Medicine

## 2024-07-22 ENCOUNTER — Other Ambulatory Visit: Payer: Self-pay | Admitting: Family Medicine

## 2024-07-28 ENCOUNTER — Ambulatory Visit: Admitting: Orthopedic Surgery

## 2024-08-04 ENCOUNTER — Ambulatory Visit: Admitting: Orthopedic Surgery

## 2024-08-04 ENCOUNTER — Other Ambulatory Visit: Payer: Self-pay

## 2024-08-04 ENCOUNTER — Encounter: Payer: Self-pay | Admitting: Orthopedic Surgery

## 2024-08-04 ENCOUNTER — Other Ambulatory Visit (INDEPENDENT_AMBULATORY_CARE_PROVIDER_SITE_OTHER)

## 2024-08-04 DIAGNOSIS — M25561 Pain in right knee: Secondary | ICD-10-CM

## 2024-08-04 DIAGNOSIS — G8929 Other chronic pain: Secondary | ICD-10-CM

## 2024-08-04 DIAGNOSIS — M170 Bilateral primary osteoarthritis of knee: Secondary | ICD-10-CM

## 2024-08-04 DIAGNOSIS — M1711 Unilateral primary osteoarthritis, right knee: Secondary | ICD-10-CM

## 2024-08-04 DIAGNOSIS — M65969 Unspecified synovitis and tenosynovitis, unspecified lower leg: Secondary | ICD-10-CM

## 2024-08-04 DIAGNOSIS — M25562 Pain in left knee: Secondary | ICD-10-CM | POA: Diagnosis not present

## 2024-08-04 MED ORDER — GABAPENTIN 300 MG PO CAPS: 300.0000 mg | ORAL_CAPSULE | Freq: Three times a day (TID) | ORAL | 5 refills | Status: AC

## 2024-08-04 MED ORDER — PREDNISONE 10 MG PO TABS: 10.0000 mg | ORAL_TABLET | Freq: Three times a day (TID) | ORAL | 0 refills | Status: DC

## 2024-08-04 NOTE — Patient Instructions (Signed)
 Apply ice to the knee when needed to help with swelling  Take the medication as prescribed for pain and swelling

## 2024-08-04 NOTE — Progress Notes (Signed)
    Chief Complaint  Patient presents with   Knee Pain    Right greater than left knee pain    58 year old female basically comes in because she fell and landed on both her knees had trouble walking initially.  She had initial swelling in the right knee and some pain in the left knee but the left knee pain was mild  She treated it with rest and elevation and some of the swelling went down   However she complains of chronically swollen right knee and it interferes with her walking she says I can go anywhere  Ibuprofen  she says is not working  The pain sometimes radiates up into the right lower back and buttock   Physical Exam Nursing note reviewed.  Constitutional:      Appearance: Normal appearance.  Skin:    General: Skin is warm.     Capillary Refill: Capillary refill takes less than 2 seconds.  Neurological:     Mental Status: She is alert and oriented to person, place, and time.     Sensory: No sensory deficit.     Motor: No weakness.     Gait: Gait abnormal.  Psychiatric:        Behavior: Behavior normal.    Right knee synovitis no effusion tenderness on the medial compartment Passive range of motion is within normal limits  Left knee no pain tenderness swelling or synovitis  DG Knee AP/LAT W/Sunrise Left Result Date: 08/04/2024 See report of left knee but the left knee basically has narrowing of the medial compartment a small osteophyte seen laterally there is definitive joint space narrowing there is no fracture or effusion impression is osteoarthritis grade 3 left knee   DG Knee AP/LAT W/Sunrise Right Result Date: 08/04/2024 Status post fall right knee x-ray x-rays show narrowing medial compartment and narrowing of the medial compartment of the left knee the right is worse I barely see osteophytes Since there is joint space narrowing this would be grade 3 disease in both legs     Assessment and plan  Encounter Diagnoses  Name Primary?   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 needs to be on an NSAID I talked to her about knee replacement and she said she has a dog at home and no one to help her  She asked for motorized chair but I said she would be better off having medication to keep the swelling down and then she should be able to ambulate   She has a history of lumbar disc disease as well which is contributing to some of her radicular symptoms which she described  Recommend rest and then progressive increase in activity and the following medications   Meds ordered this encounter  Medications   predniSONE  (DELTASONE ) 10 MG tablet    Sig: Take 1 tablet (10 mg total) by mouth 3 (three) times daily for 28 days.    Dispense:  84 tablet    Refill:  0   gabapentin  (NEURONTIN ) 300 MG capsule    Sig: Take 1 capsule (300 mg total) by mouth 3 (three) times daily.    Dispense:  90 capsule    Refill:  5

## 2024-08-04 NOTE — Progress Notes (Signed)
    08/04/2024   Chief Complaint  Patient presents with   Knee Pain    Right greater than left knee pain    No diagnosis found.  What pharmacy do you use ? _____[facility] ______________________  DOI/DOS/ Date: ongoing  Did you get better, worse or no change (Answer below)   Worse/fell a week ago with increased pain both knees right is worse than left

## 2024-08-11 LAB — HM MAMMOGRAPHY

## 2024-08-12 ENCOUNTER — Encounter: Payer: Self-pay | Admitting: Internal Medicine

## 2024-08-18 ENCOUNTER — Encounter: Payer: Self-pay | Admitting: Internal Medicine

## 2024-08-18 ENCOUNTER — Ambulatory Visit: Payer: Self-pay | Admitting: Internal Medicine

## 2024-08-18 VITALS — BP 128/85 | HR 79 | Ht 63.0 in | Wt 180.6 lb

## 2024-08-18 DIAGNOSIS — I10 Essential (primary) hypertension: Secondary | ICD-10-CM | POA: Diagnosis not present

## 2024-08-18 DIAGNOSIS — K219 Gastro-esophageal reflux disease without esophagitis: Secondary | ICD-10-CM | POA: Diagnosis not present

## 2024-08-18 DIAGNOSIS — Z23 Encounter for immunization: Secondary | ICD-10-CM

## 2024-08-18 DIAGNOSIS — M1711 Unilateral primary osteoarthritis, right knee: Secondary | ICD-10-CM | POA: Insufficient documentation

## 2024-08-18 DIAGNOSIS — F339 Major depressive disorder, recurrent, unspecified: Secondary | ICD-10-CM

## 2024-08-18 DIAGNOSIS — G4700 Insomnia, unspecified: Secondary | ICD-10-CM

## 2024-08-18 MED ORDER — HYDROXYZINE PAMOATE 25 MG PO CAPS: 25.0000 mg | ORAL_CAPSULE | Freq: Every evening | ORAL | 3 refills | Status: AC | PRN

## 2024-08-18 NOTE — Assessment & Plan Note (Signed)
 Uncontrolled, likely due to grief reaction Will continue mirtazapine  for now Added hydroxyzine for GAD and insomnia Continue [facility] therapy through [facility]

## 2024-08-18 NOTE — Assessment & Plan Note (Signed)
 BP Readings from Last 1 Encounters:  08/18/24 128/85   Well-controlled with amlodipine  10 mg QD Counseled for compliance with the medications Advised DASH diet and moderate exercise/walking as tolerated

## 2024-08-18 NOTE — Patient Instructions (Addendum)
 Please start taking Hydroxyzine for insomnia.  Please continue to take other medications as prescribed.  Please continue to follow low carb diet and perform moderate exercise/walking at least 150 mins/week.

## 2024-08-18 NOTE — Assessment & Plan Note (Signed)
 Will controlled with pantoprazole  40 mg BID

## 2024-08-18 NOTE — Assessment & Plan Note (Signed)
 Continue Remeron  30 mg nightly Has persistent insomnia, advised to go to bed slightly later - currently goes to bed at 8 PM Sleep hygiene discussed Hydroxyzine  as needed for anxiety and insomnia

## 2024-08-18 NOTE — Progress Notes (Signed)
 Established Patient Office Visit  Subjective:  Patient ID: Kirsten Walls, female    DOB: 13-Apr-1966  Age: 58 y.o. MRN: [id number]  CC:  Chief Complaint  Patient presents with   Follow-up    Follow up     HPI Kirsten Walls is a 58 y.o. female with past medical history of HTN, GERD, MDD, GAD, insomnia and OA of knee who presents for f/u of her chronic medical conditions.  HTN: BP is well-controlled. Takes medications regularly. Patient denies headache, dizziness, chest pain, dyspnea or palpitations.  GERD: Takes pantoprazole  40 mg BID currently.  Denies dysphagia, odynophagia, nausea or vomiting currently  MDD, GAD and insomnia: She has been more stressed since loss of her daughter about 2 months ago.  She feels depressed and has insomnia.  [facility] [place], where she spends about 4 hours for 5 days in a week.  Past Medical History:  Diagnosis Date   Arthritis    Asthma    Bronchitis    Chronic shoulder pain    Epistaxis    Hypertension    Insomnia    Seizures (HCC)     Past Surgical History:  Procedure Laterality Date   ABDOMINAL HYSTERECTOMY     BUNIONECTOMY  both big toe   CESAREAN SECTION  x2   COLONOSCOPY N/A 08/18/2015   Procedure: COLONOSCOPY;  Surgeon: Lamar CHRISTELLA Hollingshead, MD;  Location: [facility];  Service: Endoscopy;  Laterality: N/A;  1000   FOOT SURGERY     KNEE SURGERY     SHOULDER ARTHROSCOPY     TUMOR REMOVAL  left knee cap    Family History  Problem Relation Age of Onset   Hypertension Mother    Hyperlipidemia Mother    Diabetes Mother    Asthma Other    Colon cancer Neg Hx     Social History   Socioeconomic History   Marital status: Single    Spouse name: Not on file   Number of children: 3   Years of education: Not on file   Highest education level: Not on file  Occupational History   Not on file  Tobacco Use   Smoking status: Some Days    Types: Cigarettes    Smokeless tobacco: Never   Tobacco comments:    Only smokes when she drinks   Vaping Use   Vaping status: Former  Substance and Sexual Activity   Alcohol  use: Yes    Comment: social   Drug use: No   Sexual activity: Not Currently    Birth control/protection: Surgical    Comment: hyst  Other Topics Concern   Not on file  Social History Narrative   Patient wants to take classes to learn how to spell and read better so she can get her GED. Referral placed today to assist patient with reaching these goals.    Social Drivers of Corporate Investment Banker Strain: Low Risk  (12/31/2023)   Overall Financial Resource Strain (CARDIA)    Difficulty of Paying Living Expenses: Not hard at all  Food Insecurity: No Food Insecurity (12/31/2023)   Hunger Vital Sign    Worried About Running Out of Food in the Last Year: Never true    Ran Out of Food in the Last Year: Never true  Transportation Needs: No Transportation Needs (12/31/2023)   PRAPARE - Administrator, Civil Service (Medical): No    Lack of Transportation (Non-Medical): No  Physical Activity: Patient Declined (12/31/2023)   Exercise Vital Sign    Days of Exercise per Week: Patient declined    Minutes of Exercise per Session: Patient declined  Stress: No Stress Concern Present (12/31/2023)   [employer] of Occupational Health - Occupational Stress Questionnaire    Feeling of Stress : Not at all  Social Connections: Unknown (12/31/2023)   Social Connection and Isolation Panel    Frequency of Communication with Friends and Family: More than three times a week    Frequency of Social Gatherings with Friends and Family: More than three times a week    Attends Religious Services: More than 4 times per year    Active Member of [employer] or Organizations: No    Attends Banker Meetings: Never    Marital Status: Patient declined  Catering Manager Violence: Not At Risk (12/31/2023)   Humiliation, Afraid, Rape, and  Kick questionnaire    Fear of Current or Ex-Partner: No    Emotionally Abused: No    Physically Abused: No    Sexually Abused: No    Outpatient Medications Prior to Visit  Medication Sig Dispense Refill   albuterol  (VENTOLIN  HFA) 108 (90 Base) MCG/ACT inhaler Inhale 1 puff by mouth four times a day as needed 8.5 each 11   amLODipine  (NORVASC ) 10 MG tablet Take 1 tablet by mouth daily 30 tablet 11   brimonidine (ALPHAGAN) 0.2 % ophthalmic solution 1 drop 2 (two) times daily.     cyclobenzaprine  (FLEXERIL ) 5 MG tablet Take 1 tablet (5 mg total) by mouth 2 (two) times daily as needed. 60 tablet 3   EPINEPHrine  (EPIPEN  2-PAK) 0.3 mg/0.3 mL IJ SOAJ injection Inject 0.3 mLs (0.3 mg total) into the muscle as needed for anaphylaxis. 2 Device 0   Eyelid Cleansers (SYSTANE LID WIPES) PADS Apply 1 drop topically daily with breakfast.     gabapentin  (NEURONTIN ) 300 MG capsule Take 1 capsule (300 mg total) by mouth 3 (three) times daily. 90 capsule 5   meloxicam  (MOBIC ) 7.5 MG tablet Take 1 tablet (7.5 mg total) by mouth daily. 30 tablet 5   mirtazapine  (REMERON  SOL-TAB) 30 MG disintegrating tablet DISSOLVE ONE TABLET BY MOUTH AT BEDTIME 30 tablet 3   montelukast  (SINGULAIR ) 10 MG tablet Take 1 tablet by mouth every evening 30 tablet 11   pantoprazole  (PROTONIX ) 40 MG tablet Take 1 tablet by mouth twice daily 60 tablet 11   predniSONE  (DELTASONE ) 10 MG tablet Take 1 tablet (10 mg total) by mouth 3 (three) times daily for 28 days. 84 tablet 0   ibuprofen  (ADVIL ) 800 MG tablet Take 1 tablet by mouth every day as needed 30 tablet 11   polycarbophil (FIBERCON) 625 MG tablet Take 1 tablet (625 mg total) by mouth daily. (Patient not taking: Reported on 02/28/2024) 90 tablet 1   Cholecalciferol  125 MCG (5000 UT) capsule Take by mouth. (Patient not taking: Reported on 02/28/2024)     clobetasol (TEMOVATE) 0.05 % external solution Apply topically. (Patient not taking: Reported on 02/28/2024)     esomeprazole  (NEXIUM) 40 MG capsule Take by mouth. (Patient not taking: Reported on 02/28/2024)     GOODSENSE CLEARLAX 17 GM/SCOOP powder MIX 1 CAPFUL (17 GRAMS) WITH 8OZ OF WATER  OR JUICE DAILY. (Patient not taking: Reported on 01/10/2024) 238 g 0   Hydrocortisone, Perianal, 1 % CREA Apply topically. (Patient not taking: Reported on 01/10/2024)     Multiple Vitamin (MULTIVITAMIN PO) Take 1 tablet by mouth daily. (Patient not  taking: Reported on 02/28/2024)     oxymetazoline  (NASAL RELIEF) 0.05 % nasal spray 1 spray 2 (two) times a day. (Patient not taking: Reported on 02/28/2024)     simethicone  (GAS-X) 80 MG chewable tablet Chew 1 tablet (80 mg total) by mouth every 6 (six) hours as needed for flatulence. (Patient not taking: Reported on 02/28/2024) 30 tablet 3   topiramate  (TOPAMAX ) 25 MG tablet Take 100 mg by mouth daily. (Patient not taking: Reported on 02/28/2024)     triamcinolone  cream (KENALOG ) 0.1 % Apply 1 Application topically 2 (two) times daily. (Patient not taking: Reported on 02/28/2024) 60 g 1   Vitamin D , Ergocalciferol , (DRISDOL) 1.25 MG (50000 UNIT) CAPS capsule Take by mouth. (Patient not taking: Reported on 02/28/2024)     vitamin E 180 MG (400 UNITS) capsule Take 400 Units by mouth daily. (Patient not taking: Reported on 02/28/2024)     White Petrolatum-Mineral Oil (ARTIFICIAL TEARS) ointment Place 2 drops into both eyes as needed. (Patient not taking: Reported on 02/28/2024)     No facility-administered medications prior to visit.    Allergies  Allergen Reactions   Penicillins Hives    Did it involve swelling of the face/tongue/throat, SOB, or low BP? Yes Did it involve sudden or severe rash/hives, skin peeling, or any reaction on the inside of your mouth or nose? No Did you need to seek medical attention at a hospital or doctor's office? Yes When did it last happen?  Over 10 years ago-childhood If all above answers are "NO", may proceed with cephalosporin use.    Topiramate  Itching     Causes itching while out in the sun.    Azithromycin Rash   Sulfamethoxazole -Trimethoprim  Hives and Rash    ROS Review of Systems  Constitutional:  Negative for chills and fever.  HENT:  Negative for congestion, sinus pressure, sinus pain and sore throat.   Eyes:  Negative for pain and discharge.  Respiratory:  Negative for cough and shortness of breath.   Cardiovascular:  Negative for chest pain and palpitations.  Gastrointestinal:  Negative for abdominal pain, diarrhea, nausea and vomiting.  Endocrine: Negative for polydipsia and polyuria.  Genitourinary:  Negative for dysuria and hematuria.  Musculoskeletal:  Positive for back pain. Negative for neck pain and neck stiffness.  Skin:  Negative for rash.  Neurological:  Negative for dizziness and weakness.  Psychiatric/Behavioral:  Positive for dysphoric mood and sleep disturbance. Negative for agitation and behavioral problems. The patient is nervous/anxious.       Objective:    Physical Exam Vitals reviewed.  Constitutional:      General: She is not in acute distress.    Appearance: She is not diaphoretic.  HENT:     Head: Normocephalic and atraumatic.     Nose: Nose normal. No congestion.     Mouth/Throat:     Mouth: Mucous membranes are moist.     Pharynx: No posterior oropharyngeal erythema.  Eyes:     General: No scleral icterus.    Extraocular Movements: Extraocular movements intact.  Cardiovascular:     Rate and Rhythm: Normal rate and regular rhythm.     Heart sounds: Normal heart sounds. No murmur heard. Pulmonary:     Breath sounds: Normal breath sounds. No wheezing or rales.  Musculoskeletal:     Cervical back: Neck supple. No tenderness.     Right lower leg: No edema.     Left lower leg: No edema.  Skin:    General: Skin is  warm.     Findings: No rash.  Neurological:     General: No focal deficit present.     Mental Status: She is alert and oriented to person, place, and time.  Psychiatric:         Mood and Affect: Mood is depressed.        Behavior: Behavior is cooperative.     BP 128/85   Pulse 79   Ht 5' 3 (1.6 m)   Wt 180 lb 9.6 oz (81.9 kg)   SpO2 94%   BMI 31.99 kg/m  Wt Readings from Last 3 Encounters:  08/18/24 180 lb 9.6 oz (81.9 kg)  03/23/24 172 lb (78 kg)  02/28/24 177 lb (80.3 kg)    Lab Results  Component Value Date   TSH 0.945 01/11/2024   Lab Results  Component Value Date   WBC 6.8 01/11/2024   HGB 12.4 01/11/2024   HCT 38.3 01/11/2024   MCV 94 01/11/2024   PLT 339 01/11/2024   Lab Results  Component Value Date   NA 140 01/11/2024   K 4.0 01/11/2024   CO2 22 01/11/2024   GLUCOSE 98 01/11/2024   BUN 16 01/11/2024   CREATININE 0.77 01/11/2024   BILITOT 0.3 01/11/2024   ALKPHOS 99 01/11/2024   AST 31 01/11/2024   ALT 22 01/11/2024   PROT 6.8 01/11/2024   ALBUMIN 4.2 01/11/2024   CALCIUM 9.7 01/11/2024   ANIONGAP 7 10/18/2022   EGFR 89 01/11/2024   Lab Results  Component Value Date   CHOL 152 01/11/2024   Lab Results  Component Value Date   HDL 66 01/11/2024   Lab Results  Component Value Date   LDLCALC 56 01/11/2024   Lab Results  Component Value Date   TRIG 188 (H) 01/11/2024   Lab Results  Component Value Date   CHOLHDL 2.3 01/11/2024   Lab Results  Component Value Date   HGBA1C 6.0 (H) 01/11/2024      Assessment & Plan:   Problem List Items Addressed This Visit       Cardiovascular and Mediastinum   Essential hypertension - Primary   BP Readings from Last 1 Encounters:  08/18/24 128/85   Well-controlled with amlodipine  10 mg QD Counseled for compliance with the medications Advised DASH diet and moderate exercise/walking as tolerated        Digestive   GERD (gastroesophageal reflux disease)   Will controlled with pantoprazole  40 mg BID        Musculoskeletal and Integument   Primary osteoarthritis of right knee   Currently on oral prednisone , followed by orthopedic surgery Has meloxicam  7.5 mg QD  as well, discontinue ibuprofen  (has not been taking it anyways) On gabapentin  300 mg 3 times daily        Other   Insomnia   Continue Remeron  30 mg nightly Has persistent insomnia, advised to go to bed slightly later - currently goes to bed at 8 PM Sleep hygiene discussed Hydroxyzine  as needed for anxiety and insomnia      Relevant Medications   hydrOXYzine  (VISTARIL ) 25 MG capsule   Depression, recurrent   Uncontrolled, likely due to grief reaction Will continue mirtazapine  for now Added hydroxyzine  for GAD and insomnia Continue [facility]      Relevant Medications   hydrOXYzine  (VISTARIL ) 25 MG capsule   Other Visit Diagnoses       Encounter for immunization       Relevant Orders  Flu vaccine trivalent PF, 6mos and older(Flulaval,Afluria,Fluarix,Fluzone) (Completed)       Meds ordered this encounter  Medications   hydrOXYzine (VISTARIL) 25 MG capsule    Sig: Take 1 capsule (25 mg total) by mouth at bedtime as needed for anxiety (Insomnia).    Dispense:  30 capsule    Refill:  3    Follow-up: Return in about 4 months (around 12/17/2024) for MDD.    Suzzane MARLA Blanch, MD

## 2024-08-18 NOTE — Assessment & Plan Note (Signed)
 Currently on oral prednisone , followed by orthopedic surgery Has meloxicam  7.5 mg QD as well, discontinue ibuprofen  (has not been taking it anyways) On gabapentin  300 mg 3 times daily

## 2024-08-22 ENCOUNTER — Other Ambulatory Visit: Payer: Self-pay | Admitting: Family Medicine

## 2024-08-29 ENCOUNTER — Other Ambulatory Visit: Payer: Self-pay | Admitting: Family Medicine

## 2024-08-29 DIAGNOSIS — R55 Syncope and collapse: Secondary | ICD-10-CM

## 2024-09-06 ENCOUNTER — Other Ambulatory Visit: Payer: Self-pay | Admitting: Orthopedic Surgery

## 2024-09-06 DIAGNOSIS — M1711 Unilateral primary osteoarthritis, right knee: Secondary | ICD-10-CM

## 2024-09-09 LAB — OPHTHALMOLOGY REPORT-SCANNED

## 2024-09-24 ENCOUNTER — Other Ambulatory Visit: Payer: Self-pay | Admitting: Orthopedic Surgery

## 2024-09-24 DIAGNOSIS — M65969 Unspecified synovitis and tenosynovitis, unspecified lower leg: Secondary | ICD-10-CM

## 2024-10-15 ENCOUNTER — Other Ambulatory Visit: Payer: Self-pay | Admitting: Family Medicine

## 2024-10-15 DIAGNOSIS — R55 Syncope and collapse: Secondary | ICD-10-CM

## 2024-10-23 ENCOUNTER — Ambulatory Visit: Admitting: Family Medicine

## 2024-12-08 ENCOUNTER — Ambulatory Visit: Payer: Self-pay

## 2024-12-22 ENCOUNTER — Ambulatory Visit: Admitting: Internal Medicine

## 2024-12-31 ENCOUNTER — Ambulatory Visit
